# Patient Record
Sex: Female | Born: 1960 | Race: Black or African American | Hispanic: No | Marital: Single | Smoking: Never smoker
Health system: Southern US, Community
[De-identification: ages and names within clinical notes are randomized; demographics above are authoritative.]

## PROBLEM LIST (undated history)

## (undated) DIAGNOSIS — F32A Depression, unspecified: Secondary | ICD-10-CM

## (undated) DIAGNOSIS — T7840XA Allergy, unspecified, initial encounter: Secondary | ICD-10-CM

## (undated) DIAGNOSIS — R7303 Prediabetes: Secondary | ICD-10-CM

## (undated) DIAGNOSIS — I219 Acute myocardial infarction, unspecified: Secondary | ICD-10-CM

## (undated) DIAGNOSIS — I10 Essential (primary) hypertension: Secondary | ICD-10-CM

## (undated) DIAGNOSIS — C801 Malignant (primary) neoplasm, unspecified: Secondary | ICD-10-CM

## (undated) DIAGNOSIS — F329 Major depressive disorder, single episode, unspecified: Secondary | ICD-10-CM

## (undated) DIAGNOSIS — K219 Gastro-esophageal reflux disease without esophagitis: Secondary | ICD-10-CM

## (undated) DIAGNOSIS — G4733 Obstructive sleep apnea (adult) (pediatric): Secondary | ICD-10-CM

## (undated) DIAGNOSIS — G4730 Sleep apnea, unspecified: Secondary | ICD-10-CM

## (undated) DIAGNOSIS — E119 Type 2 diabetes mellitus without complications: Secondary | ICD-10-CM

## (undated) DIAGNOSIS — R011 Cardiac murmur, unspecified: Secondary | ICD-10-CM

## (undated) DIAGNOSIS — M170 Bilateral primary osteoarthritis of knee: Secondary | ICD-10-CM

## (undated) DIAGNOSIS — H269 Unspecified cataract: Secondary | ICD-10-CM

## (undated) DIAGNOSIS — N189 Chronic kidney disease, unspecified: Secondary | ICD-10-CM

## (undated) HISTORY — DX: Allergy, unspecified, initial encounter: T78.40XA

## (undated) HISTORY — PX: TUBAL LIGATION: SHX77

## (undated) HISTORY — PX: JOINT REPLACEMENT: SHX530

## (undated) HISTORY — DX: Sleep apnea, unspecified: G47.30

## (undated) HISTORY — DX: Malignant (primary) neoplasm, unspecified: C80.1

## (undated) HISTORY — PX: TOTAL KNEE ARTHROPLASTY: SHX125

## (undated) HISTORY — PX: ANKLE SURGERY: SHX546

## (undated) HISTORY — PX: EYE SURGERY: SHX253

## (undated) HISTORY — PX: MEDIAL PARTIAL KNEE REPLACEMENT: SHX5965

## (undated) HISTORY — PX: BREAST SURGERY: SHX581

---

## 2004-08-26 ENCOUNTER — Emergency Department: Payer: Self-pay | Admitting: Emergency Medicine

## 2004-09-28 ENCOUNTER — Ambulatory Visit: Payer: Self-pay | Admitting: Specialist

## 2005-09-17 ENCOUNTER — Emergency Department: Payer: Self-pay | Admitting: Emergency Medicine

## 2006-07-22 ENCOUNTER — Emergency Department: Payer: Self-pay | Admitting: Emergency Medicine

## 2006-11-14 ENCOUNTER — Emergency Department: Payer: Self-pay | Admitting: General Practice

## 2007-02-27 ENCOUNTER — Ambulatory Visit: Payer: Self-pay | Admitting: Specialist

## 2007-03-07 ENCOUNTER — Emergency Department: Payer: Self-pay | Admitting: Internal Medicine

## 2007-04-17 ENCOUNTER — Emergency Department: Payer: Self-pay | Admitting: Emergency Medicine

## 2007-05-31 ENCOUNTER — Other Ambulatory Visit: Payer: Self-pay

## 2007-05-31 ENCOUNTER — Ambulatory Visit: Payer: Self-pay | Admitting: Specialist

## 2007-06-05 ENCOUNTER — Ambulatory Visit: Payer: Self-pay | Admitting: Specialist

## 2007-10-18 ENCOUNTER — Other Ambulatory Visit: Payer: Self-pay

## 2007-10-18 ENCOUNTER — Emergency Department: Payer: Self-pay | Admitting: Emergency Medicine

## 2007-12-19 DIAGNOSIS — M1710 Unilateral primary osteoarthritis, unspecified knee: Secondary | ICD-10-CM | POA: Insufficient documentation

## 2009-06-14 ENCOUNTER — Emergency Department: Payer: Self-pay | Admitting: Emergency Medicine

## 2010-01-21 ENCOUNTER — Ambulatory Visit: Payer: Self-pay | Admitting: Internal Medicine

## 2011-10-25 DIAGNOSIS — I152 Hypertension secondary to endocrine disorders: Secondary | ICD-10-CM | POA: Diagnosis present

## 2011-10-25 DIAGNOSIS — I10 Essential (primary) hypertension: Secondary | ICD-10-CM | POA: Insufficient documentation

## 2013-05-22 HISTORY — PX: COLONOSCOPY: SHX174

## 2013-12-22 ENCOUNTER — Ambulatory Visit: Payer: Self-pay | Admitting: Internal Medicine

## 2013-12-26 ENCOUNTER — Ambulatory Visit: Payer: Self-pay | Admitting: Internal Medicine

## 2014-01-07 DIAGNOSIS — Z6841 Body Mass Index (BMI) 40.0 and over, adult: Secondary | ICD-10-CM | POA: Insufficient documentation

## 2014-02-17 ENCOUNTER — Ambulatory Visit: Payer: Self-pay

## 2014-04-29 ENCOUNTER — Emergency Department: Payer: Self-pay | Admitting: Emergency Medicine

## 2014-04-29 LAB — URINALYSIS, COMPLETE
Bacteria: NONE SEEN
Bilirubin,UR: NEGATIVE
Glucose,UR: NEGATIVE mg/dL (ref 0–75)
Ketone: NEGATIVE
LEUKOCYTE ESTERASE: NEGATIVE
Nitrite: NEGATIVE
Ph: 5 (ref 4.5–8.0)
Protein: NEGATIVE
SPECIFIC GRAVITY: 1.016 (ref 1.003–1.030)

## 2014-06-03 DIAGNOSIS — F32A Depression, unspecified: Secondary | ICD-10-CM | POA: Insufficient documentation

## 2014-06-03 DIAGNOSIS — F329 Major depressive disorder, single episode, unspecified: Secondary | ICD-10-CM | POA: Insufficient documentation

## 2014-06-21 ENCOUNTER — Emergency Department: Payer: Self-pay | Admitting: Emergency Medicine

## 2014-06-21 LAB — COMPREHENSIVE METABOLIC PANEL
Albumin: 3.3 g/dL — ABNORMAL LOW (ref 3.4–5.0)
Alkaline Phosphatase: 103 U/L (ref 46–116)
Anion Gap: 6 — ABNORMAL LOW (ref 7–16)
BUN: 19 mg/dL — ABNORMAL HIGH (ref 7–18)
Bilirubin,Total: 0.2 mg/dL (ref 0.2–1.0)
Calcium, Total: 9.1 mg/dL (ref 8.5–10.1)
Chloride: 103 mmol/L (ref 98–107)
Co2: 30 mmol/L (ref 21–32)
Creatinine: 1.23 mg/dL (ref 0.60–1.30)
EGFR (African American): 59 — ABNORMAL LOW
EGFR (Non-African Amer.): 49 — ABNORMAL LOW
Glucose: 137 mg/dL — ABNORMAL HIGH (ref 65–99)
Osmolality: 282 (ref 275–301)
Potassium: 3 mmol/L — ABNORMAL LOW (ref 3.5–5.1)
SGOT(AST): 44 U/L — ABNORMAL HIGH (ref 15–37)
SGPT (ALT): 26 U/L (ref 14–63)
Sodium: 139 mmol/L (ref 136–145)
Total Protein: 8.4 g/dL — ABNORMAL HIGH (ref 6.4–8.2)

## 2014-06-21 LAB — CBC
HCT: 38.8 % (ref 35.0–47.0)
HGB: 12.7 g/dL (ref 12.0–16.0)
MCH: 28.7 pg (ref 26.0–34.0)
MCHC: 32.8 g/dL (ref 32.0–36.0)
MCV: 88 fL (ref 80–100)
Platelet: 174 10*3/uL (ref 150–440)
RBC: 4.43 10*6/uL (ref 3.80–5.20)
RDW: 14.2 % (ref 11.5–14.5)
WBC: 7.3 10*3/uL (ref 3.6–11.0)

## 2014-06-21 LAB — PROTIME-INR
INR: 1
Prothrombin Time: 13.4 secs

## 2014-06-21 LAB — D-DIMER(ARMC): D-Dimer: 1214 ng/ml

## 2014-06-21 LAB — TROPONIN I
Troponin-I: <0.02 ng/mL
Troponin-I: <0.02 ng/mL

## 2014-06-21 LAB — CK TOTAL AND CKMB (NOT AT ARMC)
CK, Total: 97 U/L (ref 26–192)
CK-MB: 0.8 ng/mL (ref 0.5–3.6)

## 2014-06-21 LAB — APTT: Activated PTT: 25.8 secs (ref 23.6–35.9)

## 2014-08-05 ENCOUNTER — Emergency Department: Payer: Self-pay | Admitting: Emergency Medicine

## 2014-09-24 ENCOUNTER — Other Ambulatory Visit: Payer: Self-pay | Admitting: Internal Medicine

## 2014-09-24 DIAGNOSIS — Z1239 Encounter for other screening for malignant neoplasm of breast: Secondary | ICD-10-CM

## 2014-09-29 ENCOUNTER — Encounter: Payer: Self-pay | Admitting: Emergency Medicine

## 2014-09-29 ENCOUNTER — Emergency Department: Payer: Medicare Other

## 2014-09-29 ENCOUNTER — Emergency Department
Admission: EM | Admit: 2014-09-29 | Discharge: 2014-09-29 | Disposition: A | Discharge status: Home / Self Care | Payer: Medicare Other | Attending: Emergency Medicine | Admitting: Emergency Medicine

## 2014-09-29 DIAGNOSIS — J180 Bronchopneumonia, unspecified organism: Secondary | ICD-10-CM | POA: Insufficient documentation

## 2014-09-29 DIAGNOSIS — I10 Essential (primary) hypertension: Secondary | ICD-10-CM | POA: Insufficient documentation

## 2014-09-29 DIAGNOSIS — R61 Generalized hyperhidrosis: Secondary | ICD-10-CM

## 2014-09-29 DIAGNOSIS — J029 Acute pharyngitis, unspecified: Secondary | ICD-10-CM | POA: Diagnosis present

## 2014-09-29 HISTORY — DX: Essential (primary) hypertension: I10

## 2014-09-29 MED ORDER — BENZONATATE 100 MG PO CAPS
ORAL_CAPSULE | ORAL | Status: AC
  Filled 2014-09-29: qty 2

## 2014-09-29 MED ORDER — PSEUDOEPH-BROMPHEN-DM 30-2-10 MG/5ML PO SYRP: 5.0000 mL | ORAL_SOLUTION | Freq: Four times a day (QID) | ORAL | Status: DC | PRN

## 2014-09-29 MED ORDER — ALBUTEROL SULFATE HFA 108 (90 BASE) MCG/ACT IN AERS: 2.0000 | INHALATION_SPRAY | Freq: Four times a day (QID) | RESPIRATORY_TRACT | Status: DC | PRN

## 2014-09-29 MED ORDER — BENZONATATE 100 MG PO CAPS
200.0000 mg | ORAL_CAPSULE | Freq: Once | ORAL | Status: AC
  Administered 2014-09-29: 200 mg via ORAL

## 2014-09-29 MED ORDER — AZITHROMYCIN 500 MG PO TABS: 500.0000 mg | ORAL_TABLET | Freq: Every day | ORAL | Status: AC

## 2014-09-29 NOTE — ED Notes (Signed)
Pt to ed with c/o body aches, chills, sore throat and cough since Friday.

## 2014-09-29 NOTE — Discharge Instructions (Signed)
Pneumonia, Adult Pneumonia is an infection of the lungs. It may be caused by a germ (virus or bacteria). Some types of pneumonia can spread easily from person to person. This can happen when you cough or sneeze. HOME CARE  Only take medicine as told by your doctor.  Take your medicine (antibiotics) as told. Finish it even if you start to feel better.  Do not smoke.  You may use a vaporizer or humidifier in your room. This can help loosen thick spit (mucus).  Sleep so you are almost sitting up (semi-upright). This helps reduce coughing.  Rest. A shot (vaccine) can help prevent pneumonia. Shots are often advised for:  People over 53 years old.  Patients on chemotherapy.  People with long-term (chronic) lung problems.  People with immune system problems. GET HELP RIGHT AWAY IF:   You are getting worse.  You cannot control your cough, and you are losing sleep.  You cough up blood.  Your pain gets worse, even with medicine.  You have a fever.  Any of your problems are getting worse, not better.  You have shortness of breath or chest pain. MAKE SURE YOU:   Understand these instructions.  Will watch your condition.  Will get help right away if you are not doing well or get worse. Document Released: 10/25/2007 Document Revised: 07/31/2011 Document Reviewed: 07/29/2010 [facility] Patient Information 2015 [place], [place]. This information is not intended to replace advice given to you by your health care provider. Make sure you discuss any questions you have with your health care provider.  Pneumonia, Adult Pneumonia is an infection of the lungs. It may be caused by a germ (virus or bacteria). Some types of pneumonia can spread easily from person to person. This can happen when you cough or sneeze. HOME CARE  Only take medicine as told by your doctor.  Take your medicine (antibiotics) as told. Finish it even if you start to feel better.  Do not smoke.  You may use a  vaporizer or humidifier in your room. This can help loosen thick spit (mucus).  Sleep so you are almost sitting up (semi-upright). This helps reduce coughing.  Rest. A shot (vaccine) can help prevent pneumonia. Shots are often advised for:  People over 53 years old.  Patients on chemotherapy.  People with long-term (chronic) lung problems.  People with immune system problems. GET HELP RIGHT AWAY IF:   You are getting worse.  You cannot control your cough, and you are losing sleep.  You cough up blood.  Your pain gets worse, even with medicine.  You have a fever.  Any of your problems are getting worse, not better.  You have shortness of breath or chest pain. MAKE SURE YOU:   Understand these instructions.  Will watch your condition.  Will get help right away if you are not doing well or get worse. Document Released: 10/25/2007 Document Revised: 07/31/2011 Document Reviewed: 07/29/2010 [facility] Patient Information 2015 [place], [place]. This information is not intended to replace advice given to you by your health care provider. Make sure you discuss any questions you have with your health care provider.

## 2014-09-29 NOTE — ED Provider Notes (Signed)
[facility] Emergency Department Provider Note  ____________________________________________  Time seen: Approximately 11:03 AM  I have reviewed the triage vital signs and the nursing notes.   HISTORY  Chief Complaint Cough and Sore Throat    HPI Leslie Bautista is a 54 y.o. female plane of 3-4 days of a nonproductive cough. He states also sore throat. He states that she has chest pain secondary to the cough. He is also concern of developing night sweats for the past 3 days. States she got caught out in the rain 4 days ago and developed a cough. She denies any fever or sinus congestion. She rates the pain as 8/10. The pain is confined mostly to the chest. Has not taken any medication for her complaint.  Past Medical History  Diagnosis Date  . Hypertension     There are no active problems to display for this patient.   Past Surgical History  Procedure Laterality Date  . Medial partial knee replacement Right     No current outpatient prescriptions on file.  Allergies Oxycontin  History reviewed. No pertinent family history.  Social History History  Substance Use Topics  . Smoking status: Never Smoker   . Smokeless tobacco: Never Used  . Alcohol Use: No    Review of Systems Constitutional: Denies fever but has chills. She is a question of warm blanket. Eyes: No visual changes. ENT: States so through secondary to continuous coughing. Cardiovascular: Chest pain secondary to continue coughing.  Respiratory: Denies shortness of breath. Gastrointestinal: No abdominal pain.  No nausea, no vomiting.  No diarrhea.  No constipation. Genitourinary: Negative for dysuria. Musculoskeletal: States back pain secondary to cough. Skin: Negative for rash. Neurological: Negative for headaches, focal weakness or numbness. Psychiatric:Negative Endocrine:Negative Hematological/Lymphatic:Negative Allergic/Immunilogical: OxyContin. 10-point ROS otherwise  negative.  ____________________________________________   PHYSICAL EXAM:  VITAL SIGNS: ED Triage Vitals  Enc Vitals Group     BP 09/29/14 1027 170/104 mmHg     Pulse Rate 09/29/14 1027 62     Resp 09/29/14 1027 18     Temp 09/29/14 1027 97.8 F (36.6 C)     Temp Source 09/29/14 1027 Oral     SpO2 09/29/14 1027 95 %     Weight 09/29/14 1027 281 lb (127.461 kg)     Height 09/29/14 1027 5\' 3"  (1.6 m)     Head Cir --      Peak Flow --      Pain Score 09/29/14 1028 8     Pain Loc --      Pain Edu? --      Excl. in GC? --     Constitutional: Alert and oriented. Well appearing and in mild distress. Eyes: Conjunctivae are normal. PERRL. EOMI. Head: Atraumatic. Nose: No congestion/rhinnorhea. Mouth/Throat: Mucous membranes are moist.  Oropharynx non-erythematous. Neck: No stridor Hematological/Lymphatic/Immunilogical: No cervical lymphadenopathy. Cardiovascular: Normal rate, regular rhythm. Grossly normal heart sounds.  Good peripheral circulation. Respiratory: Normal respiratory effort.  No retractions. Lungs CTAB. Gastrointestinal: Soft and nontender. No distention. No abdominal bruits. No CVA tenderness. Genitourinary: Not examined Musculoskeletal: No lower extremity tenderness nor edema.  No joint effusions. Neurologic:  Normal speech and language. No gross focal neurologic deficits are appreciated. Speech is normal. No gait instability. Skin:  Skin is warm, dry and intact. No rash noted. Psychiatric: Mood and affect are normal. Speech and behavior are normal.  ____________________________________________   LABS (all labs ordered are listed, but only abnormal results are displayed)  Labs Reviewed - No  data to display ____________________________________________  EKG   ____________________________________________  RADIOLOGY   ____________________________________________   PROCEDURES  Procedure(s) performed: None  Critical Care performed:  No  ____________________________________________   INITIAL IMPRESSION / ASSESSMENT AND PLAN / ED COURSE  Pertinent labs & imaging results that were available during my care of the patient were reviewed by me and considered in my medical decision making (see chart for details).  bronchitis ____________________________________________   FINAL CLINICAL IMPRESSION(S) / ED DIAGNOSES  Final diagnoses:  Night sweats  Bronchopneumonia      Joni ReiningRonald K Smith, PA-C 09/29/14 1250  Sharman CheekPhillip Stafford, MD 09/30/14 [phone]

## 2014-09-29 NOTE — ED Notes (Signed)
Patient states she has been having night sweats.

## 2015-10-19 ENCOUNTER — Encounter: Payer: Self-pay | Admitting: Emergency Medicine

## 2015-10-19 DIAGNOSIS — R112 Nausea with vomiting, unspecified: Secondary | ICD-10-CM | POA: Insufficient documentation

## 2015-10-19 DIAGNOSIS — F329 Major depressive disorder, single episode, unspecified: Secondary | ICD-10-CM | POA: Diagnosis not present

## 2015-10-19 DIAGNOSIS — R197 Diarrhea, unspecified: Secondary | ICD-10-CM | POA: Insufficient documentation

## 2015-10-19 DIAGNOSIS — Z79899 Other long term (current) drug therapy: Secondary | ICD-10-CM | POA: Diagnosis not present

## 2015-10-19 DIAGNOSIS — I10 Essential (primary) hypertension: Secondary | ICD-10-CM | POA: Insufficient documentation

## 2015-10-19 DIAGNOSIS — E876 Hypokalemia: Secondary | ICD-10-CM | POA: Insufficient documentation

## 2015-10-19 DIAGNOSIS — R1084 Generalized abdominal pain: Secondary | ICD-10-CM | POA: Diagnosis present

## 2015-10-19 LAB — COMPREHENSIVE METABOLIC PANEL
ALT: 146 U/L — ABNORMAL HIGH (ref 14–54)
AST: 81 U/L — ABNORMAL HIGH (ref 15–41)
Albumin: 4.3 g/dL (ref 3.5–5.0)
Alkaline Phosphatase: 146 U/L — ABNORMAL HIGH (ref 38–126)
Anion gap: 10 (ref 5–15)
BUN: 9 mg/dL (ref 6–20)
CHLORIDE: 96 mmol/L — AB (ref 101–111)
CO2: 29 mmol/L (ref 22–32)
CREATININE: 1 mg/dL (ref 0.44–1.00)
Calcium: 10 mg/dL (ref 8.9–10.3)
GFR calc Af Amer: >60 mL/min (ref >60)
GFR calc non Af Amer: >60 mL/min (ref >60)
Glucose, Bld: 132 mg/dL — ABNORMAL HIGH (ref 65–99)
Potassium: 2.7 mmol/L — CL (ref 3.5–5.1)
Sodium: 135 mmol/L (ref 135–145)
TOTAL PROTEIN: 9.1 g/dL — AB (ref 6.5–8.1)
Total Bilirubin: 0.9 mg/dL (ref 0.3–1.2)

## 2015-10-19 LAB — URINALYSIS COMPLETE WITH MICROSCOPIC (ARMC ONLY)
BILIRUBIN URINE: NEGATIVE
Glucose, UA: NEGATIVE mg/dL
HGB URINE DIPSTICK: NEGATIVE
NITRITE: NEGATIVE
PH: 7 (ref 5.0–8.0)
Protein, ur: NEGATIVE mg/dL
SPECIFIC GRAVITY, URINE: 1.014 (ref 1.005–1.030)

## 2015-10-19 LAB — CBC
HCT: 40.1 % (ref 35.0–47.0)
Hemoglobin: 13.9 g/dL (ref 12.0–16.0)
MCH: 29.1 pg (ref 26.0–34.0)
MCHC: 34.7 g/dL (ref 32.0–36.0)
MCV: 84 fL (ref 80.0–100.0)
PLATELETS: 183 10*3/uL (ref 150–440)
RBC: 4.78 MIL/uL (ref 3.80–5.20)
RDW: 14.4 % (ref 11.5–14.5)
WBC: 8.1 10*3/uL (ref 3.6–11.0)

## 2015-10-19 LAB — LIPASE, BLOOD: LIPASE: 13 U/L (ref 11–51)

## 2015-10-19 NOTE — ED Notes (Addendum)
Pt ambulated to triage room with steady gait. Pt dx with "vaginal infection" and prescribed antibiotics on 10/15/15. Reports Sunday had diarrhea and upper abdominal pain. Pt reports vomited x 5 today, reports has not been eating or drinking today. Stats "it tastes like metallic, like the medicine." Pt denies chest pain or fevers. Pt speaking in complete sentences, respirations even and unlabored.

## 2015-10-20 ENCOUNTER — Emergency Department
Admission: EM | Admit: 2015-10-20 | Discharge: 2015-10-20 | Disposition: A | Discharge status: Home / Self Care | Payer: Medicare Other | Attending: Emergency Medicine | Admitting: Emergency Medicine

## 2015-10-20 ENCOUNTER — Emergency Department: Payer: Medicare Other

## 2015-10-20 DIAGNOSIS — R197 Diarrhea, unspecified: Secondary | ICD-10-CM | POA: Diagnosis not present

## 2015-10-20 DIAGNOSIS — R1084 Generalized abdominal pain: Secondary | ICD-10-CM

## 2015-10-20 DIAGNOSIS — R112 Nausea with vomiting, unspecified: Secondary | ICD-10-CM

## 2015-10-20 DIAGNOSIS — E876 Hypokalemia: Secondary | ICD-10-CM

## 2015-10-20 HISTORY — DX: Depression, unspecified: F32.A

## 2015-10-20 HISTORY — DX: Major depressive disorder, single episode, unspecified: F32.9

## 2015-10-20 MED ORDER — POTASSIUM CHLORIDE 2 MEQ/ML IV SOLN
INTRAVENOUS | Status: DC
  Administered 2015-10-20: 03:00:00 via INTRAVENOUS
  Filled 2015-10-20 (×2): qty 1000

## 2015-10-20 MED ORDER — SODIUM CHLORIDE 0.9 % IV BOLUS (SEPSIS)
1000.0000 mL | Freq: Once | INTRAVENOUS | Status: AC
  Administered 2015-10-20: 1000 mL via INTRAVENOUS

## 2015-10-20 MED ORDER — DICYCLOMINE HCL 20 MG PO TABS
20.0000 mg | ORAL_TABLET | Freq: Four times a day (QID) | ORAL | Status: DC | PRN
Start: 2015-10-20 — End: 2018-07-24

## 2015-10-20 MED ORDER — DIPHENHYDRAMINE HCL 50 MG/ML IJ SOLN
12.5000 mg | Freq: Once | INTRAMUSCULAR | Status: AC
  Administered 2015-10-20: 12.5 mg via INTRAVENOUS

## 2015-10-20 MED ORDER — MORPHINE SULFATE (PF) 4 MG/ML IV SOLN
4.0000 mg | Freq: Once | INTRAVENOUS | Status: AC
  Administered 2015-10-20: 4 mg via INTRAVENOUS

## 2015-10-20 MED ORDER — IOPAMIDOL (ISOVUE-300) INJECTION 61%
125.0000 mL | Freq: Once | INTRAVENOUS | Status: AC | PRN
  Administered 2015-10-20: 125 mL via INTRAVENOUS

## 2015-10-20 MED ORDER — ONDANSETRON HCL 4 MG/2ML IJ SOLN
4.0000 mg | Freq: Once | INTRAMUSCULAR | Status: AC
  Administered 2015-10-20: 4 mg via INTRAVENOUS

## 2015-10-20 MED ORDER — ONDANSETRON 4 MG PO TBDP: 4.0000 mg | ORAL_TABLET | Freq: Three times a day (TID) | ORAL | Status: DC | PRN

## 2015-10-20 MED ORDER — DIATRIZOATE MEGLUMINE & SODIUM 66-10 % PO SOLN
15.0000 mL | Freq: Once | ORAL | Status: AC
  Administered 2015-10-20: 15 mL via ORAL

## 2015-10-20 NOTE — ED Notes (Signed)
Pt up to RN desk, reports feeling numbness and tingling in bilateral arms and legs that just started. Pt vomiting in triage room. Emesis bag provided to patient.

## 2015-10-20 NOTE — ED Provider Notes (Signed)
[facility] Emergency Department Provider Note   ____________________________________________  Time seen: Approximately 2:11 AM  I have reviewed the triage vital signs and the nursing notes.   HISTORY  Chief Complaint Abdominal Pain    HPI Carolanne GrumblingGaynelle Mcclean is a 55 y.o. female who presents to the ED from home with a chief complain of abdominal pain, nausea, vomiting and diarrhea. Patient started Flagyl on 10/15/15 for bacterial vaginosis. Onset of abdominal pain the following day associated with diarrhea. Diarrhea stopped on 5/29; now with N/V 5. Denies associated fever, chills, chest pain, shortness of breath, dysuria. Denies recent travel or trauma.   Past Medical History  Diagnosis Date  . Hypertension   . Depression     There are no active problems to display for this patient.   Past Surgical History  Procedure Laterality Date  . Medial partial knee replacement Right     Current Outpatient Rx  Name  Route  Sig  Dispense  Refill  . amLODipine (NORVASC) 10 MG tablet   Oral   Take 10 mg by mouth daily.         . chlorthalidone (HYGROTON) 25 MG tablet   Oral   Take 25 mg by mouth daily.         Marland Kitchen. FLUoxetine (PROZAC) 20 MG capsule   Oral   Take 20 mg by mouth daily.         . polyethylene glycol (MIRALAX / GLYCOLAX) packet   Oral   Take 17 g by mouth daily.         . SUMAtriptan (IMITREX) 50 MG tablet   Oral   Take 50 mg by mouth as needed.         . topiramate (TOPAMAX) 25 MG tablet   Oral   Take 25 mg by mouth daily.         . traZODone (DESYREL) 100 MG tablet   Oral   Take 100 mg by mouth at bedtime.           Allergies Oxycontin  No family history on file.  Social History Social History  Substance Use Topics  . Smoking status: Never Smoker   . Smokeless tobacco: Never Used  . Alcohol Use: No    Review of Systems  Constitutional: No fever/chills. Eyes: No visual changes. ENT: No sore  throat. Cardiovascular: Denies chest pain. Respiratory: Denies shortness of breath. Gastrointestinal: Positive for abdominal pain, nausea, vomiting and diarrhea.  No constipation. Genitourinary: Negative for dysuria. Musculoskeletal: Negative for back pain. Skin: Negative for rash. Neurological: Negative for headaches, focal weakness or numbness.  10-point ROS otherwise negative.  ____________________________________________   PHYSICAL EXAM:  VITAL SIGNS: ED Triage Vitals  Enc Vitals Group     BP 10/19/15 2042 142/71 mmHg     Pulse Rate 10/19/15 2042 67     Resp 10/19/15 2042 18     Temp 10/19/15 2042 97.8 F (36.6 C)     Temp Source 10/19/15 2042 Oral     SpO2 10/19/15 2042 100 %     Weight 10/19/15 2042 285 lb (129.275 kg)     Height 10/19/15 2042 5\' 3"  (1.6 m)     Head Cir --      Peak Flow --      Pain Score 10/19/15 2043 10     Pain Loc --      Pain Edu? --      Excl. in GC? --     Constitutional: Alert  and oriented. Well appearing and in no acute distress. Eyes: Conjunctivae are normal. PERRL. EOMI. Head: Atraumatic. Nose: No congestion/rhinnorhea. Mouth/Throat: Mucous membranes are moist.  Oropharynx non-erythematous. Neck: No stridor.   Cardiovascular: Normal rate, regular rhythm. Grossly normal heart sounds.  Good peripheral circulation. Respiratory: Normal respiratory effort.  No retractions. Lungs CTAB. Gastrointestinal: Obese. Soft and mild diffuse tenderness to palpation without rebound or guarding. No distention. No abdominal bruits. No CVA tenderness. Musculoskeletal: No lower extremity tenderness nor edema.  No joint effusions. Neurologic:  Normal speech and language. No gross focal neurologic deficits are appreciated. No gait instability. Skin:  Skin is warm, dry and intact. No rash noted. Psychiatric: Mood and affect are normal. Speech and behavior are normal.  ____________________________________________   LABS (all labs ordered are listed, but  only abnormal results are displayed)  Labs Reviewed  COMPREHENSIVE METABOLIC PANEL - Abnormal; Notable for the following:    Potassium 2.7 (*)    Chloride 96 (*)    Glucose, Bld 132 (*)    Total Protein 9.1 (*)    AST 81 (*)    ALT 146 (*)    Alkaline Phosphatase 146 (*)    All other components within normal limits  URINALYSIS COMPLETEWITH MICROSCOPIC ([facility] ONLY) - Abnormal; Notable for the following:    Color, Urine YELLOW (*)    APPearance HAZY (*)    Ketones, ur TRACE (*)    Leukocytes, UA TRACE (*)    Bacteria, UA RARE (*)    Squamous Epithelial / LPF 6-30 (*)    All other components within normal limits  LIPASE, BLOOD  CBC   ____________________________________________  EKG  None ____________________________________________  RADIOLOGY  CT abdomen/pelvis interpreted per Dr. Manus Gunning: 1. No acute abnormality in the abdomen/pelvis. 2. Mild atherosclerosis. Coronary artery calcifications are seen. ____________________________________________   PROCEDURES  Procedure(s) performed: None  Critical Care performed: No  ____________________________________________   INITIAL IMPRESSION / ASSESSMENT AND PLAN / ED COURSE  Pertinent labs & imaging results that were available during my care of the patient were reviewed by me and considered in my medical decision making (see chart for details).  55 year old female who presents with abdominal pain, nausea, vomiting, diarrhea after starting Flagyl. Diarrhea resolved 2 days ago; now with nausea and vomiting. Noted mild elevation of transaminases; however, given diffuse abdominal tenderness on exam will obtain CT abdomen/pelvis to evaluate for intra-abdominal pathology. IV fluids with KCl to be infused for hypokalemia.  ----------------------------------------- 5:00 AM on 10/20/2015 -----------------------------------------  Patient tolerated PO contrast without vomiting. Updated patient and friend of CT imaging results.  Patient states she is no longer experiencing vaginal discharge for which she was originally prescribed Flagyl. I have advised her to discontinue Flagyl altogether. Will write prescriptions for PRN Zofran and Bentyl, and patient will follow-up with PCP this week. Strict return precautions given. Patient verbalizes understanding and agrees with plan of care. ____________________________________________   FINAL CLINICAL IMPRESSION(S) / ED DIAGNOSES  Final diagnoses:  Generalized abdominal pain  Nausea vomiting and diarrhea  Hypokalemia      NEW MEDICATIONS STARTED DURING THIS VISIT:  New Prescriptions   No medications on file     Note:  This document was prepared using Dragon voice recognition software and may include unintentional dictation errors.    Irean Hong, MD 10/20/15 [phone]

## 2015-10-20 NOTE — Discharge Instructions (Signed)
1. Discontinue antibiotic. 2. You may take medicines as needed for abdominal cramping and nausea (Bentyl/Zofran #20). 3. Your liquids 12 hours, then BRAT diet for 3 days. Slowly advance diet as tolerated. 4. Return to the ER for worsening symptoms, persistent vomiting, difficult breathing or other concerns.  Abdominal Pain, Adult Many things can cause belly (abdominal) pain. Most times, the belly pain is not dangerous. Many cases of belly pain can be watched and treated at home. HOME CARE   Do not take medicines that help you go poop (laxatives) unless told to by your doctor.  Only take medicine as told by your doctor.  Eat or drink as told by your doctor. Your doctor will tell you if you should be on a special diet. GET HELP IF:  You do not know what is causing your belly pain.  You have belly pain while you are sick to your stomach (nauseous) or have runny poop (diarrhea).  You have pain while you pee or poop.  Your belly pain wakes you up at night.  You have belly pain that gets worse or better when you eat.  You have belly pain that gets worse when you eat fatty foods.  You have a fever. GET HELP RIGHT AWAY IF:   The pain does not go away within 2 hours.  You keep throwing up (vomiting).  The pain changes and is only in the right or left part of the belly.  You have bloody or tarry looking poop. MAKE SURE YOU:   Understand these instructions.  Will watch your condition.  Will get help right away if you are not doing well or get worse.   This information is not intended to replace advice given to you by your health care provider. Make sure you discuss any questions you have with your health care provider.   Document Released: 10/25/2007 Document Revised: 05/29/2014 Document Reviewed: 01/15/2013 Elsevier Interactive Patient Education 2016 [employer]vier Inc[employer]  Diarrhea Diarrhea is watery poop (stool). It can make you feel weak, tired, thirsty, or give you a dry mouth  (signs of dehydration). Watery poop is a sign of another problem, most often an infection. It often lasts 2-3 days. It can last longer if it is a sign of something serious. Take care of yourself as told by your doctor. HOME CARE   Drink 1 cup (8 ounces) of fluid each time you have watery poop.  Do not drink the following fluids:  Those that contain simple sugars (fructose, glucose, galactose, lactose, sucrose, maltose).  Sports drinks.  Fruit juices.  Whole milk products.  Sodas.  Drinks with caffeine (coffee, tea, soda) or alcohol.  Oral rehydration solution may be used if the doctor says it is okay. You may make your own solution. Follow this recipe:   - teaspoon table salt.   teaspoon baking soda.   teaspoon salt substitute containing potassium chloride.  1 tablespoons sugar.  1 liter (34 ounces) of water.  Avoid the following foods:  High fiber foods, such as raw fruits and vegetables.  Nuts, seeds, and whole grain breads and cereals.   Those that are sweetened with sugar alcohols (xylitol, sorbitol, mannitol).  Try eating the following foods:  Starchy foods, such as rice, toast, pasta, low-sugar cereal, oatmeal, baked potatoes, crackers, and bagels.  Bananas.  Applesauce.  Eat probiotic-rich foods, such as yogurt and milk products that are fermented.  Wash your hands well after each time you have watery poop.  Only take medicine as told  by your doctor.  Take a warm bath to help lessen burning or pain from having watery poop. GET HELP RIGHT AWAY IF:   You cannot drink fluids without throwing up (vomiting).  You keep throwing up.  You have blood in your poop, or your poop looks black and tarry.  You do not pee (urinate) in 6-8 hours, or there is only a small amount of very dark pee.  You have belly (abdominal) pain that gets worse or stays in the same spot (localizes).  You are weak, dizzy, confused, or light-headed.  You have a very bad  headache.  Your watery poop gets worse or does not get better.  You have a fever or lasting symptoms for more than 2-3 days.  You have a fever and your symptoms suddenly get worse. MAKE SURE YOU:   Understand these instructions.  Will watch your condition.  Will get help right away if you are not doing well or get worse.   This information is not intended to replace advice given to you by your health care provider. Make sure you discuss any questions you have with your health care provider.   Document Released: 10/25/2007 Document Revised: 05/29/2014 Document Reviewed: 01/14/2012 Elsevier Interactive Patient Education 2016 [employer].  Food Choices to Help Relieve Diarrhea, Adult When you have diarrhea, the foods you eat and your eating habits are very important. Choosing the right foods and drinks can help relieve diarrhea. Also, because diarrhea can last up to 7 days, you need to replace lost fluids and electrolytes (such as sodium, potassium, and chloride) in order to help prevent dehydration.  WHAT GENERAL GUIDELINES DO I NEED TO FOLLOW?  Slowly drink 1 cup (8 oz) of fluid for each episode of diarrhea. If you are getting enough fluid, your urine will be clear or pale yellow.  Eat starchy foods. Some good choices include white rice, white toast, pasta, low-fiber cereal, baked potatoes (without the skin), saltine crackers, and bagels.  Avoid large servings of any cooked vegetables.  Limit fruit to two servings per day. A serving is  cup or 1 small piece.  Choose foods with less than 2 g of fiber per serving.  Limit fats to less than 8 tsp (38 g) per day.  Avoid fried foods.  Eat foods that have probiotics in them. Probiotics can be found in certain dairy products.  Avoid foods and beverages that may increase the speed at which food moves through the stomach and intestines (gastrointestinal tract). Things to avoid include:  High-fiber foods, such as dried fruit, raw  fruits and vegetables, nuts, seeds, and whole grain foods.  Spicy foods and high-fat foods.  Foods and beverages sweetened with high-fructose corn syrup, honey, or sugar alcohols such as xylitol, sorbitol, and mannitol. WHAT FOODS ARE RECOMMENDED? Grains White rice. White, [place], or pita breads (fresh or toasted), including plain rolls, buns, or bagels. White pasta. Saltine, soda, or graham crackers. Pretzels. Low-fiber cereal. Cooked cereals made with water (such as cornmeal, farina, or cream cereals). Plain muffins. Matzo. Melba toast. Zwieback.  Vegetables Potatoes (without the skin). Strained tomato and vegetable juices. Most well-cooked and canned vegetables without seeds. Tender lettuce. Fruits Cooked or canned applesauce, apricots, cherries, fruit cocktail, grapefruit, peaches, pears, or plums. Fresh bananas, apples without skin, cherries, grapes, cantaloupe, grapefruit, peaches, oranges, or plums.  Meat and Other Protein Products Baked or boiled chicken. Eggs. Tofu. Fish. Seafood. Smooth peanut butter. Ground or well-cooked tender beef, ham, veal, lamb, pork, or poultry.  Dairy Plain yogurt, kefir, and unsweetened liquid yogurt. Lactose-free milk, buttermilk, or soy milk. Plain hard cheese. Beverages Sport drinks. Clear broths. Diluted fruit juices (except prune). Regular, caffeine-free sodas such as ginger ale. Water. Decaffeinated teas. Oral rehydration solutions. Sugar-free beverages not sweetened with sugar alcohols. Other Bouillon, broth, or soups made from recommended foods.  The items listed above may not be a complete list of recommended foods or beverages. Contact your dietitian for more options. WHAT FOODS ARE NOT RECOMMENDED? Grains Whole grain, whole wheat, bran, or rye breads, rolls, pastas, crackers, and cereals. Wild or brown rice. Cereals that contain more than 2 g of fiber per serving. Corn tortillas or taco shells. Cooked or dry oatmeal. Granola.  Popcorn. Vegetables Raw vegetables. Cabbage, broccoli, Brussels sprouts, artichokes, baked beans, beet greens, corn, kale, legumes, peas, sweet potatoes, and yams. Potato skins. Cooked spinach and cabbage. Fruits Dried fruit, including raisins and dates. Raw fruits. Stewed or dried prunes. Fresh apples with skin, apricots, mangoes, pears, raspberries, and strawberries.  Meat and Other Protein Products Chunky peanut butter. Nuts and seeds. Beans and lentils. Tomasa Blase.  Dairy High-fat cheeses. Milk, chocolate milk, and beverages made with milk, such as milk shakes. Cream. Ice cream. Sweets and Desserts Sweet rolls, doughnuts, and sweet breads. Pancakes and waffles. Fats and Oils Butter. Cream sauces. Margarine. Salad oils. Plain salad dressings. Olives. Avocados.  Beverages Caffeinated beverages (such as coffee, tea, soda, or energy drinks). Alcoholic beverages. Fruit juices with pulp. Prune juice. Soft drinks sweetened with high-fructose corn syrup or sugar alcohols. Other Coconut. Hot sauce. Chili powder. Mayonnaise. Gravy. Cream-based or milk-based soups.  The items listed above may not be a complete list of foods and beverages to avoid. Contact your dietitian for more information. WHAT SHOULD I DO IF I BECOME DEHYDRATED? Diarrhea can sometimes lead to dehydration. Signs of dehydration include dark urine and dry mouth and skin. If you think you are dehydrated, you should rehydrate with an oral rehydration solution. These solutions can be purchased at pharmacies, retail stores, or online.  Drink -1 cup (120-240 mL) of oral rehydration solution each time you have an episode of diarrhea. If drinking this amount makes your diarrhea worse, try drinking smaller amounts more often. For example, drink 1-3 tsp (5-15 mL) every 5-10 minutes.  A general rule for staying hydrated is to drink 1-2 L of fluid per day. Talk to your health care provider about the specific amount you should be drinking each day.  Drink enough fluids to keep your urine clear or pale yellow.   This information is not intended to replace advice given to you by your health care provider. Make sure you discuss any questions you have with your health care provider.   Document Released: 07/29/2003 Document Revised: 05/29/2014 Document Reviewed: 03/31/2013 Elsevier Interactive Patient Education 2016 [employer].  Hypokalemia Hypokalemia means that the amount of potassium in the blood is lower than normal.Potassium is a chemical, called an electrolyte, that helps regulate the amount of fluid in the body. It also stimulates muscle contraction and helps nerves function properly.Most of the body's potassium is inside of cells, and only a very small amount is in the blood. Because the amount in the blood is so small, minor changes can be life-threatening. CAUSES  Antibiotics.  Diarrhea or vomiting.  Using laxatives too much, which can cause diarrhea.  Chronic kidney disease.  Water pills (diuretics).  Eating disorders (bulimia).  Low magnesium level.  Sweating a lot. SIGNS AND SYMPTOMS  Weakness.  Constipation.  Fatigue.  Muscle cramps.  Mental confusion.  Skipped heartbeats or irregular heartbeat (palpitations).  Tingling or numbness. DIAGNOSIS  Your health care provider can diagnose hypokalemia with blood tests. In addition to checking your potassium level, your health care provider may also check other lab tests. TREATMENT Hypokalemia can be treated with potassium supplements taken by mouth or adjustments in your current medicines. If your potassium level is very low, you may need to get potassium through a vein (IV) and be monitored in the hospital. A diet high in potassium is also helpful. Foods high in potassium are:  Nuts, such as peanuts and pistachios.  Seeds, such as sunflower seeds and pumpkin seeds.  Peas, lentils, and lima beans.  Whole grain and bran cereals and breads.  Fresh fruit  and vegetables, such as apricots, avocado, bananas, cantaloupe, kiwi, oranges, tomatoes, asparagus, and potatoes.  Orange and tomato juices.  Red meats.  Fruit yogurt. HOME CARE INSTRUCTIONS  Take all medicines as prescribed by your health care provider.  Maintain a healthy diet by including nutritious food, such as fruits, vegetables, nuts, whole grains, and lean meats.  If you are taking a laxative, be sure to follow the directions on the label. SEEK MEDICAL CARE IF:  Your weakness gets worse.  You feel your heart pounding or racing.  You are vomiting or having diarrhea.  You are diabetic and having trouble keeping your blood glucose in the normal range. SEEK IMMEDIATE MEDICAL CARE IF:  You have chest pain, shortness of breath, or dizziness.  You are vomiting or having diarrhea for more than 2 days.  You faint. MAKE SURE YOU:   Understand these instructions.  Will watch your condition.  Will get help right away if you are not doing well or get worse.   This information is not intended to replace advice given to you by your health care provider. Make sure you discuss any questions you have with your health care provider.   Document Released: 05/08/2005 Document Revised: 05/29/2014 Document Reviewed: 11/08/2012 Elsevier Interactive Patient Education 2016 Elsevier Inc.  Nausea and Vomiting Nausea means you feel sick to your stomach. Throwing up (vomiting) is a reflex where stomach contents come out of your mouth. HOME CARE   Take medicine as told by your doctor.  Do not force yourself to eat. However, you do need to drink fluids.  If you feel like eating, eat a normal diet as told by your doctor.  Eat rice, wheat, potatoes, bread, lean meats, yogurt, fruits, and vegetables.  Avoid high-fat foods.  Drink enough fluids to keep your pee (urine) clear or pale yellow.  Ask your doctor how to replace body fluid losses (rehydrate). Signs of body fluid loss  (dehydration) include:  Feeling very thirsty.  Dry lips and mouth.  Feeling dizzy.  Dark pee.  Peeing less than normal.  Feeling confused.  Fast breathing or heart rate. GET HELP RIGHT AWAY IF:   You have blood in your throw up.  You have black or bloody poop (stool).  You have a bad headache or stiff neck.  You feel confused.  You have bad belly (abdominal) pain.  You have chest pain or trouble breathing.  You do not pee at least once every 8 hours.  You have cold, clammy skin.  You keep throwing up after 24 to 48 hours.  You have a fever. MAKE SURE YOU:   Understand these instructions.  Will watch your condition.  Will get help right away  if you are not doing well or get worse.   This information is not intended to replace advice given to you by your health care provider. Make sure you discuss any questions you have with your health care provider.   Document Released: 10/25/2007 Document Revised: 07/31/2011 Document Reviewed: 10/07/2010 Elsevier Interactive Patient Education [employer]

## 2015-10-20 NOTE — ED Notes (Signed)
AAOx3.  Skin warm and dry. NAD.  Ambulates with easy and steady gait.   

## 2015-11-22 DIAGNOSIS — R7303 Prediabetes: Secondary | ICD-10-CM | POA: Insufficient documentation

## 2017-02-23 ENCOUNTER — Encounter: Payer: Self-pay | Admitting: Emergency Medicine

## 2017-02-23 ENCOUNTER — Emergency Department: Payer: Medicare Other

## 2017-02-23 ENCOUNTER — Emergency Department
Admission: EM | Admit: 2017-02-23 | Discharge: 2017-02-23 | Disposition: A | Discharge status: Home / Self Care | Payer: Medicare Other | Attending: Emergency Medicine | Admitting: Emergency Medicine

## 2017-02-23 DIAGNOSIS — S82892A Other fracture of left lower leg, initial encounter for closed fracture: Secondary | ICD-10-CM | POA: Diagnosis not present

## 2017-02-23 DIAGNOSIS — W108XXA Fall (on) (from) other stairs and steps, initial encounter: Secondary | ICD-10-CM | POA: Diagnosis not present

## 2017-02-23 DIAGNOSIS — Y939 Activity, unspecified: Secondary | ICD-10-CM | POA: Diagnosis not present

## 2017-02-23 DIAGNOSIS — Z79899 Other long term (current) drug therapy: Secondary | ICD-10-CM | POA: Diagnosis not present

## 2017-02-23 DIAGNOSIS — M7731 Calcaneal spur, right foot: Secondary | ICD-10-CM | POA: Diagnosis not present

## 2017-02-23 DIAGNOSIS — I10 Essential (primary) hypertension: Secondary | ICD-10-CM | POA: Diagnosis not present

## 2017-02-23 DIAGNOSIS — Y999 Unspecified external cause status: Secondary | ICD-10-CM | POA: Insufficient documentation

## 2017-02-23 DIAGNOSIS — Y929 Unspecified place or not applicable: Secondary | ICD-10-CM | POA: Insufficient documentation

## 2017-02-23 DIAGNOSIS — M79672 Pain in left foot: Secondary | ICD-10-CM | POA: Diagnosis present

## 2017-02-23 MED ORDER — TRAMADOL HCL 50 MG PO TABS: 50.0000 mg | ORAL_TABLET | Freq: Four times a day (QID) | ORAL | 0 refills | Status: AC | PRN

## 2017-02-23 MED ORDER — NAPROXEN 500 MG PO TABS: 500.0000 mg | ORAL_TABLET | Freq: Two times a day (BID) | ORAL | Status: DC

## 2017-02-23 NOTE — Discharge Instructions (Signed)
ankle support for 2 weeks while ambulating.

## 2017-02-23 NOTE — ED Provider Notes (Signed)
[facility] Emergency Department Provider Note   ____________________________________________   First MD Initiated Contact with Patient 02/23/17 1139     (approximate)  I have reviewed the triage vital signs and the nursing notes.   HISTORY  Chief Complaint Ankle Pain    HPI Leslie Bautista is a 56 y.o. female patient complaining of right lateral ankle pain for 2 weeks. Patient state pain secondary to a fall downstairs. States pain radiates from the ankle to the calf. Patient denies chest pain or shortness of breath.Patient stated pain increase ambulation. Patient has significant medical care on date of injury. Rates pain as 8/10. Describes pain as "achy". No palliative measures for complaint.   Past Medical History:  Diagnosis Date  . Depression   . Hypertension     There are no active problems to display for this patient.   Past Surgical History:  Procedure Laterality Date  . MEDIAL PARTIAL KNEE REPLACEMENT Right     Prior to Admission medications   Medication Sig Start Date End Date Taking? Authorizing Provider  amLODipine (NORVASC) 10 MG tablet Take 10 mg by mouth daily. 08/25/15 08/24/16  [provider]  chlorthalidone (HYGROTON) 25 MG tablet Take 25 mg by mouth daily. 08/25/15 08/24/16  [provider]  dicyclomine (BENTYL) 20 MG tablet Take 1 tablet (20 mg total) by mouth every 6 (six) hours as needed. 10/20/15   Irean Hong, MD  FLUoxetine (PROZAC) 20 MG capsule Take 20 mg by mouth daily. 08/25/15 08/24/16  [provider]  naproxen (NAPROSYN) 500 MG tablet Take 1 tablet (500 mg total) by mouth 2 (two) times daily with a meal. 02/23/17   Joni Reining, PA-C  ondansetron (ZOFRAN ODT) 4 MG disintegrating tablet Take 1 tablet (4 mg total) by mouth every 8 (eight) hours as needed for nausea or vomiting. 10/20/15   Irean Hong, MD  polyethylene glycol (MIRALAX / Ethelene Hal) packet Take 17 g by mouth daily. 08/25/15   [provider]  SUMAtriptan (IMITREX) 50 MG tablet Take 50 mg by mouth as needed. 07/26/15 07/26/16  [provider]  topiramate (TOPAMAX) 25 MG tablet Take 25 mg by mouth daily. 08/25/15   [provider]  traMADol (ULTRAM) 50 MG tablet Take 1 tablet (50 mg total) by mouth every 6 (six) hours as needed. 02/23/17 02/23/18  Joni Reining, PA-C  traZODone (DESYREL) 100 MG tablet Take 100 mg by mouth at bedtime. 08/25/15   [provider]    Allergies Oxycontin [oxycodone hcl]  No family history on file.  Social History Social History  Substance Use Topics  . Smoking status: Never Smoker  . Smokeless tobacco: Never Used  . Alcohol use No    Review of Systems  Constitutional: No fever/chills Eyes: No visual changes. ENT: No sore throat. Cardiovascular: Denies chest pain. Respiratory: Denies shortness of breath. Gastrointestinal: No abdominal pain.  No nausea, no vomiting.  No diarrhea.  No constipation. Genitourinary: Negative for dysuria. Musculoskeletal: Right ankle pain. Skin: Negative for rash. Neurological: Negative for headaches, focal weakness or numbness. Psychiatric:Depression Endocrine:Hypertension Allergic/Immunilogical:  Oxycodone  ____________________________________________   PHYSICAL EXAM:  VITAL SIGNS: ED Triage Vitals  Enc Vitals Group     BP 02/23/17 1117 (!) 181/80     Pulse Rate 02/23/17 1117 (!) 53     Resp 02/23/17 1117 18     Temp 02/23/17 1117 99.1 F (37.3 C)     Temp Source 02/23/17 1117 Oral  SpO2 02/23/17 1117 97 %     Weight 02/23/17 1104 285 lb (129.3 kg)     Height 02/23/17 1104  (1.6 m)     Head Circumference --      Peak Flow --      Pain Score 02/23/17 1104 8     Pain Loc --      Pain Edu? --      Excl. in GC? --     Constitutional: Alert and oriented. Well appearing and in no acute distress. Cardiovascular: Normal rate, regular rhythm. Grossly normal heart sounds.  Good peripheral circulation.  Elevated blood pressure Respiratory: Normal respiratory effort.  No retractions. Lungs CTAB. Musculoskeletal: deformity to the right ankle. Mild edema. Moderate guarding palpation distal fibular. Full nuchal range of motion.  Neurologic:  Normal speech and language. No gross focal neurologic deficits are appreciated. No gait instability. Skin:  Skin is warm, dry and intact. No rash noted. Psychiatric: Mood and affect are normal. Speech and behavior are normal.  ____________________________________________   LABS (all labs ordered are listed, but only abnormal results are displayed)  Labs Reviewed - No data to display ____________________________________________  EKG   ____________________________________________  RADIOLOGY  Dg Ankle Complete Right  Result Date: 02/23/2017 CLINICAL DATA:  Larey Seat several weeks ago. Persistent pain with weight-bearing. EXAM: RIGHT ANKLE - COMPLETE 3+ VIEW COMPARISON:  None. FINDINGS: Somewhat unusual linear calcification projected just at the anterior edge of the ankle joint on the lateral view, possibly relates to the tibial fibular articulation. I suspect of this is chronic but cannot rule out the possibility of an avulsion fracture. Medial and lateral malleolus do not show recent injury. There are prominent calcaneal spurs. 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 Less likely avulsion fracture. Is the patient tender in that location? Electronically Signed   By: Paulina Fusi M.D.   On: 02/23/2017 11:58    _X-ray findings consistent with ulcerative fracture of the distal fibula. Patient also has degenerative changes and heel spur to the right ankle. ___________________________________________   PROCEDURES  Procedure(s) performed: None  Procedures  Critical Care performed:  No  ____________________________________________   INITIAL IMPRESSION / ASSESSMENT AND PLAN / ED COURSE  @  Right ankle pain secondary to an avulsion fracture. Patient also has heel spur and multiple  degenerative changes. Discussed x-ray finding with patient. Patient given discharge care instruction. Patient placed in a ankle stirrup splint and advised to follow her PCP for continual care.      ____________________________________________   FINAL CLINICAL IMPRESSION(S) / ED DIAGNOSES  Final diagnoses:  Avulsion fracture of ankle, left, closed, initial encounter  Heel spur, right      NEW MEDICATIONS STARTED DURING THIS VISIT:  New Prescriptions   NAPROXEN (NAPROSYN) 500 MG TABLET    Take 1 tablet (500 mg total) by mouth 2 (two) times daily with a meal.   TRAMADOL (ULTRAM) 50 MG TABLET    Take 1 tablet (50 mg total) by mouth every 6 (six) hours as needed.     Note:  This document was prepared using Dragon voice recognition software and may include unintentional dictation errors.    Joni Reining, PA-C 02/23/17 1233    Jene Every, MD 02/23/17 [phone]

## 2017-02-23 NOTE — ED Triage Notes (Signed)
Injured right ankle 2 weeks ago.  States pain started at right ankle to radiates up right calf.

## 2018-02-27 ENCOUNTER — Encounter: Payer: Self-pay | Admitting: Obstetrics & Gynecology

## 2018-02-27 ENCOUNTER — Other Ambulatory Visit (HOSPITAL_COMMUNITY)
Admission: RE | Admit: 2018-02-27 | Discharge: 2018-02-27 | Disposition: A | Discharge status: Home / Self Care | Payer: Medicare Other | Source: Ambulatory Visit | Attending: Obstetrics & Gynecology | Admitting: Obstetrics & Gynecology

## 2018-02-27 ENCOUNTER — Ambulatory Visit (INDEPENDENT_AMBULATORY_CARE_PROVIDER_SITE_OTHER): Payer: Medicare Other | Admitting: Obstetrics & Gynecology

## 2018-02-27 VITALS — BP 128/80 | Ht 63.0 in | Wt 312.0 lb

## 2018-02-27 DIAGNOSIS — N950 Postmenopausal bleeding: Secondary | ICD-10-CM | POA: Diagnosis not present

## 2018-02-27 DIAGNOSIS — Z124 Encounter for screening for malignant neoplasm of cervix: Secondary | ICD-10-CM

## 2018-02-27 NOTE — Progress Notes (Signed)
Postmenopausal Bleeding Patient is a 57 yo [id number] AA F complains of vaginal bleeding. She has been menopausal for 6 years. Had reg cycles prior to that.  NSVD x2.  BTL.  Currently on no HRT.  Bleeding is described as flow about like a period and has occurred for a week, then stopped, just this one times.  Some crampy abdominal pain at that time, none now.  No recent hormone or blood thinner use, no other recent changes in health, weight, trauma, infection, travel. Other menopausal symptoms include: none. Workup to date: none.  Menstrual History: OB History    Gravida  4   Para  2   Term   2   Preterm      AB  2   Living   2     SAB      TAB      Ectopic      Multiple      Live Births          MMG BIBC 2019 Colonoscopy several years ago    PMHx: She  has a past medical history of Depression and Hypertension. Also,  has a past surgical history that includes Medial partial knee replacement (Right)., family history is not on file.,  reports that she has never smoked. She has never used smokeless tobacco. She reports that she does not drink alcohol or use drugs.  She has a current medication list which includes the following prescription(s): trazodone, amlodipine, chlorthalidone, dicyclomine, fluoxetine, naproxen, ondansetron, polyethylene glycol, sumatriptan, topiramate, and tramadol. Also, is allergic to oxycontin [oxycodone hcl].  Review of Systems  Constitutional: Negative for chills, fever and malaise/fatigue.  HENT: Negative for congestion, sinus pain and sore throat.   Eyes: Negative for blurred vision and pain.  Respiratory: Negative for cough and wheezing.   Cardiovascular: Negative for chest pain and leg swelling.  Gastrointestinal: Negative for abdominal pain, constipation, diarrhea, heartburn, nausea and vomiting.  Genitourinary: Negative for dysuria, frequency, hematuria and urgency.  Musculoskeletal: Negative for back pain, joint pain, myalgias and neck  pain.  Skin: Negative for itching and rash.  Neurological: Negative for dizziness, tremors and weakness.  Endo/Heme/Allergies: Does not bruise/bleed easily.  Psychiatric/Behavioral: Negative for depression. The patient is not nervous/anxious and does not have insomnia.     Objective: BP 128/80   Ht 5\' 3"  (1.6 m)   Wt (!) 312 lb (141.5 kg)   LMP  (LMP Unknown) Comment: Patient states there is no way she is pregnant.   BMI 55.27 kg/m  Physical Exam  Constitutional: She is oriented to person, place, and time. She appears well-developed and well-nourished. No distress.  Genitourinary: Rectum normal, vagina normal and uterus normal. Pelvic exam was performed with patient supine. There is no rash or lesion on the right labia. There is no rash or lesion on the left labia. Vagina exhibits no lesion. No bleeding in the vagina. Right adnexum does not display mass and does not display tenderness. Left adnexum does not display mass and does not display tenderness. Cervix does not exhibit motion tenderness, lesion, friability or polyp.   Uterus is mobile and midaxial. Uterus is not enlarged or exhibiting a mass.  HENT:  Head: Normocephalic and atraumatic. Head is without laceration.  Right Ear: Hearing normal.  Left Ear: Hearing normal.  Nose: No epistaxis.  No foreign bodies.  Mouth/Throat: Uvula is midline, oropharynx is clear and moist and mucous membranes are normal.  Eyes: Pupils are equal, round, and reactive to light.  Neck: Normal range of motion. Neck supple. No thyromegaly present.  Cardiovascular: Normal rate and regular rhythm. Exam reveals no gallop and no friction rub.  No murmur heard. Pulmonary/Chest: Effort normal and breath sounds normal. No respiratory distress. She has no wheezes. Right breast exhibits no mass, no skin change and no tenderness. Left breast exhibits no mass, no skin change and no tenderness.  Abdominal: Soft. Bowel sounds are normal. She exhibits no distension.  There is no tenderness. There is no rebound.  Musculoskeletal: Normal range of motion.  Neurological: She is alert and oriented to person, place, and time. No cranial nerve deficit.  Skin: Skin is warm and dry.  Psychiatric: She has a normal mood and affect. Judgment normal.  Vitals reviewed.  ASSESSMENT/PLAN:   Visit Diagnoses    Postmenopausal bleeding    -  Primary   Screening for cervical cancer        1. PAP 2. EMB, see below 3. [place] 4. Etiology and risks for PMB discussed w pt. 5. MMG UTD at [facility] this year   Endometrial Biopsy After discussion with the patient regarding her abnormal uterine bleeding I recommended that she proceed with an endometrial biopsy for further diagnosis. The risks, benefits, alternatives, and indications for an endometrial biopsy were discussed with the patient in detail. She understood the risks including infection, bleeding, cervical laceration and uterine perforation.  Verbal consent was obtained.   PROCEDURE NOTE:  Pipelle endometrial biopsy was performed using aseptic technique with iodine preparation.  The uterus was sounded to a length of 7 cm.  Adequate sampling was obtained with minimal blood loss.  The patient tolerated the procedure well.  Disposition will be pending pathology.  Annamarie Major, MD, [facility], [facility] 02/27/2018  9:11 AM

## 2018-02-27 NOTE — Patient Instructions (Signed)
Endometrial Biopsy, Care After This sheet gives you information about how to care for yourself after your procedure. Your health care provider may also give you more specific instructions. If you have problems or questions, contact your health care provider. What can I expect after the procedure? After the procedure, it is common to have:  Mild cramping.  A small amount of vaginal bleeding for a few days. This is normal.  Follow these instructions at home:  Take over-the-counter and prescription medicines only as told by your health care provider.  Do not douche, use tampons, or have sexual intercourse until your health care provider approves.  Return to your normal activities as told by your health care provider. Ask your health care provider what activities are safe for you.  Follow instructions from your health care provider about any activity restrictions, such as restrictions on strenuous exercise or heavy lifting. Contact a health care provider if:  You have heavy bleeding, or bleed for longer than 2 days after the procedure.  You have bad smelling discharge from your vagina.  You have a fever or chills.  You have a burning sensation when urinating or you have difficulty urinating.  You have severe pain in your lower abdomen. Get help right away if:  You have severe cramps in your stomach or back.  You pass large blood clots.  Your bleeding increases.  You become weak or light-headed, or you pass out. Summary  After the procedure, it is common to have mild cramping and a small amount of vaginal bleeding for a few days.  Do not douche, use tampons, or have sexual intercourse until your health care provider approves.  Return to your normal activities as told by your health care provider. Ask your health care provider what activities are safe for you. This information is not intended to replace advice given to you by your health care provider. Make sure you discuss any  questions you have with your health care provider. Document Released: 02/26/2013 Document Revised: 05/24/2016 Document Reviewed: 05/24/2016 Elsevier Interactive Patient Education  2017 [employer].  Also: PAP every three years Mammogram every year    Call [phone] to schedule at [employer]

## 2018-02-28 LAB — CYTOLOGY - PAP
Adequacy: ABSENT
DIAGNOSIS: NEGATIVE

## 2018-03-08 ENCOUNTER — Ambulatory Visit (INDEPENDENT_AMBULATORY_CARE_PROVIDER_SITE_OTHER): Payer: Medicare Other | Admitting: Obstetrics & Gynecology

## 2018-03-08 ENCOUNTER — Ambulatory Visit (INDEPENDENT_AMBULATORY_CARE_PROVIDER_SITE_OTHER): Payer: Medicare Other

## 2018-03-08 ENCOUNTER — Encounter: Payer: Self-pay | Admitting: Obstetrics & Gynecology

## 2018-03-08 VITALS — BP 130/90 | Ht 63.5 in | Wt 303.0 lb

## 2018-03-08 DIAGNOSIS — N950 Postmenopausal bleeding: Secondary | ICD-10-CM

## 2018-03-08 NOTE — Progress Notes (Signed)
  HPI: Pt had one episode of PMB.  Menopausal 6 years.  No hormones. PAP normal EMB normal Ultrasound demonstrates no masses seen, see below  PMHx: She  has a past medical history of Depression and Hypertension. Also,  has a past surgical history that includes Medial partial knee replacement (Right)., family history is not on file.,  reports that she has never smoked. She has never used smokeless tobacco. She reports that she does not drink alcohol or use drugs.  She has a current medication list which includes the following prescription(s): amlodipine, chlorthalidone, dicyclomine, fluoxetine, naproxen, ondansetron, polyethylene glycol, sumatriptan, topiramate, tramadol, and trazodone. Also, is allergic to oxycontin [oxycodone hcl].  Review of Systems  All other systems reviewed and are negative.  Objective: BP 130/90   Ht 5' 3.5" (1.613 m)   Wt (!) 303 lb (137.4 kg)   LMP  (LMP Unknown) Comment: Patient states there is no way she is pregnant.   BMI 52.83 kg/m   Physical examination Constitutional NAD, Conversant  Skin No rashes, lesions or ulceration.   Extremities: Moves all appropriately.  Normal ROM for age. No lymphadenopathy.  Neuro: Grossly intact  Psych: Oriented to PPT.  Normal mood. Normal affect.   US Pelvis Transvanginal Non-ob (tv Only)  Result Date: 03/08/2018 Patient Name: Leslie Bautista DOB: 1960/10/04 MRN: [id number] ULTRASOUND REPORT Location: [facility] Date of Service: 03/08/2018 Indications:PMB Findings: The uterus is anteverted and measures 8.7x4.5x4.7cm. Echo texture is heterogenous with evidence of focal masses. A hyperechoic area = 1.8x2.2cm in anterior uterine wall (possibility of adenomyosis) The Endometrium measures 7.8 mm. Right Ovary measures not seen d/t filled bowel Left Ovary measures 2.6x2.1x1.2 cm. It is normal in appearance. Survey of the adnexa demonstrates no adnexal masses. There is  free fluid in the post. cul de sac. = 1.6x2.4cm  Impression: 1. A hyperechoic area = 1.8x2.2cm in anterior uterine wall (possibility of adenomyosis) Recommendations: 1.Clinical correlation with the patient's History and Physical Exam. Augustine Radar, RDMS Review of ULTRASOUND.    I have personally reviewed images and report of recent ultrasound done at [facility].    Plan of management to be discussed with patient. Annamarie Major, MD, FACOG [facility], [facility] 03/08/2018  11:47 AM   Assessment:  Postmenopausal bleeding  Monitor for recurrent episodes of bleeding.  If that occurs, then can consider D&C vs hysterectomy.  No fibroids, no bx evidence for cancer or hyperplasia, min evidence for adenomyosis.  A total of 15 minutes were spent face-to-face with the patient during this encounter and over half of that time dealt with counseling and coordination of care.  Annamarie Major, MD, [facility], [facility] 03/08/2018  11:55 AM

## 2018-03-08 NOTE — Addendum Note (Signed)
Addended by: Nadara Mustard on: 03/08/2018 09:15 AM   Modules accepted: Orders

## 2018-03-11 ENCOUNTER — Other Ambulatory Visit: Payer: Self-pay | Admitting: Internal Medicine

## 2018-03-11 ENCOUNTER — Other Ambulatory Visit: Payer: Self-pay

## 2018-03-11 ENCOUNTER — Other Ambulatory Visit: Payer: Self-pay | Admitting: Podiatry

## 2018-03-11 DIAGNOSIS — M7661 Achilles tendinitis, right leg: Secondary | ICD-10-CM

## 2018-03-27 ENCOUNTER — Ambulatory Visit
Admission: RE | Admit: 2018-03-27 | Discharge: 2018-03-27 | Disposition: A | Discharge status: Home / Self Care | Payer: Medicare Other | Source: Ambulatory Visit | Attending: Internal Medicine | Admitting: Internal Medicine

## 2018-04-12 ENCOUNTER — Ambulatory Visit
Admission: RE | Admit: 2018-04-12 | Discharge: 2018-04-12 | Disposition: A | Discharge status: Home / Self Care | Payer: Medicare Other | Source: Ambulatory Visit | Attending: Podiatry | Admitting: Podiatry

## 2018-04-12 DIAGNOSIS — M7661 Achilles tendinitis, right leg: Secondary | ICD-10-CM | POA: Diagnosis present

## 2018-05-29 ENCOUNTER — Encounter: Payer: Self-pay | Admitting: Emergency Medicine

## 2018-05-29 ENCOUNTER — Emergency Department: Payer: Medicare Other

## 2018-05-29 ENCOUNTER — Other Ambulatory Visit: Payer: Self-pay

## 2018-05-29 ENCOUNTER — Emergency Department
Admission: EM | Admit: 2018-05-29 | Discharge: 2018-05-29 | Disposition: A | Discharge status: Home / Self Care | Payer: Medicare Other | Attending: Emergency Medicine | Admitting: Emergency Medicine

## 2018-05-29 DIAGNOSIS — J040 Acute laryngitis: Secondary | ICD-10-CM | POA: Insufficient documentation

## 2018-05-29 DIAGNOSIS — I10 Essential (primary) hypertension: Secondary | ICD-10-CM | POA: Diagnosis not present

## 2018-05-29 DIAGNOSIS — J069 Acute upper respiratory infection, unspecified: Secondary | ICD-10-CM | POA: Diagnosis not present

## 2018-05-29 DIAGNOSIS — Z79899 Other long term (current) drug therapy: Secondary | ICD-10-CM | POA: Insufficient documentation

## 2018-05-29 DIAGNOSIS — R05 Cough: Secondary | ICD-10-CM | POA: Diagnosis present

## 2018-05-29 MED ORDER — HYDROCOD POLST-CPM POLST ER 10-8 MG/5ML PO SUER
5.0000 mL | Freq: Once | ORAL | Status: AC
  Administered 2018-05-29: 5 mL via ORAL
  Filled 2018-05-29: qty 5

## 2018-05-29 MED ORDER — PREDNISONE 20 MG PO TABS
60.0000 mg | ORAL_TABLET | Freq: Once | ORAL | Status: AC
  Administered 2018-05-29: 60 mg via ORAL
  Filled 2018-05-29: qty 3

## 2018-05-29 MED ORDER — GUAIFENESIN-CODEINE 100-10 MG/5ML PO SOLN: 10.0000 mL | Freq: Three times a day (TID) | ORAL | 0 refills | Status: DC | PRN

## 2018-05-29 MED ORDER — PREDNISONE 10 MG PO TABS: 50.0000 mg | ORAL_TABLET | Freq: Every day | ORAL | 0 refills | Status: DC

## 2018-05-29 NOTE — ED Notes (Signed)
Discharge paperwork reviewed with patient. Patient stated understanding and signed paperwork. Prescription medications dicussed and educated on no drive or operating equipment while taking cough medication. Patient stated understanding.

## 2018-05-29 NOTE — ED Triage Notes (Signed)
PT c/o cough, sore throat, headache, body aches xfew days. Denies fever. NAD noted

## 2018-05-29 NOTE — ED Provider Notes (Signed)
[facility] Emergency Department Provider Note  ____________________________________________  Time seen: Approximately 7:10 PM  I have reviewed the triage vital signs and the nursing notes.   HISTORY  Chief Complaint Cough   HPI Leslie Bautista is a 58 y.o. female who presents for nasal congestion, cough, chest wall pain with cough, and headache with cough. Her voice is hoarse. She has had body aches and chills.     Past Medical History:  Diagnosis Date  . Depression   . Hypertension     Patient Active Problem List   Diagnosis Date Noted  . Postmenopausal bleeding 03/08/2018    Past Surgical History:  Procedure Laterality Date  . MEDIAL PARTIAL KNEE REPLACEMENT Right     Prior to Admission medications   Medication Sig Start Date End Date Taking? Authorizing Provider  amLODipine (NORVASC) 10 MG tablet Take 10 mg by mouth daily. 08/25/15 08/24/16  [provider]  chlorthalidone (HYGROTON) 25 MG tablet Take 25 mg by mouth daily. 08/25/15 08/24/16  [provider]  dicyclomine (BENTYL) 20 MG tablet Take 1 tablet (20 mg total) by mouth every 6 (six) hours as needed. Patient not taking: Reported on 02/27/2018 10/20/15   Irean Hong, MD  FLUoxetine (PROZAC) 20 MG capsule Take 20 mg by mouth daily. 08/25/15 08/24/16  [provider]  guaiFENesin-codeine 100-10 MG/5ML syrup Take 10 mLs by mouth 3 (three) times daily as needed. 05/29/18   Yehuda Printup, Rulon Eisenmenger B, FNP  naproxen (NAPROSYN) 500 MG tablet Take 1 tablet (500 mg total) by mouth 2 (two) times daily with a meal. Patient not taking: Reported on 02/27/2018 02/23/17   Joni Reining, PA-C  ondansetron (ZOFRAN ODT) 4 MG disintegrating tablet Take 1 tablet (4 mg total) by mouth every 8 (eight) hours as needed for nausea or vomiting. Patient not taking: Reported on 02/27/2018 10/20/15   Irean Hong, MD  polyethylene glycol [facility] Ethelene Hal) packet Take 17 g by mouth daily. 08/25/15   [provider]  predniSONE (DELTASONE) 10 MG tablet Take 5 tablets (50 mg total) by mouth daily. 05/29/18   Brianne Maina, Rulon Eisenmenger B, FNP  SUMAtriptan (IMITREX) 50 MG tablet Take 50 mg by mouth as needed. 07/26/15 07/26/16  [provider]  topiramate (TOPAMAX) 25 MG tablet Take 25 mg by mouth daily. 08/25/15   [provider]  traMADol Janean Sark) 50 MG tablet Take by mouth. 12/28/17 12/28/18  [provider]  traZODone (DESYREL) 100 MG tablet Take 100 mg by mouth at bedtime. 08/25/15   [provider]    Allergies Oxycontin [oxycodone hcl]  No family history on file.  Social History Social History   Tobacco Use  . Smoking status: Never Smoker  . Smokeless tobacco: Never Used  Substance Use Topics  . Alcohol use: No  . Drug use: No    Review of Systems Constitutional: Positive for fever/chills. Normal appetite. ENT: Negative for sore throat. Cardiovascular: Denies chest pain. Respiratory: Negative for shortness of breath. Positive for cough. Negative for wheezing.  Gastrointestinal: Negative for nausea,  no vomiting.  Negative for diarrhea.  Musculoskeletal: Positive for body aches Skin: Negative for rash. Neurological: Positive for headaches ____________________________________________   PHYSICAL EXAM:  VITAL SIGNS: ED Triage Vitals  Enc Vitals Group     BP 05/29/18 1756 (!) 178/83     Pulse Rate 05/29/18 1756 61     Resp 05/29/18 1756 20     Temp 05/29/18 1756 98.4 F (36.9 C)  Temp Source 05/29/18 1756 Oral     SpO2 05/29/18 1756 94 %     Weight 05/29/18 1756 300 lb (136.1 kg)     Height 05/29/18 1756 5' 3.5" (1.613 m)     Head Circumference --      Peak Flow --      Pain Score 05/29/18 1805 8     Pain Loc --      Pain Edu? --      Excl. in GC? --     Constitutional: Alert and oriented. Acutely ill appearing and in no acute distress. Eyes: Conjunctivae are normal. Ears: Bilateral TM normal Nose: Maxillary sinus congestion noted; no  rhinnorhea. Mouth/Throat: Mucous membranes are moist.  Oropharynx without erythema. Tonsils without exudate. Uvula midline. Hoarse sounding voice. Neck: No stridor.  Lymphatic: No cervical lymphadenopathy. Cardiovascular: Normal rate, regular rhythm. Good peripheral circulation. Respiratory: Respirations are even and unlabored.  No retractions. Breath sounds are clear to auscultation.. Gastrointestinal: Soft and nontender.  Musculoskeletal: FROM x 4 extremities.  Neurologic:  Normal speech and language. Skin:  Skin is warm, dry and intact. No rash noted. Psychiatric: Mood and affect are normal. Speech and behavior are normal.  ____________________________________________   LABS (all labs ordered are listed, but only abnormal results are displayed)  Labs Reviewed - No data to display ____________________________________________  EKG  Not indicated. ____________________________________________  RADIOLOGY  Chest x-ray is negative for acute cardiopulmonary abnormality. ____________________________________________   PROCEDURES  Procedure(s) performed: None  Critical Care performed: No ____________________________________________   INITIAL IMPRESSION / ASSESSMENT AND PLAN / ED COURSE  58 y.o. female who presents to the ER for treatment and evaluation of viral symptoms. Chest xray is negative for acute findings. She will be treated with Tussionex and Prednisone tonight and then prescriptions for guaifenesin with codeine and prednisone will be submitted to her pharmacy.  She was instructed to rest, increase fluids, take Tylenol or ibuprofen for body aches.  She is encouraged to follow-up with her primary care provider if her symptoms are not improving over the next few days.  She was encouraged to return to the emergency department for symptoms change or worsen if unable to schedule appointment.    Medications  predniSONE (DELTASONE) tablet 60 mg (has no administration in time  range)  chlorpheniramine-HYDROcodone (TUSSIONEX) 10-8 MG/5ML suspension 5 mL (has no administration in time range)    ED Discharge Orders         Ordered    guaiFENesin-codeine 100-10 MG/5ML syrup  3 times daily PRN     05/29/18 2028    predniSONE (DELTASONE) 10 MG tablet  Daily     05/29/18 2028           Pertinent labs & imaging results that were available during my care of the patient were reviewed by me and considered in my medical decision making (see chart for details).    If controlled substance prescribed during this visit, 12 month history viewed on the [facility] prior to issuing an initial prescription for Schedule II or III opiod. ____________________________________________   FINAL CLINICAL IMPRESSION(S) / ED DIAGNOSES  Final diagnoses:  Laryngitis, acute  Acute upper respiratory infection    Note:  This document was prepared using Dragon voice recognition software and may include unintentional dictation errors.     Chinita Pester, FNP 05/29/18 2031    Jeanmarie Plant, MD 05/29/18 2150

## 2018-05-29 NOTE — Discharge Instructions (Signed)
Follow up with your primary care provider for symptoms that are not improving over the next few days.  Return to the ER for symptoms that change or worsen if unable to schedule an appointment. 

## 2018-05-29 NOTE — ED Notes (Addendum)
Cough, sore throat, headache and body aches X2-3 days. Voice hoarse

## 2018-07-24 ENCOUNTER — Ambulatory Visit (INDEPENDENT_AMBULATORY_CARE_PROVIDER_SITE_OTHER): Payer: Medicare Other | Admitting: Podiatry

## 2018-07-24 ENCOUNTER — Ambulatory Visit (INDEPENDENT_AMBULATORY_CARE_PROVIDER_SITE_OTHER): Payer: Medicaid Other

## 2018-07-24 ENCOUNTER — Encounter: Payer: Self-pay | Admitting: Podiatry

## 2018-07-24 ENCOUNTER — Ambulatory Visit (INDEPENDENT_AMBULATORY_CARE_PROVIDER_SITE_OTHER): Payer: Medicare Other

## 2018-07-24 VITALS — BP 134/65 | HR 58 | Resp 16

## 2018-07-24 DIAGNOSIS — M7751 Other enthesopathy of right foot: Secondary | ICD-10-CM | POA: Diagnosis not present

## 2018-07-24 DIAGNOSIS — M7752 Other enthesopathy of left foot: Secondary | ICD-10-CM | POA: Diagnosis not present

## 2018-07-24 DIAGNOSIS — M778 Other enthesopathies, not elsewhere classified: Secondary | ICD-10-CM

## 2018-07-24 DIAGNOSIS — M779 Enthesopathy, unspecified: Secondary | ICD-10-CM | POA: Diagnosis not present

## 2018-07-24 DIAGNOSIS — M7661 Achilles tendinitis, right leg: Secondary | ICD-10-CM | POA: Diagnosis not present

## 2018-07-24 DIAGNOSIS — M7750 Other enthesopathy of unspecified foot: Secondary | ICD-10-CM

## 2018-07-24 MED ORDER — MELOXICAM 15 MG PO TABS: 15.0000 mg | ORAL_TABLET | Freq: Every day | ORAL | 3 refills | Status: DC

## 2018-07-24 MED ORDER — METHYLPREDNISOLONE 4 MG PO TBPK: ORAL_TABLET | ORAL | 0 refills | Status: DC

## 2018-07-24 NOTE — Progress Notes (Signed)
Subjective:  Patient ID: Leslie Bautista, female    DOB: [birth year],  MRN: [id number] HPI Chief Complaint  Patient presents with  . Foot Pain    Posterior heel/ankle right - aching x 1.5 year, swelling, knkot, saw Dr. Hildred Laser couple braces-no help, had MRI done-said arthritis, no current treatment  . Foot Pain    Dorsal midfoot left - knot, swelling, intermittent pain x months, sharp pains shooting to big toe  . New Patient (Initial Visit)    58 y.o. female presents with the above complaint.   ROS: Denies fever chills nausea vomiting muscle aches pains calf pain back pain chest pain shortness of breath.  Past Medical History:  Diagnosis Date  . Depression   . Hypertension    Past Surgical History:  Procedure Laterality Date  . MEDIAL PARTIAL KNEE REPLACEMENT Right     Current Outpatient Medications:  .  amLODipine (NORVASC) 10 MG tablet, Take 10 mg by mouth daily., Disp: , Rfl:  .  chlorthalidone (HYGROTON) 25 MG tablet, Take 25 mg by mouth daily., Disp: , Rfl:  .  FLUoxetine (PROZAC) 20 MG capsule, Take 20 mg by mouth daily., Disp: , Rfl:  .  meloxicam (MOBIC) 15 MG tablet, Take 1 tablet (15 mg total) by mouth daily., Disp: 30 tablet, Rfl: 3 .  methylPREDNISolone (MEDROL DOSEPAK) 4 MG TBPK tablet, 6 day dose pack - take as directed, Disp: 21 tablet, Rfl: 0 .  naproxen (NAPROSYN) 500 MG tablet, Take 1 tablet (500 mg total) by mouth 2 (two) times daily with a meal., Disp: 20 tablet, Rfl: 00 .  traMADol (ULTRAM) 50 MG tablet, Take by mouth., Disp: , Rfl:  .  traZODone (DESYREL) 100 MG tablet, Take 100 mg by mouth at bedtime., Disp: , Rfl:   Allergies  Allergen Reactions  . Oxycontin [Oxycodone Hcl] Rash   Review of Systems Objective:   Vitals:   07/24/18 1018  BP: 134/65  Pulse: (!) 58  Resp: 16    General: Well developed, nourished, in no acute distress, alert and oriented x3   Dermatological: Skin is warm, dry and supple bilateral. Nails x 10 are well  maintained; remaining integument appears unremarkable at this time. There are no open sores, no preulcerative lesions, no rash or signs of infection present.  Painful ingrown toenail hallux left  Vascular: Dorsalis Pedis artery and Posterior Tibial artery pedal pulses are 2/4 bilateral with immedate capillary fill time. Pedal hair growth present. No varicosities and no lower extremity edema present bilateral.   Neruologic: Grossly intact via light touch bilateral. Vibratory intact via tuning fork bilateral. Protective threshold with Semmes Wienstein monofilament intact to all pedal sites bilateral. Patellar and Achilles deep tendon reflexes 2+ bilateral. No Babinski or clonus noted bilateral.   Musculoskeletal: No gross boney pedal deformities bilateral. No pain, crepitus, or limitation noted with foot and ankle range of motion bilateral. Muscular strength 5/5 in all groups tested bilateral.  Pain on palpation of the Achilles tendon of the right heel.  Gait: Unassisted, Nonantalgic.    Radiographs:  Radiographs taken today demonstrate a large retrocalcaneal heel spur and plantar calcaneal heel spur.  Thickening of the Achilles tendon.  I reviewed an MRI also which did state of severe thickening of the Achilles tendon with a tendinopathy.  Assessment & Plan:   Assessment: Achilles tendinitis chronic in nature times a year and a half right ingrown toenail hallux left.  Plan: Discussed etiology pathology conservative surgical therapies at this point start on Medrol Dosepak  to be followed by meloxicam.  Injected subcutaneously dexamethasone 2 mg and local anesthetic.  Tolerated procedure well without complications.  Also put her in a cam walker at nighttime.  We will follow-up with her in 3 to 4 weeks and consider extending physical therapy once again.  Consider another MRI.     Max T. [place], [place]

## 2018-07-24 NOTE — Patient Instructions (Signed)

## 2018-08-14 ENCOUNTER — Ambulatory Visit: Payer: Medicare Other | Admitting: Podiatry

## 2018-09-25 ENCOUNTER — Encounter: Payer: Self-pay | Admitting: Podiatry

## 2018-09-25 ENCOUNTER — Other Ambulatory Visit: Payer: Self-pay

## 2018-09-25 ENCOUNTER — Ambulatory Visit (INDEPENDENT_AMBULATORY_CARE_PROVIDER_SITE_OTHER): Payer: Medicare Other | Admitting: Podiatry

## 2018-09-25 VITALS — Temp 97.9°F

## 2018-09-25 DIAGNOSIS — L603 Nail dystrophy: Secondary | ICD-10-CM | POA: Diagnosis not present

## 2018-09-25 MED ORDER — NEOMYCIN-POLYMYXIN-HC 1 % OT SOLN: OTIC | 1 refills | Status: DC

## 2018-09-25 NOTE — Patient Instructions (Signed)

## 2018-09-25 NOTE — Progress Notes (Signed)
She presents today for follow-up of her Achilles tendinitis of her right heel.  She states that the right heel is still killing me has not really gotten any better the injection did not help at all.  And this toenail of the left great toe is bothersome I like to have it removed and tested.  I do not want it removed permanently at this point.  Objective: Vital signs stable alert oriented x3.  Pulses are palpable.  Still has tenderness on palpation of the Achilles as it inserts on the posterior aspect of the calcaneus though it does not appear to sit appear to be as symptomatic as it was last time.  It is not warm to the touch but it is mildly swollen.  I reviewed the MRI which was performed several months ago but did demonstrate a tendinopathy and interstitial tearing.  She also has a thick yellow discolored painful incurvated hallux nail left.  Assessment: Achilles tendinitis insertional in nature with interstitial tearing and tendinopathy right.  Painful hallux nail left.  Plan: Discussed etiology pathology and surgical therapies at this point requesting an over read on the MRI for surgical consideration.  Instructed her to get back into her cam walker.  I also performed a total nail avulsion to be sent for pathologic evaluation today and to alleviate her from symptoms.  She was given both oral and written home-going strips for the care and soaking of the toe and will follow-up with me in a few weeks for reevaluation of the MRI and to evaluate the toe.  Hopefully the pathology report will indicate a treatable organism.

## 2018-10-02 ENCOUNTER — Telehealth: Payer: Self-pay | Admitting: Podiatry

## 2018-10-02 NOTE — Telephone Encounter (Signed)
I spoke with patient and informed her to continue soaking for another 3-4 days and continue using Antibiotic drops, but after soaking she doesn't have to wrap toe unless wearing shoes.  She denied any redness, draining, s/s of infection.  She verbalized understanding

## 2018-10-02 NOTE — Telephone Encounter (Signed)
Pt wants to know how long she is supposed to soak her toenail from procedure on 06 May.

## 2018-10-16 ENCOUNTER — Other Ambulatory Visit: Payer: Self-pay

## 2018-10-16 ENCOUNTER — Encounter: Payer: Self-pay | Admitting: Podiatry

## 2018-10-16 ENCOUNTER — Ambulatory Visit (INDEPENDENT_AMBULATORY_CARE_PROVIDER_SITE_OTHER): Payer: Medicare Other | Admitting: Podiatry

## 2018-10-16 VITALS — Temp 97.1°F

## 2018-10-16 DIAGNOSIS — M722 Plantar fascial fibromatosis: Secondary | ICD-10-CM

## 2018-10-16 DIAGNOSIS — E119 Type 2 diabetes mellitus without complications: Secondary | ICD-10-CM

## 2018-10-16 DIAGNOSIS — M7661 Achilles tendinitis, right leg: Secondary | ICD-10-CM | POA: Diagnosis not present

## 2018-10-16 DIAGNOSIS — L603 Nail dystrophy: Secondary | ICD-10-CM | POA: Diagnosis not present

## 2018-10-16 DIAGNOSIS — Z01812 Encounter for preprocedural laboratory examination: Secondary | ICD-10-CM

## 2018-10-16 MED ORDER — FLUCONAZOLE 150 MG PO TABS: 300.0000 mg | ORAL_TABLET | ORAL | 0 refills | Status: DC

## 2018-10-16 NOTE — Progress Notes (Addendum)
She presents today to discuss the over read for her right ankle and leg.  She states that this thing is bothering me so much I am ready to have surgery.  She is also here for evaluation of her toenail pathology.  ROS: She denies fever chills nausea vomiting muscle aches pains calf pain back pain chest pain shortness of breath.  Objective: I have reviewed her past medical history medications allergies surgeries and social history.  Pulses are strongly palpable.  Neurologic sensorium is intact.  Deep tendon reflexes are intact muscle strength is normal symmetrical.  She has pain to palpation the posterior aspect of the Achilles superior to the insertion of the calcaneus but inclusive of the inferior posterior calcaneus as well as the plantar fascia.  Pathology report/MRI does demonstrate positive tear of 20% of the Achilles transversely as well as a 5 cm interstitial tear.  Pathology also demonstrates onychomycosis and a yeast infection.  Assessment: Onychomycosis yeast infection.  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  Plan: Discussed etiology pathology conservative versus surgical therapies.  At this point we will consent her for surgery she will bit receive general anesthesia a sciatic block and be placed prone on the operating room table for a gastroc recession Achilles tenolysis retrocalcaneal heel spur resection and endoscopic plantar fasciotomy manipulation of ankle joint under anesthesia and cast. Patient will need a knee scooter to be able to get around while she is non-weight bearing post operatively. She could likely need this scooter for approximately 12 months.  We discussed the possible postop complications which may include but not limited to postop pain bleeding swelling infection recurrence need for further surgery overcorrection under correction loss of digit loss of limb loss of life possible pulmonary embolism.  We will also go ahead and start her on  Diflucan 300 mg 1 tablet every week.  We requested blood work consisting of a complete metabolic panel and hemoglobin V7Q.  We are also requesting medical clearance.  If she is unable to be done at the surgery center then we will consider [place] with Dr. Logan Bores

## 2018-10-22 ENCOUNTER — Other Ambulatory Visit: Payer: Self-pay | Admitting: Podiatry

## 2018-10-23 LAB — HEMOGLOBIN A1C
Est. average glucose Bld gHb Est-mCnc: 123 mg/dL
Hgb A1c MFr Bld: 5.9 % — ABNORMAL HIGH (ref 4.8–5.6)

## 2018-10-23 LAB — COMPREHENSIVE METABOLIC PANEL
ALT: 10 IU/L (ref 0–32)
AST: 17 IU/L (ref 0–40)
Albumin/Globulin Ratio: 1.2 (ref 1.2–2.2)
Albumin: 4 g/dL (ref 3.8–4.9)
Alkaline Phosphatase: 70 IU/L (ref 39–117)
BUN/Creatinine Ratio: 16 (ref 9–23)
BUN: 22 mg/dL (ref 6–24)
Bilirubin Total: 0.3 mg/dL (ref 0.0–1.2)
CO2: 26 mmol/L (ref 20–29)
Calcium: 10.2 mg/dL (ref 8.7–10.2)
Chloride: 99 mmol/L (ref 96–106)
Creatinine, Ser: 1.4 mg/dL — ABNORMAL HIGH (ref 0.57–1.00)
GFR calc Af Amer: 48 mL/min/{1.73_m2} — ABNORMAL LOW (ref >59)
GFR calc non Af Amer: 42 mL/min/{1.73_m2} — ABNORMAL LOW (ref >59)
Globulin, Total: 3.3 g/dL (ref 1.5–4.5)
Glucose: 116 mg/dL — ABNORMAL HIGH (ref 65–99)
Potassium: 3.5 mmol/L (ref 3.5–5.2)
Sodium: 141 mmol/L (ref 134–144)
Total Protein: 7.3 g/dL (ref 6.0–8.5)

## 2018-10-30 ENCOUNTER — Telehealth: Payer: Self-pay

## 2018-10-30 NOTE — Telephone Encounter (Signed)
Patient has been notified of her lab results

## 2018-10-30 NOTE — Telephone Encounter (Signed)
-----   Message from Andres Ege, RN sent at 10/23/2018  3:49 PM EDT ----- Janace Hoard, please contact pt. Marcy Siren ----- Message ----- From: Garrel Ridgel, [place] Sent: 10/23/2018  12:36 PM EDT To: Andres Ege, RN  Blood work looks ok. Particularly A1c and liver profile.

## 2018-11-08 ENCOUNTER — Telehealth: Payer: Self-pay | Admitting: *Deleted

## 2018-11-08 NOTE — Telephone Encounter (Signed)
"  I'm a patient of Dr. Milinda Pointer.  I need to set up an appointment for surgery.  If you can, give me a call back please."

## 2018-11-11 NOTE — Telephone Encounter (Signed)
I attempted to return her call.  I left her a message to call me tomorrow. 

## 2018-11-14 NOTE — Telephone Encounter (Signed)
I'm returning your call.  You want to schedule your surgery?  "Yes, I do."  Dr. Stephenie Acres next available date is December 20, 2018.  "I'll take it."  I'll get it scheduled.  Someone from the surgical center will give you a call a day or two prior to your surgery date and will give you your arrival time.  You need to go online and register with the surgical center via there portal, instructions are in the brochure that we gave you.  "I'll take care of it."

## 2018-12-19 ENCOUNTER — Telehealth: Payer: Self-pay | Admitting: *Deleted

## 2018-12-19 ENCOUNTER — Other Ambulatory Visit: Payer: Self-pay | Admitting: Podiatry

## 2018-12-19 MED ORDER — HYDROMORPHONE HCL 4 MG PO TABS: 4.0000 mg | ORAL_TABLET | Freq: Four times a day (QID) | ORAL | 0 refills | Status: AC | PRN

## 2018-12-19 MED ORDER — CEPHALEXIN 500 MG PO CAPS: 500.0000 mg | ORAL_CAPSULE | Freq: Three times a day (TID) | ORAL | 0 refills | Status: DC

## 2018-12-19 MED ORDER — ONDANSETRON HCL 4 MG PO TABS: 4.0000 mg | ORAL_TABLET | Freq: Three times a day (TID) | ORAL | 0 refills | Status: DC | PRN

## 2018-12-19 NOTE — Telephone Encounter (Signed)
[facility] called states they need clarification of pt's medication orders.

## 2018-12-20 ENCOUNTER — Encounter: Payer: Self-pay | Admitting: Podiatry

## 2018-12-20 ENCOUNTER — Telehealth: Payer: Self-pay | Admitting: *Deleted

## 2018-12-20 ENCOUNTER — Telehealth: Payer: Self-pay | Admitting: Podiatry

## 2018-12-20 DIAGNOSIS — M722 Plantar fascial fibromatosis: Secondary | ICD-10-CM

## 2018-12-20 DIAGNOSIS — M216X1 Other acquired deformities of right foot: Secondary | ICD-10-CM | POA: Diagnosis not present

## 2018-12-20 DIAGNOSIS — M7661 Achilles tendinitis, right leg: Secondary | ICD-10-CM

## 2018-12-20 DIAGNOSIS — Z9889 Other specified postprocedural states: Secondary | ICD-10-CM

## 2018-12-20 NOTE — Telephone Encounter (Signed)
Pt's dtr, Margreta Journey states pt had surgery today with Dr. Milinda Pointer and would like a knee scooter. Faxed orders to [facility].

## 2018-12-20 NOTE — Telephone Encounter (Signed)
Pt is requesting a knee scooter. Please call patient or daughter that's on file.

## 2018-12-23 ENCOUNTER — Telehealth: Payer: Self-pay | Admitting: *Deleted

## 2018-12-23 NOTE — Telephone Encounter (Signed)
Addended.

## 2018-12-23 NOTE — Telephone Encounter (Signed)
Pt's dtr states she is calling for the status of the knee scooter, please contact pt at [phone].

## 2018-12-23 NOTE — Telephone Encounter (Signed)
Please make that addendum for me.  thx

## 2018-12-24 ENCOUNTER — Telehealth: Payer: Self-pay | Admitting: *Deleted

## 2018-12-24 NOTE — Telephone Encounter (Signed)
Pt request a call concerning her appt tomorrow.

## 2018-12-25 ENCOUNTER — Ambulatory Visit (INDEPENDENT_AMBULATORY_CARE_PROVIDER_SITE_OTHER): Payer: Medicare Other

## 2018-12-25 ENCOUNTER — Ambulatory Visit (INDEPENDENT_AMBULATORY_CARE_PROVIDER_SITE_OTHER): Payer: Self-pay | Admitting: Podiatry

## 2018-12-25 ENCOUNTER — Other Ambulatory Visit: Payer: Self-pay

## 2018-12-25 VITALS — Temp 97.3°F

## 2018-12-25 DIAGNOSIS — M7661 Achilles tendinitis, right leg: Secondary | ICD-10-CM

## 2018-12-25 DIAGNOSIS — Z9889 Other specified postprocedural states: Secondary | ICD-10-CM

## 2018-12-25 NOTE — Telephone Encounter (Signed)
I spoke with [facility]) and said they had received the order and it was in a retail store now.  She wasn't sure why patient had not been called yet, but store was currently closed.  I called and informed patient of this and I will follow up with [facility] to see when patient can pick up knee scooter.

## 2018-12-25 NOTE — Progress Notes (Signed)
She presents today for her first postop visit date of surgery 12/20/2018 with endoscopic plantar fasciotomy and Achilles tenolysis gastrocnemius recession and a retrocalcaneal heel spur resection with cast.  She denies fever chills nausea vomiting muscle aches pains calf pain back pain chest pain shortness of breath.  When asked if she has been walking on the bottom of the cast she states that some because she does not have a knee scooter.  Objective: Vital signs are stable she is alert and oriented x3.  Popliteal port pulses are palpable.  The cast is loose proximally snug distally she has great range of motion and good sensation to the toes.  Radiograph lateral today taken demonstrates well-healing ostectomy of the calcaneus.  Assessment: Well-healing surgical foot right.  Plan: Encouraged her to get a knee scooter utilize a knee scooter follow-up with me in 1 week for cast change.  Call with questions or concerns should she develop any calf pain or chest pain should immediately go to the emergency department.

## 2019-01-01 ENCOUNTER — Other Ambulatory Visit: Payer: Self-pay | Admitting: Podiatry

## 2019-01-01 ENCOUNTER — Ambulatory Visit (INDEPENDENT_AMBULATORY_CARE_PROVIDER_SITE_OTHER): Payer: Medicare Other | Admitting: Podiatry

## 2019-01-01 ENCOUNTER — Encounter: Payer: Self-pay | Admitting: Podiatry

## 2019-01-01 ENCOUNTER — Other Ambulatory Visit: Payer: Self-pay

## 2019-01-01 VITALS — Temp 97.9°F

## 2019-01-01 DIAGNOSIS — M7661 Achilles tendinitis, right leg: Secondary | ICD-10-CM

## 2019-01-01 DIAGNOSIS — Z9889 Other specified postprocedural states: Secondary | ICD-10-CM

## 2019-01-01 MED ORDER — HYDROMORPHONE HCL 4 MG PO TABS: 4.0000 mg | ORAL_TABLET | Freq: Four times a day (QID) | ORAL | 0 refills | Status: DC | PRN

## 2019-01-01 NOTE — Progress Notes (Signed)
She presents today for her second postop visit she is status post gastroc recession retrocalcaneal heel spur resection and Achilles tenolysis and an endoscopic plantar fasciotomy with cast.  She denies fever chills nausea vomiting muscle aches or pain states that it has been hard not to walk on it.  Objective: Vital signs are stable she is alert and oriented x3 cast was intact once removed demonstrates minimal edema no erythema cellulitis drainage or odor sutures are intact staples are intact margins are well coapted.  She has good range of motion with dorsiflexion plantarflexion plus 3 out of 5 plantar flexion against resistance with some tenderness posteriorly.  No loss of sensation anywhere on the foot.  No lesions or open wounds.  Assessment: Well-healing surgical foot.  Plan: Remove the sutures today for the EPF margins remain well coapted no sign or signs of infection.  Redressed the foot today with dressed a compressive dressing fluffy particularly the posterior aspect of the leg and heel.  Dressed it with 4 rolls of fiberglass cast and 5 rolls of Covan.  Follow-up with her 2 weeks for cast removal and application of a short leg walking boot.

## 2019-01-15 ENCOUNTER — Other Ambulatory Visit: Payer: Medicare Other

## 2019-01-15 ENCOUNTER — Ambulatory Visit (INDEPENDENT_AMBULATORY_CARE_PROVIDER_SITE_OTHER): Payer: Medicare Other | Admitting: Podiatry

## 2019-01-15 ENCOUNTER — Ambulatory Visit: Payer: Medicare Other

## 2019-01-15 ENCOUNTER — Other Ambulatory Visit: Payer: Self-pay

## 2019-01-15 DIAGNOSIS — Z9889 Other specified postprocedural states: Secondary | ICD-10-CM

## 2019-01-15 DIAGNOSIS — M7661 Achilles tendinitis, right leg: Secondary | ICD-10-CM

## 2019-01-15 NOTE — Progress Notes (Signed)
She presents today date of surgery 12/20/2018 status post endoscopic plantar fasciotomy Achilles tenolysis gastroc recession retrocalcaneal heel spur resection and cast application.  She denies fever chills nausea vomiting muscle aches pains calf pain back pain chest pain shortness of breath.  States that she is ready to get the staples out and get in the boot.  Objective: Presents today in a cast which is dirty on the bottom and somewhat broken down.  Once the cast was removed there is a strong smell of urine in the cast itself.  Staples are intact margins appear to be coapted though not 100%.  Every other staple was removed and the distal incision proximal incision all the staples were removed with no complications.  She has good dorsiflexion and plantar foot flexion against resistance.  Assessment: Well-healing surgical foot leg right.  Plan: Redressed her with a light dressing today in a regular sock placed her in a cam walker she is to start partial weightbearing but she is not to get this foot wet yet.  I will allow her to start putting lotion on the top of the foot and the bottom of the foot but not to get near the staples.  She understands that she has to dress this daily.  I will follow-up with her in about 1 week to remove the remainder staples.

## 2019-01-22 ENCOUNTER — Encounter: Payer: Self-pay | Admitting: Podiatry

## 2019-01-22 ENCOUNTER — Other Ambulatory Visit: Payer: Self-pay

## 2019-01-22 ENCOUNTER — Ambulatory Visit (INDEPENDENT_AMBULATORY_CARE_PROVIDER_SITE_OTHER): Payer: Self-pay | Admitting: Podiatry

## 2019-01-22 DIAGNOSIS — Z9889 Other specified postprocedural states: Secondary | ICD-10-CM

## 2019-01-22 DIAGNOSIS — M7661 Achilles tendinitis, right leg: Secondary | ICD-10-CM

## 2019-01-22 NOTE — Progress Notes (Signed)
She presents today for follow-up of her EPF Achilles tenolysis gastroc recession retrocalcaneal spur resection manipulation of the ankle.  Date of surgery 12/20/2018.  She states that she is feels like that she is may have been up on the foot quite a bit she says is starting to hurt in the back.  Objective: Vital signs are stable she is alert and oriented x3 staples remaining in the back were removed today there is a small dehiscence less than a centimeter in length and 5 mm in diameter with some fibrin deposition present.  There is some drainage: Dressing.  But currently no signs of infection no purulence no malodor.  She has great dorsiflexion and good plantar flexion against resistance.  Assessment: Well-healing surgical foot mild dehiscence distally.  Plan: We will watch this wound carefully the remainder of the staples were removed today.  Debrided the area washed it thoroughly placed her dressing with Iodosorb demonstrated how to do this and she will do this daily.  I also recommended that she continue utilizing the knee scooter rather than standing on this.  I will follow-up with her in 1 to 2 weeks.  If this becomes more painful red swollen purulent she is to notify us immediately.

## 2019-01-28 NOTE — Progress Notes (Signed)
DOS  12/20/2018  Achilles tenolysis, calcaneal spur resection, gastroc recession, endoscopic plantar fasciotomy, and manipulation of ankle under anesthesia with  cast application right foot.

## 2019-01-29 ENCOUNTER — Ambulatory Visit (INDEPENDENT_AMBULATORY_CARE_PROVIDER_SITE_OTHER): Payer: Self-pay | Admitting: Podiatry

## 2019-01-29 ENCOUNTER — Other Ambulatory Visit: Payer: Self-pay | Admitting: Podiatry

## 2019-01-29 ENCOUNTER — Other Ambulatory Visit: Payer: Medicare Other

## 2019-01-29 ENCOUNTER — Other Ambulatory Visit: Payer: Self-pay

## 2019-01-29 DIAGNOSIS — M7661 Achilles tendinitis, right leg: Secondary | ICD-10-CM

## 2019-01-29 DIAGNOSIS — T8130XA Disruption of wound, unspecified, initial encounter: Secondary | ICD-10-CM

## 2019-01-29 MED ORDER — HYDROMORPHONE HCL 4 MG PO TABS: 4.0000 mg | ORAL_TABLET | Freq: Four times a day (QID) | ORAL | 0 refills | Status: DC | PRN

## 2019-01-29 MED ORDER — CLINDAMYCIN HCL 150 MG PO CAPS: 150.0000 mg | ORAL_CAPSULE | Freq: Three times a day (TID) | ORAL | 0 refills | Status: DC

## 2019-01-29 NOTE — Progress Notes (Signed)
She presents today date of surgery 12/20/2018 status post gastroc recession retrocalcaneal heel spur resection Achilles tenolysis and cast.  States that my foot feels better but the incision still hurts and it started to open and it smells.  She denies fever chills nausea vomiting muscle aches and pains.  States that she has been using her scooter all the time.  Then when I did ask her later she states that she is been on the foot considerably.  Objective: Vital signs are stable she is alert and oriented x3.  Pulses are palpable.  Distalmost aspect of the incision does demonstrate a dehiscence granulation tissue is present epithelialization is occurring I do not see any deep structures of the wound.  I debrided all reactive hyperkeratotic tissue surrounding the area.  Took a sample of the tissue today to be sent for culture and sensitivity.  Assessment: Well-healing surgical foot mild dehiscence distally cannot rule out infection at this point.  Plan: At this point I debrided all of the nonviable tissue and placed Iodosorb in the wound.  With a dry compression dressing.  Wrote a prescription for clindamycin.  I would like Dr. Amalia Hailey to evaluate this patient and take care of this wound until it heals.  I will follow-up with the patient occasionally for evaluation of postop progress.  However I think that he is more competent wound care than I am possibly utilizing findings from the culture sensitivity possible wound VAC or grafting.

## 2019-01-31 ENCOUNTER — Ambulatory Visit (INDEPENDENT_AMBULATORY_CARE_PROVIDER_SITE_OTHER): Payer: Medicare Other | Admitting: Podiatry

## 2019-01-31 ENCOUNTER — Encounter: Payer: Self-pay | Admitting: Podiatry

## 2019-01-31 ENCOUNTER — Other Ambulatory Visit: Payer: Self-pay

## 2019-01-31 DIAGNOSIS — L97412 Non-pressure chronic ulcer of right heel and midfoot with fat layer exposed: Secondary | ICD-10-CM | POA: Diagnosis not present

## 2019-01-31 DIAGNOSIS — Z9889 Other specified postprocedural states: Secondary | ICD-10-CM

## 2019-01-31 LAB — WOUND CULTURE

## 2019-01-31 NOTE — Progress Notes (Signed)
   HPI: 58 y.o. female presenting today for evaluation regarding an ulceration that developed secondary to dehiscence to the right posterior heel.  Patient had retrocalcaneal exostectomy with repair of Achilles tendon performed on 12/20/2018 by Dr. Milinda Pointer.  Patient subsequently developed an ulceration to the posterior heel.  Patient was referred to me for wound care and management of the ulceration.  Past Medical History:  Diagnosis Date  . Depression   . Hypertension      Physical Exam: General: The patient is alert and oriented x3 in no acute distress.  Dermatology: Ulceration noted to the posterior heel measuring approximately 2.5 x 2.0 x 0.3 cm.  To the noted ulceration there is extensive amount of fibrotic debris noted within the ulceration site.  No exposed bone muscle tendon ligament or joint.  Mild malodor noted.  Moderate serous drainage noted.  Periwound integrity is intact.  Vascular: Palpable pedal pulses bilaterally. No edema or erythema noted. Capillary refill within normal limits.  Neurological: Epicritic and protective threshold grossly intact bilaterally.   Musculoskeletal Exam: Status post right lower extremity surgery performed on 12/20/2018.  Assessment: 1.  Status post retrocalcaneal exostectomy with repair of Achilles tendon and gastroc recession right 2.  Ulceration right posterior heel secondary to iatrogenic dehiscence   Plan of Care:  1. Patient evaluated.  Cultures that were taken previously were reviewed today 2.  Medically necessary excisional debridement including subcutaneous tissue was performed using a tissue nipper.  Excisional debridement of all necrotic nonviable tissue down to healthy bleeding viable tissue was performed with post remeasurement same as pre-. 3.Aquacel Ag and dry sterile dressing applied.  Order placed today for home health dressing supplies consisting of Aquacel Ag 4.  Continue weightbearing in the immobilization cam boot 5.  Continue  clindamycin 300 mg 3 times daily as prescribed 6.  Return to clinic in 2 weeks      Edrick Kins, DPM [facility]  Dr. Edrick Kins, DPM    [address], [place] [address]                Office [phone]  Fax [phone]

## 2019-02-03 NOTE — Progress Notes (Signed)
Anytime! Thanks for forwarding.

## 2019-02-04 ENCOUNTER — Telehealth: Payer: Self-pay | Admitting: Podiatry

## 2019-02-04 NOTE — Progress Notes (Signed)
Thanks. And yes! ..Marland Kitchen I miss doing ankle fractures. Thanks again!

## 2019-02-04 NOTE — Telephone Encounter (Signed)
I returned call to patient and informed her that [facility] will be sending Aquacel pads in the next day or two.  I instructed her to call me back if she does not receive them.  She verbalized understanding

## 2019-02-04 NOTE — Telephone Encounter (Signed)
Saw Dr. Amalia Hailey on Friday 09/11 and stated she would get some medical pads in the mail. States she is on the last one and has not received any in the mail yet.

## 2019-02-05 ENCOUNTER — Other Ambulatory Visit: Payer: Medicare Other

## 2019-02-05 ENCOUNTER — Encounter: Payer: Medicare Other | Admitting: Podiatry

## 2019-02-13 ENCOUNTER — Telehealth: Payer: Self-pay

## 2019-02-13 NOTE — Telephone Encounter (Signed)
-----   Message from Arlyce Dice sent at 02/12/2019  9:55 AM EDT ----- Patient called in requesting a Refill of antibiotic medication that she is currently using. She has ran out.   Thanks!

## 2019-02-13 NOTE — Telephone Encounter (Signed)
Patient notified via voice mail that we will wait until her appt tomorrow to eval wound and decide if Dr. Amalia Hailey wants to refill antibiotic.

## 2019-02-14 ENCOUNTER — Encounter: Payer: Self-pay | Admitting: Podiatry

## 2019-02-14 ENCOUNTER — Other Ambulatory Visit: Payer: Self-pay

## 2019-02-14 ENCOUNTER — Ambulatory Visit (INDEPENDENT_AMBULATORY_CARE_PROVIDER_SITE_OTHER): Payer: Medicare Other | Admitting: Podiatry

## 2019-02-14 DIAGNOSIS — L97412 Non-pressure chronic ulcer of right heel and midfoot with fat layer exposed: Secondary | ICD-10-CM

## 2019-02-14 DIAGNOSIS — Z9889 Other specified postprocedural states: Secondary | ICD-10-CM | POA: Diagnosis not present

## 2019-02-14 MED ORDER — HYDROMORPHONE HCL 4 MG PO TABS: 4.0000 mg | ORAL_TABLET | ORAL | 0 refills | Status: DC | PRN

## 2019-02-14 MED ORDER — CLINDAMYCIN HCL 150 MG PO CAPS: 150.0000 mg | ORAL_CAPSULE | Freq: Three times a day (TID) | ORAL | 0 refills | Status: DC

## 2019-02-16 NOTE — Progress Notes (Signed)
   HPI: 58 y.o. female presenting today status post rertocalcaneal exostectomy with repair of Achilles tendon and gastroc recession right. DOS: 12/20/2018. She is here for follow up evaluation of an ulceration noted to the right heel. She states the wound is about the same. She reports some associated intermittent pain. She has been using the Aquacel dressing as directed. She has taken the Clindamycin and Percocet for treatment with some relief. There are no worsening factors noted. Patient is here for further evaluation and treatment.   Past Medical History:  Diagnosis Date  . Depression   . Hypertension      Physical Exam: General: The patient is alert and oriented x3 in no acute distress.  Dermatology: Ulceration noted to the posterior heel measuring approximately 2.0 x 1.5 x 0.2 cm.  To the noted ulceration there is extensive amount of fibrotic debris noted within the ulceration site.  No exposed bone muscle tendon ligament or joint.  Mild malodor noted.  Moderate serous drainage noted.  Periwound integrity is intact.  Vascular: Palpable pedal pulses bilaterally. No edema or erythema noted. Capillary refill within normal limits.  Neurological: Epicritic and protective threshold grossly intact bilaterally.   Musculoskeletal Exam: Status post right lower extremity surgery performed on 12/20/2018.  Assessment: 1. Status post retrocalcaneal exostectomy with repair of Achilles tendon and gastroc recession right 2. Ulceration right posterior heel secondary to iatrogenic dehiscence   Plan of Care:  1. Patient evaluated.   2. Medically necessary excisional debridement including subcutaneous tissue was performed using a tissue nipper and a chisel blade. Excisional debridement of all the necrotic nonviable tissue down to healthy bleeding viable tissue was performed with post-debridement measurements same as pre-. 3. The wound was cleansed and dry sterile dressing applied. 4. Continue using  Aquacel daily with a dry sterile dressing.  5. Refill prescription for Percocet 5/325 mg provided to patient.  6. Refill prescription for Clindamycin 300 mg TID provided to patient.  7. Continue weightbearing in CAM boot.  8. Return to clinic in 3 weeks.       Edrick Kins, DPM [facility]  Dr. Edrick Kins, DPM    [address], [place] [address]                Office [phone]  Fax [phone]

## 2019-02-28 ENCOUNTER — Telehealth: Payer: Self-pay

## 2019-02-28 NOTE — Telephone Encounter (Signed)
Please send in rx pain medication to pharmacy if you approve   Thanks!

## 2019-02-28 NOTE — Telephone Encounter (Signed)
-----   Message from Leslie Bautista sent at 02/26/2019  5:07 PM EDT ----- Patient called stating she has lost her prescription for her antibiotic and pain medication and needs to have a refill. Check with pharmacy 1st to ensure that the prescription has not been picked up since her visit on 02/14/19 -

## 2019-03-04 ENCOUNTER — Ambulatory Visit (INDEPENDENT_AMBULATORY_CARE_PROVIDER_SITE_OTHER): Payer: Medicare Other | Admitting: Podiatry

## 2019-03-04 ENCOUNTER — Other Ambulatory Visit: Payer: Self-pay

## 2019-03-04 ENCOUNTER — Encounter: Payer: Self-pay | Admitting: Podiatry

## 2019-03-04 DIAGNOSIS — Z9889 Other specified postprocedural states: Secondary | ICD-10-CM | POA: Diagnosis not present

## 2019-03-04 DIAGNOSIS — L97412 Non-pressure chronic ulcer of right heel and midfoot with fat layer exposed: Secondary | ICD-10-CM

## 2019-03-09 NOTE — Progress Notes (Signed)
   HPI: 58 y.o. female presenting today status post rertocalcaneal exostectomy with repair of Achilles tendon and gastroc recession right. DOS: 12/20/2018. She is here for follow up evaluation of an ulceration noted to the right heel. She states she is doing well and the wound looks about the same. She has been using the Aquacel Ag daily. There are no modifying factors noted. Patient is here for further evaluation and treatment.    Past Medical History:  Diagnosis Date  . Depression   . Hypertension      Physical Exam: General: The patient is alert and oriented x3 in no acute distress.  Dermatology: Ulceration noted to the posterior heel measuring approximately 2.0 x 1.0 x 0.2 cm.  To the noted ulceration there is extensive amount of fibrotic debris noted within the ulceration site.  No exposed bone muscle tendon ligament or joint.  Mild malodor noted.  Moderate serous drainage noted.  Periwound integrity is intact.  Vascular: Palpable pedal pulses bilaterally. No edema or erythema noted. Capillary refill within normal limits.  Neurological: Epicritic and protective threshold grossly intact bilaterally.   Musculoskeletal Exam: Status post right lower extremity surgery performed on 12/20/2018.  Assessment: 1. Status post retrocalcaneal exostectomy with repair of Achilles tendon and gastroc recession right 2. Ulceration right posterior heel secondary to iatrogenic dehiscence   Plan of Care:  1. Patient evaluated.   2. Medically necessary excisional debridement including subcutaneous tissue was performed using a tissue nipper and a chisel blade. Excisional debridement of all the necrotic nonviable tissue down to healthy bleeding viable tissue was performed with post-debridement measurements same as pre-. 3. The wound was cleansed and dry sterile dressing applied. 4. Continue using Aquacel daily with a dry sterile dressing.  5. Continue using CAM boot. Weightbearing as tolerated.  6.  Return to clinic in 3 weeks.        Edrick Kins, DPM [facility]  Dr. Edrick Kins, DPM    [address], [place] [address]                Office [phone]  Fax [phone]

## 2019-03-25 ENCOUNTER — Other Ambulatory Visit: Payer: Self-pay

## 2019-03-25 ENCOUNTER — Ambulatory Visit (INDEPENDENT_AMBULATORY_CARE_PROVIDER_SITE_OTHER): Payer: Medicare Other | Admitting: Podiatry

## 2019-03-25 ENCOUNTER — Encounter: Payer: Self-pay | Admitting: Podiatry

## 2019-03-25 DIAGNOSIS — L97412 Non-pressure chronic ulcer of right heel and midfoot with fat layer exposed: Secondary | ICD-10-CM

## 2019-03-25 DIAGNOSIS — Z9889 Other specified postprocedural states: Secondary | ICD-10-CM | POA: Diagnosis not present

## 2019-03-28 NOTE — Progress Notes (Signed)
   HPI: 58 y.o. female presenting today status post rertocalcaneal exostectomy with repair of Achilles tendon and gastroc recession right. DOS: 12/20/2018. She is here for follow up evaluation of a right posterior heel ulceration. She states the wound is almost healed completely. She reports some mild tenderness in the posterior heel. Touching the area increases the discomfort. She has been using the Aquacel and CAM boot as directed. Patient is here for further evaluation and treatment.    Past Medical History:  Diagnosis Date  . Depression   . Hypertension      Physical Exam: General: The patient is alert and oriented x3 in no acute distress.  Dermatology: Wound noted to the right posterior heel has healed. Complete re-epithelialization has occurred. No drainage noted.   Vascular: Palpable pedal pulses bilaterally. No edema or erythema noted. Capillary refill within normal limits.  Neurological: Epicritic and protective threshold grossly intact bilaterally.   Musculoskeletal Exam: Status post right lower extremity surgery performed on 12/20/2018.  Assessment: 1. Status post retrocalcaneal exostectomy with repair of Achilles tendon and gastroc recession right 2. Ulceration right posterior heel secondary to iatrogenic dehiscence - healed    Plan of Care:  1. Patient evaluated.   2. Light debridement of right posterior heel performed with a tissue nipper.  3. Recommended good shoe gear.  4. May resume full activity with no restrictions.  5. Return to clinic as needed.       Edrick Kins, DPM [facility]  Dr. Edrick Kins, DPM    [address], [place] [address]                Office [phone]  Fax [phone]

## 2019-04-30 ENCOUNTER — Encounter: Payer: Self-pay | Admitting: Podiatry

## 2019-04-30 ENCOUNTER — Ambulatory Visit (INDEPENDENT_AMBULATORY_CARE_PROVIDER_SITE_OTHER): Payer: Medicare Other | Admitting: Podiatry

## 2019-04-30 ENCOUNTER — Other Ambulatory Visit: Payer: Self-pay

## 2019-04-30 DIAGNOSIS — M7661 Achilles tendinitis, right leg: Secondary | ICD-10-CM | POA: Diagnosis not present

## 2019-04-30 DIAGNOSIS — Z9889 Other specified postprocedural states: Secondary | ICD-10-CM

## 2019-04-30 MED ORDER — HYDROCODONE-ACETAMINOPHEN 10-325 MG PO TABS: 1.0000 | ORAL_TABLET | Freq: Three times a day (TID) | ORAL | 0 refills | Status: DC | PRN

## 2019-04-30 NOTE — Progress Notes (Signed)
She presents today date of surgery December 20, 2018 status post Achilles tenolysis and epilation of the ankle endoscopic fasciotomy gastroc recession calcaneal ostectomy.  She states that is really doing good she states I still get a lot of pulling along my calf and into my lateral knee as she points to the lateral knee.  Objective: Vital signs are stable she is alert and oriented x3 there is minimal edema no erythema cellulitis drainage or odor no pain on palpation of the Achilles other than where she had a wound that eventually healed with scar tissue.  There is still considerable adhesions and is mildly tender.  She has good plantar flexion against resistance without pain.  Assessment: Scar tissue of the Achilles at its distalmost aspect of the insertion site.  Otherwise appears to be well-healing.  Plan: I wrote another prescription for pain medicine and sent her to physical therapy and will follow up with her in 2 months

## 2019-07-02 ENCOUNTER — Other Ambulatory Visit: Payer: Self-pay

## 2019-07-02 ENCOUNTER — Ambulatory Visit (INDEPENDENT_AMBULATORY_CARE_PROVIDER_SITE_OTHER): Payer: Medicare HMO | Admitting: Podiatry

## 2019-07-02 DIAGNOSIS — M7661 Achilles tendinitis, right leg: Secondary | ICD-10-CM

## 2019-07-02 DIAGNOSIS — Z9889 Other specified postprocedural states: Secondary | ICD-10-CM

## 2019-07-02 MED ORDER — OXYCODONE-ACETAMINOPHEN 10-325 MG PO TABS: 1.0000 | ORAL_TABLET | ORAL | 0 refills | Status: AC | PRN

## 2019-07-02 NOTE — Progress Notes (Signed)
She presents today for follow-up of her Achilles surgery.  States that everything is doing much better she states that she is able to do everything she wants to start getting up and getting around states that is about 50% better than it was prior to surgery.  She continues to see Alycia Rossetti at [facility].  Only hurts when I do this as she inverts the foot and points to pain on the medial aspect of the foot.  This would be around the posterior tibial tendon area.  Objective: Vital signs are stable she is alert oriented x3.  Pulses are palpable.  Neurologic sensorium is intact.  She has no tenderness on palpation of the posterior tibial tendon with exception of inversion against resistance and it is tender.  Much decrease in edema along the Achilles margins feel great everything appears to be normal.  She still has some thickening and scar tissue in the distal aspect of the gastroc recession incision.  Assessment: Well-healing surgical foot.  Plan: At this point we will continue physical therapy and encouraged that she try to do a little more regularly and I did go ahead and give her another dose of Percocet just to help get over the days of intense physical therapy.

## 2019-08-28 ENCOUNTER — Ambulatory Visit: Payer: Medicare Other | Attending: Internal Medicine

## 2019-08-28 ENCOUNTER — Ambulatory Visit: Payer: Medicare Other

## 2019-08-28 DIAGNOSIS — Z23 Encounter for immunization: Secondary | ICD-10-CM

## 2019-08-28 NOTE — Progress Notes (Signed)
   Covid-19 Vaccination Clinic  Name:  Leslie Bautista    MRN: [id number] DOB: 1960-09-05  08/28/2019  [name] was observed post Covid-19 immunization for 15 minutes without incident. She was provided with Vaccine Information Sheet and instruction to access the V-Safe system.   [name] was instructed to call 911 with any severe reactions post vaccine: Marland Kitchen Difficulty breathing  . Swelling of face and throat  . A fast heartbeat  . A bad rash all over body  . Dizziness and weakness   Immunizations Administered    Name Date Dose VIS Date Route   Pfizer COVID-19 Vaccine 08/28/2019  9:59 AM 0.3 mL 05/02/2019 Intramuscular   Manufacturer: [employer], [employer]   Lot: [id number]   NDC: [address]-9

## 2019-09-01 ENCOUNTER — Ambulatory Visit (INDEPENDENT_AMBULATORY_CARE_PROVIDER_SITE_OTHER): Payer: Medicare Other | Admitting: Podiatry

## 2019-09-01 ENCOUNTER — Other Ambulatory Visit: Payer: Self-pay

## 2019-09-01 ENCOUNTER — Encounter: Payer: Self-pay | Admitting: Podiatry

## 2019-09-01 DIAGNOSIS — M7661 Achilles tendinitis, right leg: Secondary | ICD-10-CM | POA: Diagnosis not present

## 2019-09-01 NOTE — Progress Notes (Signed)
She presents today for follow-up of her Achilles tendinitis states it still hurts and swells is a knot on the back of the heel.  For the most part she states that is still about 50 may be 60% improved since before surgery.  Objective: Vital signs are stable alert and oriented x3.  Pulses are palpable.  No erythema edema cellulitis drainage or odor hallux nail left appears to be completely grown out.  Assessment: Well-healing surgical foot leg right well-healing onychomycotic nail hallux left.  Plan: At this point I will follow-up with her in 3 months for her right foot.  I am also going to recommend that she continue current therapies and continue to exercise to strengthen that foot.  She will notify us with any regression or an increase in pain.

## 2019-09-23 ENCOUNTER — Ambulatory Visit: Payer: Medicare Other | Attending: Internal Medicine

## 2019-09-23 DIAGNOSIS — Z23 Encounter for immunization: Secondary | ICD-10-CM

## 2019-09-23 NOTE — Progress Notes (Signed)
   Covid-19 Vaccination Clinic  Name:  Rosie Torrez    MRN: [id number] DOB: [birth year]  09/23/2019  Ms. Urich was observed post Covid-19 immunization for 15 minutes without incident. She was provided with Vaccine Information Sheet and instruction to access the V-Safe system.   Ms. Cerro was instructed to call 911 with any severe reactions post vaccine: Marland Kitchen Difficulty breathing  . Swelling of face and throat  . A fast heartbeat  . A bad rash all over body  . Dizziness and weakness   Immunizations Administered    Name Date Dose VIS Date Route   Pfizer COVID-19 Vaccine 09/23/2019  8:24 AM 0.3 mL 07/16/2018 Intramuscular   Manufacturer: [employer], [employer]   Lot: [id number]   NDC: [address]-0

## 2019-10-24 ENCOUNTER — Ambulatory Visit (INDEPENDENT_AMBULATORY_CARE_PROVIDER_SITE_OTHER): Payer: Medicare Other | Admitting: Obstetrics & Gynecology

## 2019-10-24 ENCOUNTER — Other Ambulatory Visit (HOSPITAL_COMMUNITY)
Admission: RE | Admit: 2019-10-24 | Discharge: 2019-10-24 | Disposition: A | Discharge status: Home / Self Care | Payer: Medicare Other | Source: Ambulatory Visit | Attending: Obstetrics & Gynecology | Admitting: Obstetrics & Gynecology

## 2019-10-24 ENCOUNTER — Encounter: Payer: Self-pay | Admitting: Obstetrics & Gynecology

## 2019-10-24 ENCOUNTER — Other Ambulatory Visit: Payer: Self-pay

## 2019-10-24 VITALS — BP 130/80 | Ht 64.0 in | Wt 296.0 lb

## 2019-10-24 DIAGNOSIS — N9419 Other specified dyspareunia: Secondary | ICD-10-CM | POA: Diagnosis not present

## 2019-10-24 DIAGNOSIS — N950 Postmenopausal bleeding: Secondary | ICD-10-CM

## 2019-10-24 MED ORDER — REPLENS VA GEL: 1.0000 | VAGINAL | 11 refills | Status: DC

## 2019-10-24 NOTE — Patient Instructions (Signed)
Menopause Menopause is the normal time of life when menstrual periods stop completely. It is usually confirmed by 12 months without a menstrual period. The transition to menopause (perimenopause) most often happens between the ages of 18 and 49. During perimenopause, hormone levels change in your body, which can cause symptoms and affect your health. Menopause may increase your risk for:  Loss of bone (osteoporosis), which causes bone breaks (fractures).  Depression.  Hardening and narrowing of the arteries (atherosclerosis), which can cause heart attacks and strokes. What are the causes? This condition is usually caused by a natural change in hormone levels that happens as you get older. The condition may also be caused by surgery to remove both ovaries (bilateral oophorectomy). What increases the risk? This condition is more likely to start at an earlier age if you have certain medical conditions or treatments, including:  A tumor of the pituitary gland in the brain.  A disease that affects the ovaries and hormone production.  Radiation treatment for cancer.  Certain cancer treatments, such as chemotherapy or hormone (anti-estrogen) therapy.  Heavy smoking and excessive alcohol use.  Family history of early menopause. This condition is also more likely to develop earlier in women who are very thin. What are the signs or symptoms? Symptoms of this condition include:  Hot flashes.  Irregular menstrual periods.  Night sweats.  Changes in feelings about sex. This could be a decrease in sex drive or an increased comfort around your sexuality.  Vaginal dryness and thinning of the vaginal walls. This may cause painful intercourse.   TREAT w REPLENS twice weekly  Dryness of the skin and development of wrinkles.  Headaches.  Problems sleeping (insomnia).  Mood swings or irritability.  Memory problems.  Weight gain.  Hair growth on the face and chest.  Bladder infections  or problems with urinating. How is this diagnosed? This condition is diagnosed based on your medical history, a physical exam, your age, your menstrual history, and your symptoms. Hormone tests may also be done. How is this treated? In some cases, no treatment is needed. You and your health care provider should make a decision together about whether treatment is necessary. Treatment will be based on your individual condition and preferences. Treatment for this condition focuses on managing symptoms. Treatment may include:  Menopausal hormone therapy (MHT).  Medicines to treat specific symptoms or complications.  Acupuncture.  Vitamin or herbal supplements. Before starting treatment, make sure to let your health care provider know if you have a personal or family history of:  Heart disease.  Breast cancer.  Blood clots.  Diabetes.  Osteoporosis. Follow these instructions at home: Lifestyle  Do not use any products that contain nicotine or tobacco, such as cigarettes and e-cigarettes. If you need help quitting, ask your health care provider.  Get at least 30 minutes of physical activity on 5 or more days each week.  Avoid alcoholic and caffeinated beverages, as well as spicy foods. This may help prevent hot flashes.  Get 7-8 hours of sleep each night.  If you have hot flashes, try: ? Dressing in layers. ? Avoiding things that may trigger hot flashes, such as spicy food, warm places, or stress. ? Taking slow, deep breaths when a hot flash starts. ? Keeping a fan in your home and office.  Find ways to manage stress, such as deep breathing, meditation, or journaling.  Consider going to group therapy with other women who are having menopause symptoms. Ask your health care provider  about recommended group therapy meetings. Eating and drinking  Eat a healthy, balanced diet that contains whole grains, lean protein, low-fat dairy, and plenty of fruits and vegetables.  Your  health care provider may recommend adding more soy to your diet. Foods that contain soy include tofu, tempeh, and soy milk.  Eat plenty of foods that contain calcium and vitamin D for bone health. Items that are rich in calcium include low-fat milk, yogurt, beans, almonds, sardines, broccoli, and kale. Medicines  Take over-the-counter and prescription medicines only as told by your health care provider.  Talk with your health care provider before starting any herbal supplements. If prescribed, take vitamins and supplements as told by your health care provider. These may include: ? Calcium. Women age 59 and older should get 1,200 mg (milligrams) of calcium every day. ? Vitamin D. Women need 600-800 International Units of vitamin D each day. ? Vitamins B12 and B6. Aim for 50 micrograms of B12 and 1.5 mg of B6 each day. General instructions  Keep track of your menstrual periods, including: ? When they occur. ? How heavy they are and how long they last. ? How much time passes between periods.  Keep track of your symptoms, noting when they start, how often you have them, and how long they last.  Use vaginal lubricants or moisturizers to help with vaginal dryness and improve comfort during sex.  Keep all follow-up visits as told by your health care provider. This is important. This includes any group therapy or counseling. Contact a health care provider if:  You are still having menstrual periods after age 26.  You have pain during sex.  You have not had a period for 12 months and you develop vaginal bleeding. Get help right away if:  You have: ? Severe depression. ? Excessive vaginal bleeding. ? Pain when you urinate. ? A fast or irregular heart beat (palpitations). ? Severe headaches. ? Abdomen (abdominal) pain or severe indigestion.  You fell and you think you have a broken bone.  You develop leg or chest pain.  You develop vision problems.  You feel a lump in your  breast. Summary  Menopause is the normal time of life when menstrual periods stop completely. It is usually confirmed by 12 months without a menstrual period.  The transition to menopause (perimenopause) most often happens between the ages of 10 and 27.  Symptoms can be managed through medicines, lifestyle changes, and complementary therapies such as acupuncture.  Eat a balanced diet that is rich in nutrients to promote bone health and heart health and to manage symptoms during menopause. This information is not intended to replace advice given to you by your health care provider. Make sure you discuss any questions you have with your health care provider. Document Revised: 04/20/2017 Document Reviewed: 06/10/2016 Elsevier Patient Education  2020 [employer].

## 2019-10-24 NOTE — Progress Notes (Signed)
Postmenopausal Bleeding Patient complains of vaginal bleeding. She has been menopausal for several years. Currently on no HRT and no recent change in meds or additionof hormones or blood thinners. Bleeding is described as less flow than a normal period and has occurred one week ago daily for 7 days, stopped yesterday. Other menopausal symptoms include: vaginal dryness (moderate, w associated dyspareunia) and vasomotor symptoms began several years ago, occur a few times per day). Workup to date: Prior episode of bleeding two years ago w neg [place], EMB, PAP.  No bleeding in 2 years until last week..  Menstrual History: OB History    Gravida  4   Para  2   Term      Preterm      AB  2   Living        SAB      TAB      Ectopic      Multiple      Live Births              Patient's last menstrual period was 10/15/2019.     PMHx: She  has a past medical history of Depression and Hypertension. Also,  has a past surgical history that includes Medial partial knee replacement (Right)., family history is not on file.,  reports that she has never smoked. She has never used smokeless tobacco. She reports that she does not drink alcohol or use drugs.  She has a current medication list which includes the following prescription(s): trazodone, amlodipine, chlorthalidone, diclofenac sodium, fluconazole, fluoxetine, meloxicam, metformin, naproxen, and [START ON 10/27/2019] replens. Also, is allergic to oxycontin [oxycodone hcl].  Review of Systems  Constitutional: Negative for chills, fever and malaise/fatigue.  HENT: Negative for congestion, sinus pain and sore throat.   Eyes: Negative for blurred vision and pain.  Respiratory: Positive for shortness of breath. Negative for cough and wheezing.   Cardiovascular: Negative for chest pain and leg swelling.  Gastrointestinal: Negative for abdominal pain, constipation, diarrhea, heartburn, nausea and vomiting.  Genitourinary: Negative for dysuria,  frequency, hematuria and urgency.  Musculoskeletal: Positive for joint pain. Negative for back pain, myalgias and neck pain.  Skin: Negative for itching and rash.  Neurological: Negative for dizziness, tremors and weakness.  Endo/Heme/Allergies: Does not bruise/bleed easily.  Psychiatric/Behavioral: Negative for depression. The patient is not nervous/anxious and does not have insomnia.     Objective: BP 130/80   Ht 5\' 4"  (1.626 m)   Wt 296 lb (134.3 kg)   LMP 10/15/2019 Comment: Patient states there is no way she is pregnant.   BMI 50.81 kg/m  Physical Exam Constitutional:      General: She is not in acute distress.    Appearance: She is well-developed.  Genitourinary:     Pelvic exam was performed with patient supine.     Vagina and uterus normal.     No vaginal erythema or bleeding.     No cervical motion tenderness, discharge, polyp or nabothian cyst.     Uterus is mobile.     Uterus is not enlarged.     No uterine mass detected.    Uterus is midaxial.     No right or left adnexal mass present.     Right adnexa not tender.     Left adnexa not tender.  HENT:     Head: Normocephalic and atraumatic.     Nose: Nose normal.  Abdominal:     General: There is no distension.  Palpations: Abdomen is soft.     Tenderness: There is no abdominal tenderness.  Musculoskeletal:        General: Normal range of motion.  Neurological:     Mental Status: She is alert and oriented to person, place, and time.     Cranial Nerves: No cranial nerve deficit.  Skin:    General: Skin is warm and dry.  Psychiatric:        Attention and Perception: Attention normal.        Mood and Affect: Mood and affect normal.        Speech: Speech normal.        Behavior: Behavior normal.        Thought Content: Thought content normal.        Judgment: Judgment normal.     ASSESSMENT/PLAN:    Problem List Items Addressed This Visit      Other   Postmenopausal bleeding - Primary   Relevant  Orders   Surgical pathology   Dyspareunia due to medical condition in female   Relevant Medications   Vaginal Lubricant (REPLENS) GEL (Start on 10/27/2019)    Intervention based on EMB results and/or frequency of bleeding episodes.  Options for D&C, Hysterectomy.  Menopause sx control discussed.  Consider non-hormonal options as desired.  None today.    Replens for vag dryness and dyspareunia  Endometrial Biopsy After discussion with the patient regarding her abnormal uterine bleeding I recommended that she proceed with an endometrial biopsy for further diagnosis. The risks, benefits, alternatives, and indications for an endometrial biopsy were discussed with the patient in detail. She understood the risks including infection, bleeding, cervical laceration and uterine perforation.  Verbal consent was obtained.   PROCEDURE NOTE:  Pipelle endometrial biopsy was performed using aseptic technique with iodine preparation.  The uterus was sounded to a length of 7 cm.  Adequate sampling was obtained with minimal blood loss.  The patient tolerated the procedure well.  Disposition will be pending pathology.  Barnett Applebaum, MD, [facility], [facility] 10/24/2019  11:30 AM

## 2019-10-27 LAB — SURGICAL PATHOLOGY

## 2019-10-27 NOTE — Progress Notes (Signed)
Let her know biopsy normal.  Let us know if keeps having bleeding.

## 2019-10-27 NOTE — Progress Notes (Signed)
Pt aware.

## 2019-12-08 ENCOUNTER — Other Ambulatory Visit: Payer: Self-pay

## 2019-12-08 ENCOUNTER — Encounter: Payer: Self-pay | Admitting: Podiatry

## 2019-12-08 ENCOUNTER — Ambulatory Visit (INDEPENDENT_AMBULATORY_CARE_PROVIDER_SITE_OTHER): Payer: Medicare Other | Admitting: Podiatry

## 2019-12-08 DIAGNOSIS — M7661 Achilles tendinitis, right leg: Secondary | ICD-10-CM

## 2019-12-08 NOTE — Progress Notes (Signed)
She presents today for follow-up of her Achilles tendinitis and surgical foot date of surgery 12/20/2018 she states that hurts on the back of the foot but not nearly as bad.  And that she gets spasms in her right calf occasionally.  Objective: Vital signs are stable she is alert and oriented x3.  Pulses are palpable.  She has tenderness on palpation of the scar posteriorly particularly the inferior aspect she has good plantar flexion against resistance.  Assessment: Well-healing surgical foot.  Ingrown toenails hallux bilateral  Plan: I helped her debride her toenails today and she will continue current therapies and current restrictions.

## 2019-12-10 DIAGNOSIS — N1831 Chronic kidney disease, stage 3a: Secondary | ICD-10-CM | POA: Insufficient documentation

## 2020-03-15 ENCOUNTER — Ambulatory Visit (INDEPENDENT_AMBULATORY_CARE_PROVIDER_SITE_OTHER): Payer: Medicare Other | Admitting: Podiatry

## 2020-03-15 ENCOUNTER — Other Ambulatory Visit: Payer: Self-pay

## 2020-03-15 ENCOUNTER — Encounter: Payer: Self-pay | Admitting: Podiatry

## 2020-03-15 DIAGNOSIS — B351 Tinea unguium: Secondary | ICD-10-CM

## 2020-03-15 DIAGNOSIS — M79676 Pain in unspecified toe(s): Secondary | ICD-10-CM

## 2020-03-15 NOTE — Progress Notes (Signed)
She presents today for follow-up of her Achilles tendinitis repair right.  States that is doing just fantastic it was done last year December 20, 2018 states is doing great no problems whatsoever she would like for me to trim her nails on a routine basis.  Objective: Vital signs are stable she alert oriented x3 no pain on palpation of the right foot and leg.  She does have thick yellow dystrophic-like mycotic nails painful palpation as well as debridement.  Assessment: Pain in limb secondary to onychomycosis well-healing surgical foot.  Plan: Debridement of toenails 1 through 5 bilaterally.

## 2020-05-22 HISTORY — PX: CATARACT EXTRACTION, BILATERAL: SHX1313

## 2020-06-16 ENCOUNTER — Other Ambulatory Visit: Payer: Self-pay

## 2020-06-16 ENCOUNTER — Ambulatory Visit (INDEPENDENT_AMBULATORY_CARE_PROVIDER_SITE_OTHER): Payer: Medicare Other | Admitting: Podiatry

## 2020-06-16 ENCOUNTER — Encounter: Payer: Self-pay | Admitting: Podiatry

## 2020-06-16 DIAGNOSIS — M79676 Pain in unspecified toe(s): Secondary | ICD-10-CM | POA: Diagnosis not present

## 2020-06-16 DIAGNOSIS — B351 Tinea unguium: Secondary | ICD-10-CM

## 2020-06-16 NOTE — Progress Notes (Signed)
She presents today chief complaint of painfully elongated toenails states that she is just recovering from Covid.  States that her surgical foot and leg are great no problems whatsoever.  Objective: Vital signs are stable she alert oriented x3 there is no erythema edema/drainage odor toenails are long thick yellow dystrophic-like mycotic.  No pain on palpation of the surgical site good plantar flexion against resistance.  Assessment: Well-healing surgical foot.  Plan: Debrided toenails 1 through 5 bilaterally.  Follow-up with me in about 3 months

## 2020-08-16 ENCOUNTER — Encounter: Payer: Self-pay | Admitting: Obstetrics and Gynecology

## 2020-08-16 ENCOUNTER — Other Ambulatory Visit: Payer: Self-pay

## 2020-08-16 ENCOUNTER — Ambulatory Visit (INDEPENDENT_AMBULATORY_CARE_PROVIDER_SITE_OTHER): Payer: Medicare Other | Admitting: Obstetrics and Gynecology

## 2020-08-16 VITALS — BP 140/90 | Ht 63.5 in | Wt 313.0 lb

## 2020-08-16 DIAGNOSIS — B373 Candidiasis of vulva and vagina: Secondary | ICD-10-CM | POA: Diagnosis not present

## 2020-08-16 DIAGNOSIS — B3731 Acute candidiasis of vulva and vagina: Secondary | ICD-10-CM

## 2020-08-16 LAB — POCT WET PREP WITH KOH
Clue Cells Wet Prep HPF POC: NEGATIVE
KOH Prep POC: NEGATIVE
Trichomonas, UA: NEGATIVE
Yeast Wet Prep HPF POC: POSITIVE

## 2020-08-16 MED ORDER — FLUCONAZOLE 150 MG PO TABS: 150.0000 mg | ORAL_TABLET | Freq: Once | ORAL | 0 refills | Status: AC

## 2020-08-16 NOTE — Progress Notes (Signed)
Bertram Gala, MD   Chief Complaint  Patient presents with  . Vaginal Irritation    Little discharge, itchiness, no odor x 1 week    HPI:      [name] is a 60 y.o. G4P0020 whose LMP was Patient's last menstrual period was 10/15/2019., presents today for increased vag d/c and irritation, feels like it's "on fire", no fishy odor, for past wk. Has been on abx for tonsillitis. No meds to treat. No urin sx, no PMB. No LBP, pelvic pain, fevers. Hx of DM.   Past Medical History:  Diagnosis Date  . Depression   . Hypertension     Past Surgical History:  Procedure Laterality Date  . MEDIAL PARTIAL KNEE REPLACEMENT Right     History reviewed. No pertinent family history.  Social History   Socioeconomic History  . Marital status: Single    Spouse name: Not on file  . Number of children: Not on file  . Years of education: Not on file  . Highest education level: Not on file  Occupational History  . Not on file  Tobacco Use  . Smoking status: Never Smoker  . Smokeless tobacco: Never Used  Vaping Use  . Vaping Use: Never used  Substance and Sexual Activity  . Alcohol use: No  . Drug use: No  . Sexual activity: Yes  Other Topics Concern  . Not on file  Social History Narrative  . Not on file   Social Determinants of Health   Financial Resource Strain: Not on file  Food Insecurity: Not on file  Transportation Needs: Not on file  Physical Activity: Not on file  Stress: Not on file  Social Connections: Not on file  Intimate Partner Violence: Not on file    Outpatient Medications Prior to Visit  Medication Sig Dispense Refill  . albuterol (VENTOLIN HFA) 108 (90 Base) MCG/ACT inhaler SMARTSIG:2 Puff(s) By Mouth Every 4 Hours PRN    . amLODipine (NORVASC) 10 MG tablet Take 10 mg by mouth daily.    . hydrochlorothiazide (HYDRODIURIL) 25 MG tablet Take 12.5 mg by mouth daily.    Marland Kitchen losartan (COZAAR) 50 MG tablet Take 25 mg by mouth daily.    .  traZODone (DESYREL) 100 MG tablet Take 100 mg by mouth at bedtime.    . chlorthalidone (HYGROTON) 25 MG tablet Take 25 mg by mouth daily.    . diclofenac Sodium (VOLTAREN) 1 % GEL Apply 2 g topically 4 (four) times daily.    . famotidine (PEPCID) 20 MG tablet Take 20 mg by mouth 2 (two) times daily as needed.    . fluconazole (DIFLUCAN) 150 MG tablet Take 2 tablets (300 mg total) by mouth once a week. (Patient not taking: Reported on 10/24/2019) 24 tablet 0  . FLUoxetine (PROZAC) 20 MG capsule Take 20 mg by mouth daily.    . meloxicam (MOBIC) 15 MG tablet Take 1 tablet (15 mg total) by mouth daily. (Patient not taking: Reported on 10/24/2019) 30 tablet 3  . metFORMIN (GLUCOPHAGE) 500 MG tablet TAKE 1 TABLET BY MOUTH WITH MORNING MEAL FOR 2 WEEKS THEN INCREASE TO ONE TABLET TWICE DAILY WITH MEALS. MAY REFILL USING RX [id number].    . naproxen (NAPROSYN) 500 MG tablet Take 1 tablet (500 mg total) by mouth 2 (two) times daily with a meal. (Patient not taking: Reported on 10/24/2019) 20 tablet 00  . Vaginal Lubricant (REPLENS) GEL Place 1 Applicatorful vaginally 2 (two) times a week. 35  g 11   No facility-administered medications prior to visit.      ROS:  Review of Systems  Constitutional: Negative for fever.  Gastrointestinal: Negative for blood in stool, constipation, diarrhea, nausea and vomiting.  Genitourinary: Positive for vaginal discharge and vaginal pain. Negative for dyspareunia, dysuria, flank pain, frequency, hematuria, urgency and vaginal bleeding.  Musculoskeletal: Negative for back pain.  Skin: Negative for rash.    OBJECTIVE:   Vitals:  BP 140/90   Ht 5' 3.5" (1.613 m)   Wt (!) 313 lb (142 kg)   LMP 10/15/2019 Comment: Patient states there is no way she is pregnant.   BMI 54.58 kg/m   Physical Exam Vitals reviewed.  Constitutional:      Appearance: She is well-developed.  Pulmonary:     Effort: Pulmonary effort is normal.  Genitourinary:    Pubic Area: No rash.       Labia:        Right: Tenderness present. No rash or lesion.        Left: Tenderness present. No rash or lesion.      Vagina: Vaginal discharge present. No erythema or tenderness.     Cervix: Normal.     Uterus: Normal. Not enlarged and not tender.      Adnexa: Right adnexa normal and left adnexa normal.       Right: No mass or tenderness.         Left: No mass or tenderness.       Comments: BILAT LABIA MINORA WITH ERYTHEMA/VAG D/C Musculoskeletal:        General: Normal range of motion.     Cervical back: Normal range of motion.  Skin:    General: Skin is warm and dry.  Neurological:     General: No focal deficit present.     Mental Status: She is alert and oriented to person, place, and time.  Psychiatric:        Mood and Affect: Mood normal.        Behavior: Behavior normal.        Thought Content: Thought content normal.        Judgment: Judgment normal.     Results: Results for orders placed or performed in visit on 08/16/20 (from the past 24 hour(s))  POCT Wet Prep with KOH     Status: Normal   Collection Time: 08/16/20  3:15 PM  Result Value Ref Range   Trichomonas, UA Negative    Clue Cells Wet Prep HPF POC neg    Epithelial Wet Prep HPF POC     Yeast Wet Prep HPF POC pos    Bacteria Wet Prep HPF POC     RBC Wet Prep HPF POC     WBC Wet Prep HPF POC     KOH Prep POC Negative Negative     Assessment/Plan: Candidal vaginitis - Plan: fluconazole (DIFLUCAN) 150 MG tablet, POCT Wet Prep with KOH; pos sx and exam, pos wet prep. Rx diflucan x 2. Cool compresses/OTC hydrocortisone crm prn. F/u prn.    Meds ordered this encounter  Medications  . fluconazole (DIFLUCAN) 150 MG tablet    Sig: Take 1 tablet (150 mg total) by mouth once for 1 dose. May repeat in 3 days if still having symptoms    Dispense:  2 tablet    Refill:  0    Order Specific Question:   Supervising Provider    Answer:   Nadara Mustard [[id number]]  Return if symptoms worsen or fail to  improve.  Alicia B. Copland, PA-C 08/16/2020 3:16 PM

## 2020-08-16 NOTE — Patient Instructions (Signed)
I value your feedback and you entrusting us with your care. If you get a [facility] patient survey, I would appreciate you taking the time to let us know about your experience today. Thank you! ? ? ?

## 2020-09-15 ENCOUNTER — Ambulatory Visit (INDEPENDENT_AMBULATORY_CARE_PROVIDER_SITE_OTHER): Payer: Medicare Other | Admitting: Podiatry

## 2020-09-15 ENCOUNTER — Encounter: Payer: Self-pay | Admitting: Podiatry

## 2020-09-15 ENCOUNTER — Other Ambulatory Visit: Payer: Self-pay

## 2020-09-15 DIAGNOSIS — B351 Tinea unguium: Secondary | ICD-10-CM | POA: Diagnosis not present

## 2020-09-15 DIAGNOSIS — M79676 Pain in unspecified toe(s): Secondary | ICD-10-CM | POA: Diagnosis not present

## 2020-09-15 NOTE — Progress Notes (Signed)
She presents today chief complaint of painfully elongated toenails.  Objective: Pulses remain palpable.  Toenails are long thick yellow dystrophic onychomycotic sharply incurvated and painful.  Assessment: Pain in limb secondary to onychomycosis and onychocryptosis.  Plan: Debridement of toenails 1 through 5 bilateral.

## 2020-09-21 ENCOUNTER — Ambulatory Visit: Payer: Medicare Other | Attending: Otolaryngology

## 2020-09-21 DIAGNOSIS — G4733 Obstructive sleep apnea (adult) (pediatric): Secondary | ICD-10-CM | POA: Diagnosis not present

## 2020-09-22 ENCOUNTER — Other Ambulatory Visit: Payer: Self-pay

## 2020-12-15 ENCOUNTER — Ambulatory Visit: Payer: Medicare Other | Admitting: Podiatry

## 2021-06-06 ENCOUNTER — Other Ambulatory Visit: Payer: Self-pay

## 2021-06-06 ENCOUNTER — Encounter: Payer: Self-pay | Admitting: Licensed Practical Nurse

## 2021-06-06 ENCOUNTER — Ambulatory Visit
Admission: RE | Admit: 2021-06-06 | Discharge: 2021-06-06 | Disposition: A | Discharge status: Home / Self Care | Payer: Medicare Other | Source: Ambulatory Visit | Attending: Licensed Practical Nurse | Admitting: Licensed Practical Nurse

## 2021-06-06 ENCOUNTER — Ambulatory Visit (INDEPENDENT_AMBULATORY_CARE_PROVIDER_SITE_OTHER): Payer: Medicare Other | Admitting: Licensed Practical Nurse

## 2021-06-06 VITALS — BP 150/90 | Ht 63.0 in | Wt 291.0 lb

## 2021-06-06 DIAGNOSIS — N950 Postmenopausal bleeding: Secondary | ICD-10-CM | POA: Diagnosis not present

## 2021-06-06 NOTE — Progress Notes (Signed)
°  HPI:      [name] is a 61 y.o. [phone] who is postmenopausal, presents today for a problem visit.  She complains of vaginal bleeding.  The bleeding started at the end of December than stopped in the beginning of January, but then she started bleeding this past Saturday and continues to bleed.  The bleeding is heavy at times.  She did have cramping right before the start of the bleed on Saturday. Her LMP is 15-20 years ago. She is not on any hormones.  Symptoms are mod to severe and has led her to come in today to seek options for intervention. [name] has a tonsillectomy scheduled for next week and would like to address this concern ASAP.  Previous Treatment: she thinks she has had an endometrial biopsy in the past   She is not sexually active. Denies PostMenopausal Bleeding  PMHx: She  has a past medical history of Depression and Hypertension. Also,  has a past surgical history that includes Medial partial knee replacement (Right)., family history is not on file.,  reports that she has never smoked. She has never used smokeless tobacco. She reports that she does not drink alcohol and does not use drugs.  She has a current medication list which includes the following prescription(s): albuterol, atorvastatin, hydrochlorothiazide, losartan, and trazodone. Also, is allergic to oxycontin [oxycodone hcl].  Review of Systems  Constitutional:  Positive for weight loss.  HENT:  Positive for congestion.   Eyes:        Changes in vision   Respiratory:  Positive for shortness of breath.   Genitourinary:        Vaginal bleeding   Neurological:  Positive for headaches.  Endo/Heme/Allergies:        Hot flashes   Psychiatric/Behavioral:  Positive for depression.    Objective: BP (!) 150/90    Ht 5\' 3"  (1.6 m)    Wt 291 lb (132 kg)    LMP 10/15/2019 Comment: Patient states there is no way she is pregnant.    BMI 51.55 kg/m  Physical Exam Constitutional:      Appearance: Normal  appearance.  Genitourinary:     Vulva normal.     Genitourinary Comments: SPEC exam; cervix pink, no lesions, Small amount of menses like discharge present Vaginal tissue WNL   Pulmonary:     Effort: Pulmonary effort is normal.  Abdominal:     Tenderness: There is no abdominal tenderness.  Neurological:     Mental Status: She is alert and oriented to person, place, and time.  Psychiatric:        Mood and Affect: Mood normal.    ASSESSMENT/PLAN:  post menopausal bleeding    Pelvic 10/17/2019 ordered Fu with MD for endometrial biopsy

## 2021-06-07 ENCOUNTER — Telehealth: Payer: Self-pay | Admitting: Licensed Practical Nurse

## 2021-06-07 ENCOUNTER — Encounter
Admission: RE | Admit: 2021-06-07 | Discharge: 2021-06-07 | Disposition: A | Discharge status: Home / Self Care | Payer: Medicare Other | Source: Ambulatory Visit | Attending: Otolaryngology | Admitting: Otolaryngology

## 2021-06-07 VITALS — Ht 63.0 in | Wt 291.0 lb

## 2021-06-07 DIAGNOSIS — Z01812 Encounter for preprocedural laboratory examination: Secondary | ICD-10-CM

## 2021-06-07 DIAGNOSIS — R7303 Prediabetes: Secondary | ICD-10-CM

## 2021-06-07 DIAGNOSIS — I10 Essential (primary) hypertension: Secondary | ICD-10-CM

## 2021-06-07 HISTORY — DX: Morbid (severe) obesity due to excess calories: E66.01

## 2021-06-07 HISTORY — DX: Bilateral primary osteoarthritis of knee: M17.0

## 2021-06-07 HISTORY — DX: Obstructive sleep apnea (adult) (pediatric): G47.33

## 2021-06-07 HISTORY — DX: Unspecified cataract: H26.9

## 2021-06-07 HISTORY — DX: Prediabetes: R73.03

## 2021-06-07 NOTE — Telephone Encounter (Signed)
TC to [facility] Reviewed [place] results, your uterine lining is thicker than expected for a person who has gone through menopause. The next step is to have an endometrial biopsy to figure out the lining is thick.  Please call the front desk to schedule to an endometrial biopsy with one of the doctors.  Leslie Bautista verbalized understanding.  Message sent to front desk staff to be aware that the pt will be calling. Carie Caddy, CNM  [facility], [facility]  @TODAY @  8:47 AM

## 2021-06-07 NOTE — Patient Instructions (Addendum)
Your procedure is scheduled on: Tuesday, January 24 Report to the Registration Desk on the 1st floor of the [facility] To find out your arrival time, please call [phone] between 1PM - 3PM on: Monday, January 23  REMEMBER: Instructions that are not followed completely may result in serious medical risk, up to and including death; or upon the discretion of your surgeon and anesthesiologist your surgery may need to be rescheduled.  Do not eat food after midnight the night before surgery.  No gum chewing, lozengers or hard candies.  You may however, drink water up to 2 hours before you are scheduled to arrive for your surgery. Do not drink anything within 2 hours of your scheduled arrival time.  TAKE THESE MEDICATIONS THE MORNING OF SURGERY:  Albuterol inhaler  Use inhalers on the day of surgery and bring to the hospital.  One week prior to surgery: STARTING January 17 Stop Anti-inflammatories (NSAIDS) such as Advil, Aleve, Ibuprofen, Motrin, Naproxen, Naprosyn and Aspirin based products such as Excedrin, Goodys Powder, BC Powder. Stop ANY OVER THE COUNTER supplements until after surgery. You may however, continue to take Tylenol if needed for pain up until the day of surgery.  No Alcohol for 24 hours before or after surgery.  No Smoking including e-cigarettes for 24 hours prior to surgery.  No chewable tobacco products for at least 6 hours prior to surgery.  No nicotine patches on the day of surgery.  Do not use any "recreational" drugs for at least a week prior to your surgery.  Please be advised that the combination of cocaine and anesthesia may have negative outcomes, up to and including death. If you test positive for cocaine, your surgery will be cancelled.  On the morning of surgery brush your teeth with toothpaste and water, you may rinse your mouth with mouthwash if you wish. Do not swallow any toothpaste or mouthwash.  Do not wear jewelry, make-up, hairpins, clips  or nail polish.  Do not wear lotions, powders, or perfumes.   Contact lenses, hearing aids and dentures may not be worn into surgery.  Do not bring valuables to the hospital. [facility] is not responsible for any missing/lost belongings or valuables.   Bring your C-PAP to the hospital with you in case you may have to spend the night.   Notify your doctor if there is any change in your medical condition (cold, fever, infection).  Wear comfortable clothing (specific to your surgery type) to the hospital.  After surgery, you can help prevent lung complications by doing breathing exercises.  Take deep breaths and cough every 1-2 hours. Your doctor may order a device called an Incentive Spirometer to help you take deep breaths.  If you are being discharged the day of surgery, you will not be allowed to drive home. You will need a responsible adult (18 years or older) to drive you home and stay with you that night.   If you are taking public transportation, you will need to have a responsible adult (18 years or older) with you. Please confirm with your physician that it is acceptable to use public transportation.   Please call the Pre-admissions Testing Dept. at [phone] if you have any questions about these instructions.  Surgery Visitation Policy:  Patients undergoing a surgery or procedure may have one family member or support person with them as long as that person is not COVID-19 positive or experiencing its symptoms.  That person may remain in the waiting area during  the procedure and may rotate out with other people.

## 2021-06-08 ENCOUNTER — Other Ambulatory Visit: Payer: Self-pay

## 2021-06-08 ENCOUNTER — Encounter
Admission: RE | Admit: 2021-06-08 | Discharge: 2021-06-08 | Disposition: A | Discharge status: Home / Self Care | Payer: Medicare Other | Source: Ambulatory Visit | Attending: Otolaryngology | Admitting: Otolaryngology

## 2021-06-08 DIAGNOSIS — I10 Essential (primary) hypertension: Secondary | ICD-10-CM | POA: Insufficient documentation

## 2021-06-08 DIAGNOSIS — Z01818 Encounter for other preprocedural examination: Secondary | ICD-10-CM | POA: Insufficient documentation

## 2021-06-08 DIAGNOSIS — R7303 Prediabetes: Secondary | ICD-10-CM | POA: Diagnosis not present

## 2021-06-08 DIAGNOSIS — Z01810 Encounter for preprocedural cardiovascular examination: Secondary | ICD-10-CM | POA: Diagnosis not present

## 2021-06-08 DIAGNOSIS — Z01812 Encounter for preprocedural laboratory examination: Secondary | ICD-10-CM

## 2021-06-08 LAB — POTASSIUM: Potassium: 3.5 mmol/L (ref 3.5–5.1)

## 2021-06-14 ENCOUNTER — Ambulatory Visit: Payer: Medicare Other | Admitting: Anesthesiology

## 2021-06-14 ENCOUNTER — Other Ambulatory Visit: Payer: Self-pay

## 2021-06-14 ENCOUNTER — Encounter
Admission: RE | Disposition: A | Discharge status: Home / Self Care | Payer: Self-pay | Source: Home / Self Care | Attending: Otolaryngology

## 2021-06-14 ENCOUNTER — Ambulatory Visit
Admission: RE | Admit: 2021-06-14 | Discharge: 2021-06-14 | Disposition: A | Discharge status: Home / Self Care | Payer: Medicare Other | Attending: Otolaryngology | Admitting: Otolaryngology

## 2021-06-14 ENCOUNTER — Encounter: Payer: Self-pay | Admitting: Otolaryngology

## 2021-06-14 DIAGNOSIS — F32A Depression, unspecified: Secondary | ICD-10-CM | POA: Diagnosis not present

## 2021-06-14 DIAGNOSIS — Z6841 Body Mass Index (BMI) 40.0 and over, adult: Secondary | ICD-10-CM | POA: Insufficient documentation

## 2021-06-14 DIAGNOSIS — J351 Hypertrophy of tonsils: Secondary | ICD-10-CM | POA: Insufficient documentation

## 2021-06-14 DIAGNOSIS — I493 Ventricular premature depolarization: Secondary | ICD-10-CM | POA: Insufficient documentation

## 2021-06-14 DIAGNOSIS — R7303 Prediabetes: Secondary | ICD-10-CM | POA: Insufficient documentation

## 2021-06-14 DIAGNOSIS — G4733 Obstructive sleep apnea (adult) (pediatric): Secondary | ICD-10-CM | POA: Diagnosis not present

## 2021-06-14 DIAGNOSIS — I10 Essential (primary) hypertension: Secondary | ICD-10-CM | POA: Diagnosis not present

## 2021-06-14 HISTORY — PX: TONSILLECTOMY: SHX5217

## 2021-06-14 LAB — BASIC METABOLIC PANEL
Anion gap: 10 (ref 5–15)
BUN: 15 mg/dL (ref 6–20)
CO2: 24 mmol/L (ref 22–32)
Calcium: 9.2 mg/dL (ref 8.9–10.3)
Chloride: 102 mmol/L (ref 98–111)
Creatinine, Ser: 1.04 mg/dL — ABNORMAL HIGH (ref 0.44–1.00)
GFR, Estimated: >60 mL/min (ref >60)
Glucose, Bld: 201 mg/dL — ABNORMAL HIGH (ref 70–99)
Potassium: 2.9 mmol/L — ABNORMAL LOW (ref 3.5–5.1)
Sodium: 136 mmol/L (ref 135–145)

## 2021-06-14 LAB — MAGNESIUM: Magnesium: 1.7 mg/dL (ref 1.7–2.4)

## 2021-06-14 SURGERY — TONSILLECTOMY
Anesthesia: General | Laterality: Bilateral

## 2021-06-14 MED ORDER — LIDOCAINE HCL (CARDIAC) PF 100 MG/5ML IV SOSY
PREFILLED_SYRINGE | INTRAVENOUS | Status: DC | PRN
  Administered 2021-06-14: 100 mg via INTRAVENOUS

## 2021-06-14 MED ORDER — OXYMETAZOLINE HCL 0.05 % NA SOLN
NASAL | Status: AC
  Filled 2021-06-14: qty 30

## 2021-06-14 MED ORDER — MIDAZOLAM HCL 2 MG/2ML IJ SOLN
INTRAMUSCULAR | Status: AC
  Filled 2021-06-14: qty 2

## 2021-06-14 MED ORDER — OXYMETAZOLINE HCL 0.05 % NA SOLN
NASAL | Status: DC | PRN
  Administered 2021-06-14: 1

## 2021-06-14 MED ORDER — ONDANSETRON HCL 4 MG/2ML IJ SOLN
INTRAMUSCULAR | Status: AC
  Administered 2021-06-14: 13:00:00 4 mg via INTRAVENOUS
  Filled 2021-06-14: qty 2

## 2021-06-14 MED ORDER — ONDANSETRON HCL 4 MG/2ML IJ SOLN: 4.0000 mg | Freq: Once | INTRAMUSCULAR | Status: AC | PRN

## 2021-06-14 MED ORDER — 0.9 % SODIUM CHLORIDE (POUR BTL) OPTIME
TOPICAL | Status: DC | PRN
  Administered 2021-06-14: 10:00:00 500 mL

## 2021-06-14 MED ORDER — KETAMINE HCL 10 MG/ML IJ SOLN
INTRAMUSCULAR | Status: DC | PRN
  Administered 2021-06-14: 20 mg via INTRAVENOUS

## 2021-06-14 MED ORDER — DEXMEDETOMIDINE (PRECEDEX) IN NS 20 MCG/5ML (4 MCG/ML) IV SYRINGE
PREFILLED_SYRINGE | INTRAVENOUS | Status: DC | PRN
  Administered 2021-06-14: 8 ug via INTRAVENOUS
  Administered 2021-06-14: 4 ug via INTRAVENOUS
  Administered 2021-06-14: 8 ug via INTRAVENOUS

## 2021-06-14 MED ORDER — SUGAMMADEX SODIUM 500 MG/5ML IV SOLN
INTRAVENOUS | Status: AC
  Filled 2021-06-14: qty 5

## 2021-06-14 MED ORDER — FENTANYL CITRATE (PF) 100 MCG/2ML IJ SOLN
25.0000 ug | INTRAMUSCULAR | Status: DC | PRN
  Administered 2021-06-14: 12:00:00 50 ug via INTRAVENOUS

## 2021-06-14 MED ORDER — FENTANYL CITRATE (PF) 100 MCG/2ML IJ SOLN
INTRAMUSCULAR | Status: AC
  Filled 2021-06-14: qty 2

## 2021-06-14 MED ORDER — FENTANYL CITRATE (PF) 100 MCG/2ML IJ SOLN
INTRAMUSCULAR | Status: AC
  Administered 2021-06-14: 12:00:00 50 ug via INTRAVENOUS
  Filled 2021-06-14: qty 2

## 2021-06-14 MED ORDER — FENTANYL CITRATE (PF) 100 MCG/2ML IJ SOLN
INTRAMUSCULAR | Status: DC | PRN
Start: 2021-06-14 — End: 2021-06-14
  Administered 2021-06-14 (×2): 50 ug via INTRAVENOUS

## 2021-06-14 MED ORDER — LACTATED RINGERS IV SOLN: INTRAVENOUS | Status: DC

## 2021-06-14 MED ORDER — HYDRALAZINE HCL 20 MG/ML IJ SOLN
INTRAMUSCULAR | Status: AC
  Filled 2021-06-14: qty 1

## 2021-06-14 MED ORDER — FAMOTIDINE 20 MG PO TABS: 20.0000 mg | ORAL_TABLET | Freq: Once | ORAL | Status: AC

## 2021-06-14 MED ORDER — BUPIVACAINE HCL (PF) 0.5 % IJ SOLN
INTRAMUSCULAR | Status: AC
  Filled 2021-06-14: qty 30

## 2021-06-14 MED ORDER — DEXAMETHASONE SODIUM PHOSPHATE 10 MG/ML IJ SOLN: INTRAMUSCULAR | Status: DC | PRN

## 2021-06-14 MED ORDER — ORAL CARE MOUTH RINSE: 15.0000 mL | Freq: Once | OROMUCOSAL | Status: AC

## 2021-06-14 MED ORDER — ROCURONIUM BROMIDE 10 MG/ML (PF) SYRINGE
PREFILLED_SYRINGE | INTRAVENOUS | Status: AC
  Filled 2021-06-14: qty 10

## 2021-06-14 MED ORDER — ACETAMINOPHEN 10 MG/ML IV SOLN
INTRAVENOUS | Status: AC
  Filled 2021-06-14: qty 100

## 2021-06-14 MED ORDER — ACETAMINOPHEN 10 MG/ML IV SOLN: 1000.0000 mg | Freq: Once | INTRAVENOUS | Status: DC | PRN

## 2021-06-14 MED ORDER — POTASSIUM CHLORIDE 20 MEQ PO PACK
40.0000 meq | PACK | Freq: Once | ORAL | Status: AC
  Administered 2021-06-14: 16:00:00 40 meq via ORAL
  Filled 2021-06-14 (×9): qty 2

## 2021-06-14 MED ORDER — ROCURONIUM BROMIDE 100 MG/10ML IV SOLN
INTRAVENOUS | Status: DC | PRN
  Administered 2021-06-14: 10 mg via INTRAVENOUS
  Administered 2021-06-14: 50 mg via INTRAVENOUS

## 2021-06-14 MED ORDER — DEXMEDETOMIDINE (PRECEDEX) IN NS 20 MCG/5ML (4 MCG/ML) IV SYRINGE
PREFILLED_SYRINGE | INTRAVENOUS | Status: AC
  Filled 2021-06-14: qty 5

## 2021-06-14 MED ORDER — ONDANSETRON HCL 4 MG PO TABS: 4.0000 mg | ORAL_TABLET | Freq: Three times a day (TID) | ORAL | 0 refills | Status: DC | PRN

## 2021-06-14 MED ORDER — SUGAMMADEX SODIUM 500 MG/5ML IV SOLN
INTRAVENOUS | Status: DC | PRN
  Administered 2021-06-14: 300 mg via INTRAVENOUS

## 2021-06-14 MED ORDER — HYDRALAZINE HCL 20 MG/ML IJ SOLN
10.0000 mg | Freq: Once | INTRAMUSCULAR | Status: AC
Start: 2021-06-14 — End: 2021-06-14
  Administered 2021-06-14: 11:00:00 10 mg via INTRAVENOUS

## 2021-06-14 MED ORDER — LIDOCAINE VISCOUS HCL 2 % MT SOLN: 10.0000 mL | Freq: Four times a day (QID) | OROMUCOSAL | 0 refills | Status: DC | PRN

## 2021-06-14 MED ORDER — DEXAMETHASONE SODIUM PHOSPHATE 10 MG/ML IJ SOLN
INTRAMUSCULAR | Status: DC | PRN
  Administered 2021-06-14: 8 mg via INTRAVENOUS

## 2021-06-14 MED ORDER — POTASSIUM CHLORIDE ER 10 MEQ PO TBCR
40.0000 meq | EXTENDED_RELEASE_TABLET | Freq: Every day | ORAL | Status: DC
Start: 2021-06-14 — End: 2021-06-14

## 2021-06-14 MED ORDER — MIDAZOLAM HCL 2 MG/2ML IJ SOLN
INTRAMUSCULAR | Status: DC | PRN
  Administered 2021-06-14: 2 mg via INTRAVENOUS

## 2021-06-14 MED ORDER — PROMETHAZINE HCL 25 MG/ML IJ SOLN
INTRAMUSCULAR | Status: AC
  Administered 2021-06-14: 15:00:00 6.25 mg via INTRAVENOUS
  Filled 2021-06-14: qty 1

## 2021-06-14 MED ORDER — OXYCODONE HCL 5 MG/5ML PO SOLN
ORAL | Status: AC
  Filled 2021-06-14: qty 5

## 2021-06-14 MED ORDER — CHLORHEXIDINE GLUCONATE 0.12 % MT SOLN
OROMUCOSAL | Status: AC
  Administered 2021-06-14: 08:00:00 15 mL via OROMUCOSAL
  Filled 2021-06-14: qty 15

## 2021-06-14 MED ORDER — ACETAMINOPHEN 10 MG/ML IV SOLN
INTRAVENOUS | Status: DC | PRN
  Administered 2021-06-14: 1000 mg via INTRAVENOUS

## 2021-06-14 MED ORDER — ONDANSETRON HCL 4 MG/2ML IJ SOLN
INTRAMUSCULAR | Status: AC
  Filled 2021-06-14: qty 2

## 2021-06-14 MED ORDER — PROPOFOL 10 MG/ML IV BOLUS
INTRAVENOUS | Status: DC | PRN
  Administered 2021-06-14: 200 mg via INTRAVENOUS

## 2021-06-14 MED ORDER — ONDANSETRON HCL 4 MG/2ML IJ SOLN
INTRAMUSCULAR | Status: DC | PRN
  Administered 2021-06-14: 4 mg via INTRAVENOUS

## 2021-06-14 MED ORDER — FAMOTIDINE 20 MG PO TABS
ORAL_TABLET | ORAL | Status: AC
  Administered 2021-06-14: 08:00:00 20 mg via ORAL
  Filled 2021-06-14: qty 1

## 2021-06-14 MED ORDER — HYDROCODONE-ACETAMINOPHEN 7.5-325 MG/15ML PO SOLN: 10.0000 mL | Freq: Four times a day (QID) | ORAL | 0 refills | Status: DC | PRN

## 2021-06-14 MED ORDER — PROPOFOL 10 MG/ML IV BOLUS
INTRAVENOUS | Status: AC
  Filled 2021-06-14: qty 20

## 2021-06-14 MED ORDER — CHLORHEXIDINE GLUCONATE 0.12 % MT SOLN: 15.0000 mL | Freq: Once | OROMUCOSAL | Status: AC

## 2021-06-14 MED ORDER — SUCCINYLCHOLINE CHLORIDE 200 MG/10ML IV SOSY
PREFILLED_SYRINGE | INTRAVENOUS | Status: AC
  Filled 2021-06-14: qty 10

## 2021-06-14 MED ORDER — HYDROMORPHONE HCL 1 MG/ML IJ SOLN
0.5000 mg | INTRAMUSCULAR | Status: DC | PRN
  Administered 2021-06-14: 14:00:00 0.5 mg via INTRAVENOUS

## 2021-06-14 MED ORDER — PROMETHAZINE HCL 25 MG/ML IJ SOLN: 6.2500 mg | Freq: Once | INTRAMUSCULAR | Status: AC

## 2021-06-14 MED ORDER — HYDROMORPHONE HCL 1 MG/ML IJ SOLN
INTRAMUSCULAR | Status: AC
  Administered 2021-06-14: 14:00:00 0.5 mg via INTRAVENOUS
  Filled 2021-06-14: qty 1

## 2021-06-14 MED ORDER — BUPIVACAINE HCL 0.5 % IJ SOLN
INTRAMUSCULAR | Status: DC | PRN
  Administered 2021-06-14: 3 mL

## 2021-06-14 MED ORDER — KETAMINE HCL 50 MG/5ML IJ SOSY
PREFILLED_SYRINGE | INTRAMUSCULAR | Status: AC
  Filled 2021-06-14: qty 5

## 2021-06-14 MED ORDER — HYDROMORPHONE HCL 1 MG/ML IJ SOLN
INTRAMUSCULAR | Status: AC
  Administered 2021-06-14: 13:00:00 0.5 mg via INTRAVENOUS
  Filled 2021-06-14: qty 1

## 2021-06-14 SURGICAL SUPPLY — 17 items
BLADE BOVIE TIP EXT 4 (BLADE) ×3 IMPLANT
CATH ROBINSON RED A/P 10FR (CATHETERS) ×3 IMPLANT
CATH ROBINSON RED A/P 14FR (CATHETERS) ×3 IMPLANT
COAG SUCT 10F 3.5MM HAND CTRL (MISCELLANEOUS) ×3 IMPLANT
ELECT REM PT RETURN 9FT ADLT (ELECTROSURGICAL) ×3
ELECTRODE REM PT RTRN 9FT ADLT (ELECTROSURGICAL) ×1 IMPLANT
GAUZE 4X4 16PLY ~~LOC~~+RFID DBL (SPONGE) ×3 IMPLANT
GLOVE SURG ENC TEXT LTX SZ7.5 (GLOVE) ×3 IMPLANT
HANDLE SUCTION POOLE (INSTRUMENTS) ×1 IMPLANT
KIT TURNOVER KIT A (KITS) ×3 IMPLANT
MANIFOLD NEPTUNE II (INSTRUMENTS) ×3 IMPLANT
NS IRRIG 500ML POUR BTL (IV SOLUTION) ×3 IMPLANT
PACK HEAD/NECK (MISCELLANEOUS) ×3 IMPLANT
SPONGE TONSIL 1 RF SGL (DISPOSABLE) ×3 IMPLANT
SUCTION POOLE HANDLE (INSTRUMENTS) ×3
SYR 3ML LL SCALE MARK (SYRINGE) ×3 IMPLANT
WATER STERILE IRR 500ML POUR (IV SOLUTION) ×1 IMPLANT

## 2021-06-14 NOTE — Anesthesia Postprocedure Evaluation (Signed)
Anesthesia Post Note  Patient: Leslie Bautista  Procedure(s) Performed: TONSILLECTOMY (Bilateral)  Patient location during evaluation: PACU Anesthesia Type: General Level of consciousness: awake and alert Pain management: pain level controlled Vital Signs Assessment: post-procedure vital signs reviewed and stable Respiratory status: spontaneous breathing, nonlabored ventilation, respiratory function stable and patient connected to nasal cannula oxygen Cardiovascular status: blood pressure returned to baseline and stable Postop Assessment: no apparent nausea or vomiting Anesthetic complications: no Comments: Concerns for new bigeminy postop. This has improved to trigeminy - occasional PVCs. Dr Mariah Milling, cardiologist, consulted for advice. Electrolytes mildly deficient. No reason to keep her in the hospital for repletion as she is otherwise asymptomatic. Dr Mariah Milling advised (which I relayed to the patient) to take over-the-counter potassium and magnesium supplement pills, 1-2pills per day, until she can follow up with her PCP for definitive management. I told the patient to seek emergency care if she develops any troubling symptoms of worsening arrhytmia such as severe palpitations, chest pain, shortness of breath, syncope. Patient understands. Will provide of potassium orally here prior to discharge.   No notable events documented.   Last Vitals:  Vitals:   06/14/21 1445 06/14/21 1500  BP: (!) 144/48 (!) 164/88  Pulse: 81 (!) 117  Resp: (!) 27 18  Temp:    SpO2:  100%    Last Pain:  Vitals:   06/14/21 1412  TempSrc:   PainSc: 7                  Corinda Gubler

## 2021-06-14 NOTE — Op Note (Signed)
..  06/14/2021  10:22 AM    Leslie Bautista  [id number]   Pre-Op Dx:  obstructive sleep apnea, tonsil hypertrophy, tonsillolithiasis  Post-op Dx: same  Proc:Tonsillectomy > age 61  Surg: Aldena Worm  Anes:  General Endotracheal  EBL:  <92ml  Comp:  None  Findings:  3+ tonsils with tonsillolithiasis and crypts  Indications:  61 y.o. female with history of severe OSA with AHI of 70 on CPAP but difficulty tolerating and wishes to proceed with tonsillectomy to potentially lower pressures and resolve her tonsil stones.  Patient understands that tonsillectomy with help her OSA with lowering her AHI but that she will still have OSA and need CPAP even following the procedure.  She also understands her increased risk of bleeding due to age and need for post-operative CPAP.  Prior to surgery most of patient's OSA obstruction is base of tongue.  Procedure: After the patient was identified in holding and the history and physical and consent was reviewed, the patient was taken to the operating room and placed in a supine position.  General endotracheal anesthesia was induced in the normal fashion.  At this time, the patient was rotated 45 degrees and a shoulder roll was placed.  At this time, a McIvor mouthgag was inserted into the patient's oral cavity and suspended from the Mayo stand without injury to teeth, lips, or gums.  Next a red rubber catheter was inserted into the patient left nostril for retraction of the uvula and soft palate superiorly.  Next a curved Alice clamp was attached to the patient's right superior tonsillar pole and retracted medially and inferiorly.  A Bovie electrocautery was used to dissect the patient's right tonsil in a subcapsular plane.  Meticulous hemostasis was achieved with Bovie suction cautery.  At this time, the mouth gag was released from suspension for 1 minute.  Attention now was directed to the patient's left side.  In a similar fashion the curved Alice  clamp was attached to the superior pole and this was retracted medially and inferiorly and the tonsil was excised in a subcapsular plane with Bovie electrocautery.  After completion of the second tonsil, meticulous hemostasis was continued.  At this time, the patient's nasal cavity and oral cavity was irrigated with sterile saline.  3 ml of 0.5% Marcaine was injected into the anterior and posterior tonsillar fossa bilaterally.  Following this, the care of patient was returned to anesthesia, awakened, and transferred to recovery in stable condition.  Dispo:  PACU to home  Plan: Soft diet.  Limit exercise and strenuous activity for 2 weeks.  Fluid hydration  Recheck my office three weeks.   Roney Mans Claudia Alvizo 10:22 AM 06/14/2021

## 2021-06-14 NOTE — Anesthesia Preprocedure Evaluation (Addendum)
Anesthesia Evaluation  Patient identified by MRN, date of birth, ID band Patient awake    Reviewed: Allergy & Precautions, NPO status , Patient's Chart, lab work & pertinent test results  History of Anesthesia Complications Negative for: history of anesthetic complications  Airway Mallampati: III   Neck ROM: Full    Dental   Missing molars:   Pulmonary sleep apnea ,    Pulmonary exam normal breath sounds clear to auscultation       Cardiovascular hypertension, Normal cardiovascular exam Rhythm:Regular Rate:Normal  ECG 06/08/21:  Sinus bradycardia (HR 51) with sinus arrhythmia Otherwise normal ECG When compared with ECG of 05-Aug-2014 17:12, No significant change was found  Echo 02/09/18:   Technically difficult study due to chest wall/lung interference   Normal left ventricular systolic function, ejection fraction 60 to 65%   Mitral annular calcification   Normal right ventricular systolic function   Neuro/Psych PSYCHIATRIC DISORDERS Depression negative neurological ROS     GI/Hepatic negative GI ROS,   Endo/Other  Class 3 obesity, prediabetes  Renal/GU negative Renal ROS     Musculoskeletal  (+) Arthritis ,   Abdominal   Peds  Hematology negative hematology ROS (+)   Anesthesia Other Findings   Reproductive/Obstetrics                            Anesthesia Physical Anesthesia Plan  ASA: 3  Anesthesia Plan: General   Post-op Pain Management:    Induction: Intravenous  PONV Risk Score and Plan: 3 and Ondansetron, Dexamethasone and Treatment may vary due to age or medical condition  Airway Management Planned: Oral ETT  Additional Equipment:   Intra-op Plan:   Post-operative Plan: Extubation in OR  Informed Consent: I have reviewed the patients History and Physical, chart, labs and discussed the procedure including the risks, benefits and alternatives for the proposed  anesthesia with the patient or authorized representative who has indicated his/her understanding and acceptance.     Dental advisory given  Plan Discussed with: CRNA  Anesthesia Plan Comments: (Patient consented for risks of anesthesia including but not limited to:  - adverse reactions to medications - damage to eyes, teeth, lips or other oral mucosa - nerve damage due to positioning  - sore throat or hoarseness - damage to heart, brain, nerves, lungs, other parts of body or loss of life  Informed patient about role of CRNA in peri- and intra-operative care.  Patient voiced understanding.)        Anesthesia Quick Evaluation

## 2021-06-14 NOTE — Progress Notes (Signed)
Dr. Arta Bruce and anesthesia made available of potassium 2.9

## 2021-06-14 NOTE — Transfer of Care (Signed)
Immediate Anesthesia Transfer of Care Note  Patient: Leslie Bautista  Procedure(s) Performed: TONSILLECTOMY (Bilateral)  Patient Location: PACU  Anesthesia Type:General  Level of Consciousness: drowsy  Airway & Oxygen Therapy: Patient Spontanous Breathing and Patient connected to face mask oxygen  Post-op Assessment: Report given to RN and Post -op Vital signs reviewed and stable  Post vital signs: Reviewed and stable  Last Vitals:  Vitals Value Taken Time  BP 184/93 06/14/21 1040  Temp    Pulse 56 06/14/21 1043  Resp 17 06/14/21 1043  SpO2 100 % 06/14/21 1043  Vitals shown include unvalidated device data.  Last Pain:  Vitals:   06/14/21 0755  TempSrc: Temporal  PainSc: 0-No pain         Complications: No notable events documented.

## 2021-06-14 NOTE — Discharge Instructions (Addendum)
AMBULATORY SURGERY  DISCHARGE INSTRUCTIONS   The drugs that you were given will stay in your system until tomorrow so for the next 24 hours you should not:  Drive an automobile Make any legal decisions Drink any alcoholic beverage   You may resume regular meals tomorrow.  Today it is better to start with liquids and gradually work up to solid foods.  You may eat anything you prefer, but it is better to start with liquids, then soup and crackers, and gradually work up to solid foods.   Please notify your doctor immediately if you have any unusual bleeding, trouble breathing, redness and pain at the surgery site, drainage, fever, or pain not relieved by medication.    Additional Instructions:      Need to make an appointment with your primary care doctor regarding need for potassium and magnesium supplements.  Until you get this appointment, you will need to take 1-2 doses of       these supplements (OTC) each day. .      Please contact your physician with any problems or Same Day Surgery at [phone], Monday through Friday 6 am to 4 pm, or [place] at [facility] number at [phone].

## 2021-06-14 NOTE — Anesthesia Procedure Notes (Signed)
Procedure Name: Intubation Date/Time: 06/14/2021 9:42 AM Performed by: Bea Graff, RN Pre-anesthesia Checklist: Patient identified, Emergency Drugs available, Suction available and Patient being monitored Patient Re-evaluated:Patient Re-evaluated prior to induction Oxygen Delivery Method: Circle system utilized Preoxygenation: Pre-oxygenation with 100% oxygen Induction Type: IV induction Ventilation: Mask ventilation without difficulty Laryngoscope Size: McGraph and 3 Grade View: Grade I Tube type: Oral Tube size: 7.0 mm Number of attempts: 1 Airway Equipment and Method: Stylet and Oral airway Placement Confirmation: ETT inserted through vocal cords under direct vision, positive ETCO2 and breath sounds checked- equal and bilateral Secured at: 21 cm Tube secured with: Tape Dental Injury: Teeth and Oropharynx as per pre-operative assessment

## 2021-06-15 ENCOUNTER — Encounter: Payer: Self-pay | Admitting: Otolaryngology

## 2021-06-15 LAB — SURGICAL PATHOLOGY

## 2021-07-05 ENCOUNTER — Other Ambulatory Visit: Payer: Self-pay

## 2021-07-05 ENCOUNTER — Ambulatory Visit (INDEPENDENT_AMBULATORY_CARE_PROVIDER_SITE_OTHER): Payer: Medicare Other | Admitting: Obstetrics & Gynecology

## 2021-07-05 ENCOUNTER — Encounter: Payer: Self-pay | Admitting: Obstetrics & Gynecology

## 2021-07-05 ENCOUNTER — Other Ambulatory Visit (HOSPITAL_COMMUNITY)
Admission: RE | Admit: 2021-07-05 | Discharge: 2021-07-05 | Disposition: A | Discharge status: Home / Self Care | Payer: Medicare Other | Source: Ambulatory Visit | Attending: Obstetrics & Gynecology | Admitting: Obstetrics & Gynecology

## 2021-07-05 VITALS — BP 140/90 | Ht 63.0 in | Wt 293.0 lb

## 2021-07-05 DIAGNOSIS — R9389 Abnormal findings on diagnostic imaging of other specified body structures: Secondary | ICD-10-CM

## 2021-07-05 DIAGNOSIS — N950 Postmenopausal bleeding: Secondary | ICD-10-CM | POA: Diagnosis present

## 2021-07-05 NOTE — Patient Instructions (Signed)

## 2021-07-05 NOTE — Progress Notes (Signed)
°  Endometrial Biopsy After discussion with the patient regarding her abnormal uterine bleeding I recommended that she proceed with an endometrial biopsy for further diagnosis. The risks, benefits, alternatives, and indications for an endometrial biopsy were discussed with the patient in detail. She understood the risks including infection, bleeding, cervical laceration and uterine perforation.  Verbal consent was obtained.   PROCEDURE NOTE:  Pipelle endometrial biopsy was performed using aseptic technique with iodine preparation.  The uterus was sounded to a length of 7 cm.  Adequate sampling was obtained with minimal blood loss.  The patient tolerated the procedure well.  Disposition will be pending pathology.  Pt is counseled on PMB, its potential etiology, and concerns for cancer.  Will direct plan of management based on results, as well as pattern of continued bleeding.  Hysteroscopy D&C discussed as next option if neg EMB yet continued bleeding episodes.  Referral to Gyn Onc if concerning findings are discovered.  Annamarie Major, MD, [facility], [facility] 07/05/2021  11:47 AM

## 2021-07-06 LAB — SURGICAL PATHOLOGY

## 2021-07-11 ENCOUNTER — Telehealth: Payer: Self-pay

## 2021-07-11 NOTE — Telephone Encounter (Signed)
   Message to pt on mychart, you can reassure with these results  No cancer and thus reassuring findings. At this point, we will monitor to see if you have continued episodes of bleeding and how spaced out those are. Written by Nadara Mustard, MD on 07/09/2021 1:21 PM EST

## 2021-07-11 NOTE — Telephone Encounter (Signed)
Pt calling for results from 2/14.

## 2021-08-06 ENCOUNTER — Other Ambulatory Visit: Payer: Self-pay

## 2021-08-06 ENCOUNTER — Emergency Department
Admission: EM | Admit: 2021-08-06 | Discharge: 2021-08-06 | Disposition: A | Discharge status: Home / Self Care | Payer: Medicare Other | Attending: Emergency Medicine | Admitting: Emergency Medicine

## 2021-08-06 ENCOUNTER — Emergency Department: Payer: Medicare Other

## 2021-08-06 DIAGNOSIS — J40 Bronchitis, not specified as acute or chronic: Secondary | ICD-10-CM | POA: Diagnosis not present

## 2021-08-06 DIAGNOSIS — R059 Cough, unspecified: Secondary | ICD-10-CM | POA: Diagnosis present

## 2021-08-06 DIAGNOSIS — R051 Acute cough: Secondary | ICD-10-CM

## 2021-08-06 DIAGNOSIS — I10 Essential (primary) hypertension: Secondary | ICD-10-CM | POA: Diagnosis not present

## 2021-08-06 LAB — BASIC METABOLIC PANEL
Anion gap: 9 (ref 5–15)
BUN: 14 mg/dL (ref 6–20)
CO2: 26 mmol/L (ref 22–32)
Calcium: 9 mg/dL (ref 8.9–10.3)
Chloride: 103 mmol/L (ref 98–111)
Creatinine, Ser: 1.13 mg/dL — ABNORMAL HIGH (ref 0.44–1.00)
GFR, Estimated: 56 mL/min — ABNORMAL LOW (ref >60)
Glucose, Bld: 111 mg/dL — ABNORMAL HIGH (ref 70–99)
Potassium: 3.7 mmol/L (ref 3.5–5.1)
Sodium: 138 mmol/L (ref 135–145)

## 2021-08-06 LAB — CBC
HCT: 40.9 % (ref 36.0–46.0)
Hemoglobin: 13.1 g/dL (ref 12.0–15.0)
MCH: 28.3 pg (ref 26.0–34.0)
MCHC: 32 g/dL (ref 30.0–36.0)
MCV: 88.3 fL (ref 80.0–100.0)
Platelets: 139 10*3/uL — ABNORMAL LOW (ref 150–400)
RBC: 4.63 MIL/uL (ref 3.87–5.11)
RDW: 14.9 % (ref 11.5–15.5)
WBC: 3.6 10*3/uL — ABNORMAL LOW (ref 4.0–10.5)
nRBC: 0 % (ref 0.0–0.2)

## 2021-08-06 LAB — TROPONIN I (HIGH SENSITIVITY): Troponin I (High Sensitivity): 9 ng/L (ref <18)

## 2021-08-06 MED ORDER — ALBUTEROL SULFATE HFA 108 (90 BASE) MCG/ACT IN AERS: 2.0000 | INHALATION_SPRAY | Freq: Four times a day (QID) | RESPIRATORY_TRACT | 2 refills | Status: DC | PRN

## 2021-08-06 MED ORDER — IPRATROPIUM-ALBUTEROL 0.5-2.5 (3) MG/3ML IN SOLN
3.0000 mL | Freq: Once | RESPIRATORY_TRACT | Status: AC
  Administered 2021-08-06: 3 mL via RESPIRATORY_TRACT
  Filled 2021-08-06: qty 3

## 2021-08-06 MED ORDER — LOSARTAN POTASSIUM 50 MG PO TABS: 25.0000 mg | ORAL_TABLET | Freq: Every day | ORAL | 0 refills | Status: DC

## 2021-08-06 NOTE — ED Notes (Signed)
Pt states doesn't take hydrochlorothiazide anymore and ran out of refills of lisinopril in last few days. Provider KRM notified pt needs refill sent to pharm. Pt agrees to take dose once picked up from pharm today.  ?

## 2021-08-06 NOTE — ED Notes (Signed)
Pt reports "L" med might be losartan not lisinopril.  ?

## 2021-08-06 NOTE — Discharge Instructions (Addendum)
Your chest x-ray did not show pneumonia your blood work was overall reassuring.  I suspect that you have bronchitis from a virus.  You can use the albuterol inhaler as needed for wheezing.  I would try over-the-counter Robitussin for cough.  If your symptoms of shortness of breath or chest pain are worsening, please return to the emergency department. ?

## 2021-08-06 NOTE — ED Notes (Signed)
IV in hand removed as not noted on assessment today.  ?

## 2021-08-06 NOTE — ED Provider Notes (Signed)
? ?[facility] ?Provider Note ? ? ? Event Date/Time  ? First MD Initiated Contact with Patient 08/06/21 1133   ?  (approximate) ? ? ?History  ? ?Cough and Chest Pain ? ? ?HPI ? ?Leslie Bautista is a 61 y.o. female with past medical history of OSA, depression hypertension and obesity who presents with cough.  Symptoms started about 5 days ago.  She endorses frequent cough that is minimally productive occasional clear sputum no hemoptysis.  Has associated pain in her chest chest with coughing no exertional pain.  Denies fevers or chills.  Does have mild dyspnea but again she thinks this is related to the cough.  She denies history of COPD or asthma but does feel like she has been wheezing over the last day or so.  Has been prescribed albuterol inhaler before does not have this at home.  She denies lower extremity edema history of DVT PE recent surgery immobilization or history of cancer. ?  ? ?Past Medical History:  ?Diagnosis Date  ? Cataract   ? Depression   ? Hypertension   ? Morbid obesity with BMI of 50.0-59.9, adult (HCC)   ? Obstructive sleep apnea   ? Osteoarthritis of both knees   ? Pre-diabetes   ? ? ?Patient Active Problem List  ? Diagnosis Date Noted  ? Endometrial thickening on ultrasound 07/05/2021  ? Dyspareunia due to medical condition in female 10/24/2019  ? Postmenopause bleeding 03/08/2018  ? Pre-diabetes 11/22/2015  ? Depression 06/03/2014  ? Class 3 severe obesity with serious comorbidity and body mass index (BMI) of 50.0 to 59.9 in adult [facility]) 01/07/2014  ? Hypertension, benign 10/25/2011  ? Primary localized osteoarthrosis, lower leg 12/19/2007  ? ? ? ?Physical Exam  ?Triage Vital Signs: ?ED Triage Vitals  ?Enc Vitals Group  ?   BP 08/06/21 1123 (!) 192/82  ?   Pulse Rate 08/06/21 1123 72  ?   Resp 08/06/21 1123 16  ?   Temp 08/06/21 1123 98.6 ?F (37 ?C)  ?   Temp Source 08/06/21 1123 Oral  ?   SpO2 08/06/21 1123 93 %  ?   Weight 08/06/21 1127 280 lb (127 kg)  ?   Height  08/06/21 1127 5\' 3"  (1.6 m)  ?   Head Circumference --   ?   Peak Flow --   ?   Pain Score 08/06/21 1126 5  ?   Pain Loc --   ?   Pain Edu? --   ?   Excl. in GC? --   ? ? ?Most recent vital signs: ?Vitals:  ? 08/06/21 1123  ?BP: (!) 192/82  ?Pulse: 72  ?Resp: 16  ?Temp: 98.6 ?F (37 ?C)  ?SpO2: 93%  ? ? ? ?General: Awake, no distress.  ?CV:  Good peripheral perfusion. No edema ?Resp:  Normal effort.  Frequent cough, scattered expiratory wheeze ?Abd:  No distention.  ?Neuro:             Awake, Alert, Oriented x 3  ?Other:   ? ? ?ED Results / Procedures / Treatments  ?Labs ?(all labs ordered are listed, but only abnormal results are displayed) ?Labs Reviewed  ?BASIC METABOLIC PANEL - Abnormal; Notable for the following components:  ?    Result Value  ? Glucose, Bld 111 (*)   ? Creatinine, Ser 1.13 (*)   ? GFR, Estimated 56 (*)   ? All other components within normal limits  ?CBC - Abnormal; Notable for the following  components:  ? WBC 3.6 (*)   ? Platelets 139 (*)   ? All other components within normal limits  ?TROPONIN I (HIGH SENSITIVITY)  ? ? ? ?EKG ? ?EKG reviewed by myself normal sinus rhythm with PVCs, normal axis normal intervals no acute ischemic changes ? ? ?RADIOLOGY ?I reviewed the CXR which does not show any acute cardiopulmonary process; agree with radiology report  ? ? ? ?PROCEDURES: ? ?Critical Care performed: No ? ?Procedures ? ? ? ?MEDICATIONS ORDERED IN ED: ?Medications  ?ipratropium-albuterol (DUONEB) 0.5-2.5 (3) MG/3ML nebulizer solution 3 mL (3 mLs Nebulization Given 08/06/21 1213)  ? ? ? ?IMPRESSION / MDM / ASSESSMENT AND PLAN / ED COURSE  ?I reviewed the triage vital signs and the nursing notes. ?             ?               ? ?Differential diagnosis includes, but is not limited to, viral bronchitis, pneumonia, less likely ACS PE or CHF ? ?The patient is a 61 year old female who presents with primarily cough for the last 5 days it is minimally productive of clear sputum without fevers.  She does  now have some associated chest pain just with coughing no exertional symptoms or pain absence of coughing and minimal shortness of breath.  On exam she does have a frequent dry cough and has some scattered wheezing she has no history of asthma or COPD and is not a smoker.  Has been prescribed albuterol in the past though.  Reviewed her chest x-ray which does not show an obvious infiltrate.  Labs are notable for mild leukopenia which may suggest a viral infection.  Troponin is negative x1 and her pain is really not suggestive of ischemia, AG with PVCs but no obvious ischemic changes.  BMP overall reassuring as well with no electrolyte abnormalities, creatinine at baseline.  Given the wheezing we will treat her with a DuoNeb.  No indication for antibiotics given no infiltrate and suspect this is viral bronchitis.  Did consider pulmonary embolism but given she is not tachycardic or hypoxic and has no risk factors I think this is less likely, especially given her chest pain and shortness of breath or really only in the setting of the cough.  Recommended trial of Robitussin over-the-counter and honey for cough. ? ?  ? ? ?FINAL CLINICAL IMPRESSION(S) / ED DIAGNOSES  ? ?Final diagnoses:  ?Acute cough  ?Bronchitis  ? ? ? ?Rx / DC Orders  ? ?ED Discharge Orders   ? ?      Ordered  ?  albuterol (VENTOLIN HFA) 108 (90 Base) MCG/ACT inhaler  Every 6 hours PRN       ? 08/06/21 1250  ? ?  ?  ? ?  ? ? ? ?Note:  This document was prepared using Dragon voice recognition software and may include unintentional dictation errors. ?  ?Georga Hacking, MD ?08/06/21 1251 ? ?

## 2021-08-06 NOTE — ED Triage Notes (Signed)
Patient to ER via POV with complaints of productive cough, shortness of breath, and chest pain x1 week. Reports chest pain is worse when coughing, centralized, non-radiating.No fevers at home. Reports bad headache. Unknown sick contacts.  ?

## 2021-09-19 ENCOUNTER — Ambulatory Visit (INDEPENDENT_AMBULATORY_CARE_PROVIDER_SITE_OTHER): Payer: Medicare Other | Admitting: Podiatry

## 2021-09-19 ENCOUNTER — Ambulatory Visit (INDEPENDENT_AMBULATORY_CARE_PROVIDER_SITE_OTHER): Payer: Medicare Other

## 2021-09-19 DIAGNOSIS — S93401A Sprain of unspecified ligament of right ankle, initial encounter: Secondary | ICD-10-CM

## 2021-09-19 DIAGNOSIS — M7750 Other enthesopathy of unspecified foot: Secondary | ICD-10-CM

## 2021-09-19 DIAGNOSIS — M7751 Other enthesopathy of right foot: Secondary | ICD-10-CM

## 2021-09-19 MED ORDER — MELOXICAM 15 MG PO TABS: 15.0000 mg | ORAL_TABLET | Freq: Every day | ORAL | 3 refills | Status: DC

## 2021-09-19 NOTE — Patient Instructions (Signed)
Ankle Sprain ? ? ?An ankle sprain is a stretch or tear in one of the tough tissues (ligaments) that connect the bones in your ankle. An ankle sprain can happen when the ankle rolls outward (inversion sprain) or inward (eversion sprain). ?What are the causes? ?This condition is caused by rolling or twisting the ankle. ?What increases the risk? ?You are more likely to develop this condition if you play sports. ?What are the signs or symptoms? ?Symptoms of this condition include: ?Pain in your ankle. ?Swelling. ?Bruising. This may happen right after you sprain your ankle or 1-2 days later. ?Trouble standing or walking. ?How is this diagnosed? ?This condition is diagnosed with: ?A physical exam. During the exam, your doctor will press on certain parts of your foot and ankle and try to move them in certain ways. ?X-ray imaging. These may be taken to see how bad the sprain is and to check for broken bones. ?How is this treated? ?This condition may be treated with: ?A brace or splint. This is used to keep the ankle from moving until it heals. ?An elastic bandage. This is used to support the ankle. ?Crutches. ?Pain medicine. ?Surgery. This may be needed if the sprain is very bad. ?Physical therapy. This may help to improve movement in the ankle. ?Follow these instructions at home: ?If you have a brace or a splint: ?Wear the brace or splint as told by your doctor. Remove it only as told by your doctor. ?Loosen the brace or splint if your toes: ?Tingle. ?Lose feeling (become numb). ?Turn cold and blue. ?Keep the brace or splint clean. ?If the brace or splint is not waterproof: ?Do not let it get wet. ?Cover it with a watertight covering when you take a bath or a shower. ?If you have an elastic bandage (dressing): ?Remove it to shower or bathe. ?Try not to move your ankle much, but wiggle your toes from time to time. This helps to prevent swelling. ?Adjust the dressing if it feels too tight. ?Loosen the dressing if your  foot: ?Loses feeling. ?Tingles. ?Becomes cold and blue. ?Managing pain, stiffness, and swelling ? ? ?Take over-the-counter and prescription medicines only as told by doctor. ?For 2-3 days, keep your ankle raised (elevated) above the level of your heart. ?If told, put ice on the injured area: ?If you have a removable brace or splint, remove it as told by your doctor. ?Put ice in a plastic bag. ?Place a towel between your skin and the bag. ?Leave the ice on for 20 minutes, 2-3 times a day. ?General instructions ?Rest your ankle. ?Do not use your injured leg to support your body weight until your doctor says that you can. Use crutches as told by your doctor. ?Do not use any products that contain nicotine or tobacco, such as cigarettes, e-cigarettes, and chewing tobacco. If you need help quitting, ask your doctor. ?Keep all follow-up visits as told by your doctor. ?Contact a doctor if: ?Your bruises or swelling are quickly getting worse. ?Your pain does not get better after you take medicine. ?Get help right away if: ?You cannot feel your toes or foot. ?Your foot or toes look blue. ?You have very bad pain that gets worse. ?Summary ?An ankle sprain is a stretch or tear in one of the tough tissues (ligaments) that connect the bones in your ankle. ?This condition is caused by rolling or twisting the ankle. ?Symptoms include pain, swelling, bruising, and trouble walking. ?To help with pain and swelling, put ice on  the injured ankle, raise your ankle above the level of your heart, and use an elastic bandage. Also, rest as told by your doctor. ?Keep all follow-up visits as told by your doctor. This is important. ?This information is not intended to replace advice given to you by your health care provider. Make sure you discuss any questions you have with your health care provider. ?Document Revised: 10/02/2017 Document Reviewed: 10/02/2017 ?Elsevier Patient Education ? 2020 [employer] ? ?Ankle Sprain, Phase I Rehab ?An  ankle sprain is an injury to the ligaments of your ankle. Ankle sprains cause stiffness, loss of motion, and loss of strength. Ask your health care provider which exercises are safe for you. Do exercises exactly as told by your health care provider and adjust them as directed. It is normal to feel mild stretching, pulling, tightness, or discomfort as you do these exercises. Stop right away if you feel sudden pain or your pain gets worse. Do not begin these exercises until told by your health care provider. ?Stretching and range-of-motion exercises ?These exercises warm up your muscles and joints and improve the movement and flexibility of your lower leg and ankle. These exercises also help to relieve pain and stiffness. ?Gastroc and soleus stretch ? ?This exercise is also called a calf stretch. It stretches the muscles in the back of the lower leg. These muscles are the gastrocnemius, or gastroc, and the soleus. ?Sit on the floor with your left / right leg extended. ?Loop a belt or towel around the ball of your left / right foot. The ball of your foot is on the walking surface, right under your toes. ?Keep your left / right ankle and foot relaxed and keep your knee straight while you use the belt or towel to pull your foot toward you. You should feel a gentle stretch behind your calf or knee in your gastroc muscle. ?Hold this position for 10 seconds, then release to the starting position. ?Repeat the exercise with your knee bent. You can put a pillow or a rolled bath towel under your knee to support it. You should feel a stretch deep in your calf in the soleus muscle or at your Achilles tendon. ?Repeat 10 times. Complete this exercise 2 times a day. ?Ankle alphabet ? ? ?Sit with your left / right leg supported at the lower leg. ?Do not rest your foot on anything. ?Make sure your foot has room to move freely. ?Think of your left / right foot as a paintbrush. ?Move your foot to trace each letter of the alphabet in the  air. Keep your hip and knee still while you trace. ?Make the letters as large as you can without feeling discomfort. ?Trace every letter from A to Z. ?Repeat 10 times. Complete this exercise 2 times a day. ?Strengthening exercises ?These exercises build strength and endurance in your ankle and lower leg. Endurance is the ability to use your muscles for a long time, even after they get tired. ?Ankle dorsiflexion ? ? ?Secure a rubber exercise band or tube to an object, such as a table leg, that will stay still when the band is pulled. Secure the other end around your left / right foot. ?Sit on the floor facing the object, with your left / right leg extended. The band or tube should be slightly tense when your foot is relaxed. ?Slowly bring your foot toward you, bringing the top of your foot toward your shin (dorsiflexion), and pulling the band tighter. ?Hold this position for  10 seconds. ?Slowly return your foot to the starting position. ?Repeat 10 times. Complete this exercise 2 times a day. ?Ankle plantar flexion ? ? ?Sit on the floor with your left / right leg extended. ?Loop a rubber exercise tube or band around the ball of your left / right foot. The ball of your foot is on the walking surface, right under your toes. ?Hold the ends of the band or tube in your hands. ?The band or tube should be slightly tense when your foot is relaxed. ?Slowly point your foot and toes downward to tilt the top of your foot away from your shin (plantar flexion). ?Hold this position for 10 seconds. ?Slowly return your foot to the starting position. ?Repeat 10 times. Complete this exercise 2 times a day. ?Ankle eversion ?Sit on the floor with your legs straight out in front of you. ?Loop a rubber exercise band or tube around the ball of your left / right foot. The ball of your foot is on the walking surface, right under your toes. ?Hold the ends of the band in your hands, or secure the band to a stable object. ?The band or tube should  be slightly tense when your foot is relaxed. ?Slowly push your foot outward, away from your other leg (eversion). ?Hold this position for 10 seconds. ?Slowly return your foot to the starting position. ?Repea

## 2021-09-20 NOTE — Progress Notes (Signed)
?  Subjective:  ?Patient ID: Leslie Bautista, female    DOB: 02-02-1961,  MRN: [id number] ? ?Chief Complaint  ?Patient presents with  ? Foot Pain  ?  Hurts to walk on R foot. Pt said foot is swollen.  ? ? ?61 y.o. female presents with the above complaint. History confirmed with patient.  Saturday evening she took a bad step and felt like her foot rolled she has pain in the lateral edge of the foot and ankle ? ?Objective:  ?Physical Exam: ?warm, good capillary refill, no trophic changes or ulcerative lesions, normal DP and PT pulses, and normal sensory exam. ?Left Foot: normal exam, no swelling, tenderness, instability; ligaments intact, full range of motion of all ankle/foot joints ?Right Foot:  Pain over the ATFL and CFL no ecchymosis or edema, there is no gross instability noted ? ?No images are attached to the encounter. ? ?Radiographs: ?Multiple views x-ray of left ankle and foot taken: no fracture, dislocation, swelling or degenerative changes noted ?Assessment:  ? ?1. Severe sprain of right ankle, initial encounter   ? ? ? ?Plan:  ?Patient was evaluated and treated and all questions answered. ? ?Discussed with her she has a severe sprain of the right ankle.  I recommended support in a Tri-Lock ankle brace was dispensed today.  Also gave her rehabilitation exercises to begin working on.  Discussed RICE protocol and icing and elevating.  Hopefully should resolve the next month advised to return to see me if it does not improve.  Rx for meloxicam sent she can take daily as needed. ? ?Return if symptoms worsen or fail to improve.  ? ?

## 2021-11-28 ENCOUNTER — Ambulatory Visit (INDEPENDENT_AMBULATORY_CARE_PROVIDER_SITE_OTHER): Payer: Medicare Other | Admitting: Podiatry

## 2021-11-28 ENCOUNTER — Ambulatory Visit (INDEPENDENT_AMBULATORY_CARE_PROVIDER_SITE_OTHER): Payer: Medicare Other

## 2021-11-28 ENCOUNTER — Other Ambulatory Visit: Payer: Self-pay | Admitting: Podiatry

## 2021-11-28 DIAGNOSIS — M7661 Achilles tendinitis, right leg: Secondary | ICD-10-CM

## 2021-11-28 DIAGNOSIS — S93401S Sprain of unspecified ligament of right ankle, sequela: Secondary | ICD-10-CM

## 2021-11-28 DIAGNOSIS — M7989 Other specified soft tissue disorders: Secondary | ICD-10-CM

## 2021-11-28 DIAGNOSIS — M67472 Ganglion, left ankle and foot: Secondary | ICD-10-CM

## 2021-11-28 NOTE — Patient Instructions (Signed)
Call [facility] to schedule your MRI at the below locations.  Please allow at least 1 business day after your visit to process the referral.  It may take longer depending on approval from insurance.  Please let me know if you have issues or problems scheduling the MRI   [facility] [phone] [address] [place],  [address]    

## 2021-11-28 NOTE — Progress Notes (Signed)
  Subjective:  Patient ID: Leslie Bautista, female    DOB: 1961/04/13,  MRN: [id number]  Chief Complaint  Patient presents with   Tendonitis    Follow up   R ankle pain    61 y.o. female presents with the above complaint. History confirmed with patient.  Pain is not much better.  She wore the brace for some time but did not help.  She has a new issue of a painful lump on the left foot Objective:  Physical Exam: warm, good capillary refill, no trophic changes or ulcerative lesions, normal DP and PT pulses, and normal sensory exam. Left Foot: Pain over dorsal midfoot around the [place] joint with palpable ganglion cyst and spur Right Foot:  Pain over the ATFL and CFL no ecchymosis or edema, there is no gross instability noted  No images are attached to the encounter.  Radiographs: Multiple views x-ray of left foot taken today show multiple degenerative changes, dorsal spurring midfoot over [place] joint Assessment:   1. Ganglion cyst of left foot   2. Severe sprain of right ankle, sequela      Plan:  Patient was evaluated and treated and all questions answered.  Reviewed the results of today's foot x-rays.  We discussed the presence of likely ganglion cyst over the bony spur.  We discussed drainage, injection and possible excision.  I recommend an MRI to evaluate the size and location for surgical planning  For her right foot ankle sprain she still has pain over the ATFL and CFL and peroneal tendons.  I recommend an MRI of the ankle to evaluate the presence of any tearing of ligaments or tendons.  She has had no improvement with Tri-Lock ankle brace immobilization in the last 2 months.  I recommend immobilization in a short cam boot which was dispensed today.  I will see her back after the MRI  No follow-ups on file.

## 2021-11-29 ENCOUNTER — Other Ambulatory Visit: Payer: Self-pay

## 2021-11-29 ENCOUNTER — Emergency Department
Admission: EM | Admit: 2021-11-29 | Discharge: 2021-11-29 | Disposition: A | Discharge status: Home / Self Care | Payer: Medicare Other | Attending: Emergency Medicine | Admitting: Emergency Medicine

## 2021-11-29 DIAGNOSIS — M948X9 Other specified disorders of cartilage, unspecified sites: Secondary | ICD-10-CM

## 2021-11-29 DIAGNOSIS — H60391 Other infective otitis externa, right ear: Secondary | ICD-10-CM

## 2021-11-29 DIAGNOSIS — H9201 Otalgia, right ear: Secondary | ICD-10-CM | POA: Insufficient documentation

## 2021-11-29 MED ORDER — CIPROFLOXACIN-DEXAMETHASONE 0.3-0.1 % OT SUSP
4.0000 [drp] | Freq: Two times a day (BID) | OTIC | Status: DC
  Administered 2021-11-29: 4 [drp] via OTIC
  Filled 2021-11-29: qty 7.5

## 2021-11-29 MED ORDER — CIPROFLOXACIN HCL 500 MG PO TABS: 500.0000 mg | ORAL_TABLET | Freq: Two times a day (BID) | ORAL | 0 refills | Status: AC

## 2021-11-29 MED ORDER — CIPROFLOXACIN HCL 500 MG PO TABS
500.0000 mg | ORAL_TABLET | Freq: Once | ORAL | Status: AC
  Administered 2021-11-29: 500 mg via ORAL
  Filled 2021-11-29: qty 1

## 2021-11-29 NOTE — ED Notes (Signed)
Pt declined to have discharge vitals taken at this time 

## 2021-11-29 NOTE — ED Notes (Signed)
See triage note.  Presents with some dizziness and some facial swelling.  Describes dizziness as "head spinning but not the room"

## 2021-11-29 NOTE — ED Triage Notes (Signed)
Patient has swelling to R side of face and neck area that is painful to the touch. First noticed on Sunday. Airway patent.

## 2021-11-29 NOTE — ED Provider Triage Note (Signed)
Emergency Medicine Provider Triage Evaluation Note  Leslie Bautista , a 61 y.o. female  was evaluated in triage.  Pt complains of swelling of the right ear and pain that started on Saturday.  No prior history of perichondritis.  Patient denies history of diabetes or frequent episodes of otitis externa in the past.  No discharge from the right ear.  Review of Systems  Positive: Patient has ear pain.  Negative: No chest pain or abdominal pain.   Physical Exam  BP (!) 162/80 (BP Location: Right Arm)   Pulse 71   Temp 98.4 F (36.9 C) (Oral)   Resp 18   Ht 5\' 3"  (1.6 m)   Wt 129.3 kg   LMP 10/15/2019 Comment: Patient states there is no way she is pregnant.   SpO2 98%   BMI 50.49 kg/m  Gen:   Awake, no distress   Resp:  Normal effort  MSK:   Moves extremities without difficulty  Other:    Medical Decision Making  Medically screening exam initiated at 5:31 PM.  Appropriate orders placed.  Jennalynn Rivard was informed that the remainder of the evaluation will be completed by another provider, this initial triage assessment does not replace that evaluation, and the importance of remaining in the ED until their evaluation is complete.  Physical exam findings concerning for perichondritis.   [name] [place], [place] 11/29/21 1732

## 2021-11-29 NOTE — ED Provider Notes (Signed)
Leslie Bautista Provider Note  Patient Contact: 7:11 PM (approximate)   History   Facial Swelling   HPI  Leslie Bautista is a 61 y.o. female who presents the emergency department complaining of right ear pain, swelling in the right ear.  Patient has swelling of the auricle itself as well as pain over the tragus.  Patient awoke 2 days ago with symptoms.  She is concerned that she may have been bitten or stung by an insect over the weekend prior to the onset of symptoms.  No fevers or chills.  She does have some pain radiating through her right face region.  She does report some "lumps" in the cervical lymph node chain region.  No sore throat, difficulty breathing or swallowing.  No URI symptoms such as nasal congestion, sinus pressure.     Physical Exam   Triage Vital Signs: ED Triage Vitals  Enc Vitals Group     BP 11/29/21 1730 (!) 162/80     Pulse Rate 11/29/21 1730 71     Resp 11/29/21 1730 18     Temp 11/29/21 1730 98.4 F (36.9 C)     Temp Source 11/29/21 1730 Oral     SpO2 11/29/21 1730 98 %     Weight 11/29/21 1731 285 lb (129.3 kg)     Height 11/29/21 1731 5\' 3"  (1.6 m)     Head Circumference --      Peak Flow --      Pain Score 11/29/21 1736 10     Pain Loc --      Pain Edu? --      Excl. in GC? --     Most recent vital signs: Vitals:   11/29/21 1730  BP: (!) 162/80  Pulse: 71  Resp: 18  Temp: 98.4 F (36.9 C)  SpO2: 98%     General: Alert and in no acute distress. ENT:      Ears: Visualization of the right external area reveals gross swelling of the external structures.  There is tenderness, warmth to palpation.  There is no fluctuance concerning for drainable fluid collection over the ear itself.  She is tender over the tragus as well.  Using otoscope, patient does have erythema and edema of the EAC.  TM is nonbulging, noninjected with no mucoid air-fluid level.  Left ear is unremarkable.      Nose: No congestion/rhinnorhea.       Mouth/Throat: Mucous membranes are moist. Neck: No stridor. No cervical spine tenderness to palpation Hematological/Lymphatic/Immunilogical: Positive for posterior auricular lymphadenopathy as well as anterior cervical chain  lymphadenopathy. Cardiovascular:  Good peripheral perfusion Respiratory: Normal respiratory effort without tachypnea or retractions. Lungs CTAB.  Musculoskeletal: Full range of motion to all extremities.  Neurologic:  No gross focal neurologic deficits are appreciated.  Skin:   No rash noted Other:   ED Results / Procedures / Treatments   Labs (all labs ordered are listed, but only abnormal results are displayed) Labs Reviewed - No data to display   EKG     RADIOLOGY    No results found.  PROCEDURES:  Critical Care performed: No  Procedures   MEDICATIONS ORDERED IN ED: Medications - No data to display   IMPRESSION / MDM / ASSESSMENT AND PLAN / ED COURSE  I reviewed the triage vital signs and the nursing notes.  Differential diagnosis includes, but is not limited to, otitis externa, otitis media, perichondritis, cauliflower ear, mastoiditis  Patient's presentation is most consistent with acute presentation with potential threat to life or bodily function.   Patient's diagnosis is consistent with pharyngitis, otitis externa.  Patient presents to the ED with complaint of right ear pain and swelling.  Findings on physical exam are consistent with perichondritis.  There is no fluid collection requiring incision and drainage at this time.  Patient also has findings consistent with otitis externa.  Patient with Ciprodex drops to the right ear and be placed on Cipro orally for the perichondritis..  First dose of medications will be provided by the emergency department.  Follow-up with primary care or ENT as needed.  Patient is given ED precautions to return to the ED for any worsening or new symptoms.        FINAL  CLINICAL IMPRESSION(S) / ED DIAGNOSES   Final diagnoses:  None     Rx / DC Orders   ED Discharge Orders     None        Note:  This document was prepared using Dragon voice recognition software and may include unintentional dictation errors.   Leslie Bautista 11/29/21 2121    Gilles Chiquito, MD 11/29/21 [phone]

## 2021-12-04 ENCOUNTER — Emergency Department
Admission: EM | Admit: 2021-12-04 | Discharge: 2021-12-04 | Discharge status: Against Medical Advice | Payer: Medicare Other | Attending: Emergency Medicine | Admitting: Emergency Medicine

## 2021-12-04 ENCOUNTER — Emergency Department: Payer: Medicare Other

## 2021-12-04 ENCOUNTER — Other Ambulatory Visit: Payer: Self-pay

## 2021-12-04 DIAGNOSIS — H9201 Otalgia, right ear: Secondary | ICD-10-CM | POA: Diagnosis not present

## 2021-12-04 DIAGNOSIS — Z5321 Procedure and treatment not carried out due to patient leaving prior to being seen by health care provider: Secondary | ICD-10-CM | POA: Diagnosis not present

## 2021-12-04 DIAGNOSIS — R4781 Slurred speech: Secondary | ICD-10-CM | POA: Insufficient documentation

## 2021-12-04 DIAGNOSIS — R42 Dizziness and giddiness: Secondary | ICD-10-CM | POA: Insufficient documentation

## 2021-12-04 LAB — CBC
HCT: 38.8 % (ref 36.0–46.0)
Hemoglobin: 12.3 g/dL (ref 12.0–15.0)
MCH: 27.7 pg (ref 26.0–34.0)
MCHC: 31.7 g/dL (ref 30.0–36.0)
MCV: 87.4 fL (ref 80.0–100.0)
Platelets: 163 10*3/uL (ref 150–400)
RBC: 4.44 MIL/uL (ref 3.87–5.11)
RDW: 13.5 % (ref 11.5–15.5)
WBC: 6.3 10*3/uL (ref 4.0–10.5)
nRBC: 0 % (ref 0.0–0.2)

## 2021-12-04 LAB — DIFFERENTIAL
Abs Immature Granulocytes: 0.01 10*3/uL (ref 0.00–0.07)
Basophils Absolute: 0 10*3/uL (ref 0.0–0.1)
Basophils Relative: 1 %
Eosinophils Absolute: 0.1 10*3/uL (ref 0.0–0.5)
Eosinophils Relative: 2 %
Immature Granulocytes: 0 %
Lymphocytes Relative: 42 %
Lymphs Abs: 2.7 10*3/uL (ref 0.7–4.0)
Monocytes Absolute: 0.5 10*3/uL (ref 0.1–1.0)
Monocytes Relative: 7 %
Neutro Abs: 3.1 10*3/uL (ref 1.7–7.7)
Neutrophils Relative %: 48 %

## 2021-12-04 LAB — COMPREHENSIVE METABOLIC PANEL
ALT: 15 U/L (ref 0–44)
AST: 20 U/L (ref 15–41)
Albumin: 3.6 g/dL (ref 3.5–5.0)
Alkaline Phosphatase: 85 U/L (ref 38–126)
Anion gap: 8 (ref 5–15)
BUN: 16 mg/dL (ref 6–20)
CO2: 28 mmol/L (ref 22–32)
Calcium: 9.4 mg/dL (ref 8.9–10.3)
Chloride: 105 mmol/L (ref 98–111)
Creatinine, Ser: 1.14 mg/dL — ABNORMAL HIGH (ref 0.44–1.00)
GFR, Estimated: 55 mL/min — ABNORMAL LOW (ref >60)
Glucose, Bld: 98 mg/dL (ref 70–99)
Potassium: 3.9 mmol/L (ref 3.5–5.1)
Sodium: 141 mmol/L (ref 135–145)
Total Bilirubin: 0.6 mg/dL (ref 0.3–1.2)
Total Protein: 8 g/dL (ref 6.5–8.1)

## 2021-12-04 LAB — PROTIME-INR
INR: 1.2 (ref 0.8–1.2)
Prothrombin Time: 14.6 seconds (ref 11.4–15.2)

## 2021-12-04 LAB — APTT: aPTT: 27 seconds (ref 24–36)

## 2021-12-04 LAB — TROPONIN I (HIGH SENSITIVITY): Troponin I (High Sensitivity): 7 ng/L (ref <18)

## 2021-12-04 NOTE — ED Triage Notes (Addendum)
Pt states was recently seen for a possible infection to right face/ear. Pt states she now is experiencing dizziness, right sided ear pain and right eye watering. Pt appears in no acute distress. Pt denies fever. Pt states she also woke up this morning at 0945 feeling like her speech was slurred with some words, not all. Pt with clear speech noted in triage.

## 2021-12-07 ENCOUNTER — Other Ambulatory Visit: Payer: Medicare Other

## 2021-12-16 ENCOUNTER — Other Ambulatory Visit: Payer: Self-pay | Admitting: Otolaryngology

## 2021-12-16 DIAGNOSIS — R42 Dizziness and giddiness: Secondary | ICD-10-CM

## 2022-01-02 ENCOUNTER — Ambulatory Visit
Admission: RE | Admit: 2022-01-02 | Discharge: 2022-01-02 | Disposition: A | Discharge status: Home / Self Care | Payer: Medicare Other | Source: Ambulatory Visit | Attending: Podiatry | Admitting: Podiatry

## 2022-01-02 DIAGNOSIS — M67472 Ganglion, left ankle and foot: Secondary | ICD-10-CM

## 2022-01-02 DIAGNOSIS — S93401S Sprain of unspecified ligament of right ankle, sequela: Secondary | ICD-10-CM

## 2022-01-25 ENCOUNTER — Other Ambulatory Visit: Payer: Self-pay | Admitting: Otolaryngology

## 2022-01-25 DIAGNOSIS — R09A2 Foreign body sensation, throat: Secondary | ICD-10-CM

## 2022-01-25 DIAGNOSIS — R0989 Other specified symptoms and signs involving the circulatory and respiratory systems: Secondary | ICD-10-CM

## 2022-01-31 ENCOUNTER — Ambulatory Visit
Admission: RE | Admit: 2022-01-31 | Discharge: 2022-01-31 | Disposition: A | Discharge status: Home / Self Care | Payer: Medicare Other | Source: Ambulatory Visit | Attending: Otolaryngology | Admitting: Otolaryngology

## 2022-01-31 DIAGNOSIS — R0989 Other specified symptoms and signs involving the circulatory and respiratory systems: Secondary | ICD-10-CM

## 2022-02-02 ENCOUNTER — Other Ambulatory Visit: Payer: Self-pay | Admitting: Otolaryngology

## 2022-02-02 DIAGNOSIS — E041 Nontoxic single thyroid nodule: Secondary | ICD-10-CM

## 2022-02-07 ENCOUNTER — Ambulatory Visit
Admission: RE | Admit: 2022-02-07 | Discharge: 2022-02-07 | Disposition: A | Discharge status: Home / Self Care | Payer: Medicare Other | Source: Ambulatory Visit | Attending: Otolaryngology | Admitting: Otolaryngology

## 2022-02-07 DIAGNOSIS — E041 Nontoxic single thyroid nodule: Secondary | ICD-10-CM

## 2022-02-08 ENCOUNTER — Other Ambulatory Visit: Payer: Self-pay | Admitting: Otolaryngology

## 2022-02-08 ENCOUNTER — Ambulatory Visit (INDEPENDENT_AMBULATORY_CARE_PROVIDER_SITE_OTHER): Payer: Medicare Other | Admitting: Podiatry

## 2022-02-08 DIAGNOSIS — G629 Polyneuropathy, unspecified: Secondary | ICD-10-CM | POA: Diagnosis not present

## 2022-02-08 DIAGNOSIS — E041 Nontoxic single thyroid nodule: Secondary | ICD-10-CM

## 2022-02-08 NOTE — Progress Notes (Signed)
She presents today for follow-up of her MRI to her right foot and her left foot.  She states that her right foot is still really sore as she points to the sinus tarsi area.  States that the ankle is doing better.  She is concerned about the bumps on top of the foot which are consistent with pain to her.  She is also complaining of numbness and burning pain to the bilateral foot she states that is sort of on again off again does not relate any balance issues at this point.  Seems that is bilaterally symmetrical.  She also has radiating pains proximally up the legs.  Objective: Vital signs are stable she is alert and oriented x3.  Pulses are palpable bilateral neurologic sensorium is intact Deetjen reflexes are intact muscle strength is normal and symmetrical bilateral.  She has pain on palpation of the sinus tarsi with end range of motion of the subtalar joint of the right foot.  This is most consistent with subtalar joint capsulitis MRI findings of the right ankle did not demonstrate anything significant.  Contralateral foot does demonstrate some dorsal spurring with a ganglion present.  Otherwise no significant osseous abnormalities there either.  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  Plan: I offered her injection to the subtalar joint of the right foot she declined.  We are going to request a referral for neurology for evaluation of bilateral lower extremity burning tingling and numbness with radiating pain.

## 2022-03-17 ENCOUNTER — Other Ambulatory Visit: Payer: Self-pay | Admitting: Otolaryngology

## 2022-03-17 DIAGNOSIS — K219 Gastro-esophageal reflux disease without esophagitis: Secondary | ICD-10-CM

## 2022-03-17 DIAGNOSIS — R1310 Dysphagia, unspecified: Secondary | ICD-10-CM

## 2022-03-17 DIAGNOSIS — R09A2 Foreign body sensation, throat: Secondary | ICD-10-CM

## 2022-04-03 ENCOUNTER — Encounter: Payer: Self-pay | Admitting: Otolaryngology

## 2022-04-03 ENCOUNTER — Other Ambulatory Visit: Payer: Self-pay | Admitting: Otolaryngology

## 2022-04-03 DIAGNOSIS — R220 Localized swelling, mass and lump, head: Secondary | ICD-10-CM

## 2022-04-20 ENCOUNTER — Other Ambulatory Visit: Payer: Medicare Other

## 2022-04-21 ENCOUNTER — Ambulatory Visit: Payer: Medicare Other

## 2022-04-27 ENCOUNTER — Ambulatory Visit
Admission: RE | Admit: 2022-04-27 | Discharge: 2022-04-27 | Disposition: A | Discharge status: Home / Self Care | Payer: Medicare Other | Source: Ambulatory Visit | Attending: Otolaryngology | Admitting: Otolaryngology

## 2022-04-27 DIAGNOSIS — R220 Localized swelling, mass and lump, head: Secondary | ICD-10-CM

## 2022-04-27 MED ORDER — IOPAMIDOL (ISOVUE-300) INJECTION 61%
75.0000 mL | Freq: Once | INTRAVENOUS | Status: AC | PRN
  Administered 2022-04-27: 75 mL via INTRAVENOUS

## 2022-05-09 ENCOUNTER — Ambulatory Visit: Payer: Medicare Other | Attending: Otolaryngology

## 2022-06-21 NOTE — Progress Notes (Unsigned)
Referring Physician:  Carloyn Manner, MD [place],  [place] [id number]  Primary Physician:  Bing Ree, MD  History of Present Illness: 06/22/2022 [name] is here today with a chief complaint of feels like something is stuck in her throat.  She had her tonsils removed recently and has had this sensation since that time.  She reports that she is able to eat both solids and liquids without difficulty.    She has been evaluated by her head and neck surgeon who found substantial osteophyte formation in the cervical spine.  She was referred here for evaluation.     Past Surgery: Tonsils removed 2023 by Dr Pryor Ochoa  Leslie Bautista has no symptoms of cervical myelopathy.  The symptoms are causing a significant impact on the patient's life.   I have utilized the care everywhere function in epic to review the outside records available from external health systems.  Review of Systems:  A 10 point review of systems is negative, except for the pertinent positives and negatives detailed in the HPI.  Past Medical History: Past Medical History:  Diagnosis Date   Cataract    Depression    Hypertension    Morbid obesity with BMI of 50.0-59.9, adult ([facility])    Obstructive sleep apnea    Osteoarthritis of both knees    Pre-diabetes     Past Surgical History: Past Surgical History:  Procedure Laterality Date   ANKLE SURGERY Right    ligament repair   CATARACT EXTRACTION, BILATERAL Bilateral 2022   COLONOSCOPY  2015   TONSILLECTOMY Bilateral 06/14/2021   Procedure: TONSILLECTOMY;  Surgeon: Carloyn Manner, MD;  Location: [facility] ORS;  Service: ENT;  Laterality: Bilateral;   TOTAL KNEE ARTHROPLASTY Right    TUBAL LIGATION      Allergies: Allergies as of 06/22/2022 - Review Complete 06/22/2022  Allergen Reaction Noted   Oxycontin [oxycodone hcl] Rash 09/29/2014    Medications: Current Meds  Medication Sig   gabapentin  (NEURONTIN) 300 MG capsule Take 300mg   ( 1 tab) at night for 3 nights, then increase to 600 mg ( 2 tabs) at night for 3 nights, then increase to 900 mg ( 3 tabs) at night.    Social History: Social History   Tobacco Use   Smoking status: Never   Smokeless tobacco: Never  Vaping Use   Vaping Use: Never used  Substance Use Topics   Alcohol use: Yes    Comment: rarely   Drug use: No    Family Medical History: No family history on file.  Physical Examination: Vitals:   06/22/22 1006  BP: (!) 160/90    General: Patient is well developed, well nourished, calm, collected, and in no apparent distress. Attention to examination is appropriate.  Neck:   Supple.  Full range of motion.  Respiratory: Patient is breathing without any difficulty.   NEUROLOGICAL:     Awake, alert, oriented to person, place, and time.  Speech is clear and fluent.   Cranial Nerves: Pupils equal round and reactive to light.  Facial tone is symmetric.  Facial sensation is symmetric. Shoulder shrug is symmetric. Tongue protrusion is midline.  There is no pronator drift.  ROM of spine: full.    Strength: Side Biceps Triceps Deltoid Interossei Grip Wrist Ext. Wrist Flex.  R 5 5 5 5 5 5 5   L 5 5 5 5 5 5 5    Side Iliopsoas Quads Hamstring PF DF EHL  R  5 5 5 5 5 5   L 5 5 5 5 5 5    Reflexes are 1+ and symmetric at the biceps, triceps, brachioradialis, patella and achilles.   Hoffman's is absent.   Bilateral upper and lower extremity sensation is intact to light touch.    No evidence of dysmetria noted.  Gait is normal.     Medical Decision Making  Imaging: Ct Neck 04/27/22 IMPRESSION: 1. Two findings which could contribute to globus sensation and/or difficulty swallowing: - nonspecific bulky enlargement of the lingual tonsil, and less pronounced similar enlargement of the adenoids. Recommend correlation with direct visualization. - Diffuse idiopathic skeletal hyperostosis (DISH), with  bulky anterior cervical endplate osteophytosis at C1 and C4 through C6 effacing the cervical esophagus. No interbody ankylosis is evident despite the bulky osteophytosis.   2. Advanced cervical carotid atherosclerosis, with possible hemodynamically significant stenosis of the proximal right ICA. Carotid Doppler ultrasound may be valuable.     Electronically Signed   By: Genevie Ann M.D.   On: 04/28/2022 10:03  I have personally reviewed the images and agree with the above interpretation.  Assessment and Plan: Leslie Bautista is a pleasant 62 y.o. female with extensive osteophyte formation with what sounds like dysphagia.  We discussed that any decision on intervention would rely on a swallow study.  I will order a swallow study and we will discuss the results after they have been obtained.  If she has an abnormal swallow study, we will discuss removal of her anterior osteophytes from C4-C6.  I spent a total of 30 minutes in this patient's care today. This time was spent reviewing pertinent records including imaging studies, obtaining and confirming history, performing a directed evaluation, formulating and discussing my recommendations, and documenting the visit within the medical record.    Thank you for involving me in the care of this patient.      Leslie Covington K. Izora Ribas MD, [facility] Neurosurgery

## 2022-06-22 ENCOUNTER — Ambulatory Visit (INDEPENDENT_AMBULATORY_CARE_PROVIDER_SITE_OTHER): Payer: 59 | Admitting: Neurosurgery

## 2022-06-22 VITALS — BP 160/90 | Ht 63.0 in | Wt 290.6 lb

## 2022-06-22 DIAGNOSIS — M47812 Spondylosis without myelopathy or radiculopathy, cervical region: Secondary | ICD-10-CM | POA: Diagnosis not present

## 2022-06-22 DIAGNOSIS — R1319 Other dysphagia: Secondary | ICD-10-CM

## 2022-09-26 DIAGNOSIS — C50912 Malignant neoplasm of unspecified site of left female breast: Secondary | ICD-10-CM | POA: Insufficient documentation

## 2022-10-24 DIAGNOSIS — T7840XA Allergy, unspecified, initial encounter: Secondary | ICD-10-CM | POA: Insufficient documentation

## 2022-12-02 ENCOUNTER — Other Ambulatory Visit: Payer: Self-pay

## 2022-12-02 DIAGNOSIS — Z96651 Presence of right artificial knee joint: Secondary | ICD-10-CM | POA: Diagnosis present

## 2022-12-02 DIAGNOSIS — D701 Agranulocytosis secondary to cancer chemotherapy: Secondary | ICD-10-CM | POA: Diagnosis present

## 2022-12-02 DIAGNOSIS — E785 Hyperlipidemia, unspecified: Secondary | ICD-10-CM | POA: Diagnosis present

## 2022-12-02 DIAGNOSIS — I5A Non-ischemic myocardial injury (non-traumatic): Secondary | ICD-10-CM | POA: Diagnosis present

## 2022-12-02 DIAGNOSIS — Z5941 Food insecurity: Secondary | ICD-10-CM

## 2022-12-02 DIAGNOSIS — N390 Urinary tract infection, site not specified: Secondary | ICD-10-CM | POA: Diagnosis not present

## 2022-12-02 DIAGNOSIS — R1314 Dysphagia, pharyngoesophageal phase: Secondary | ICD-10-CM | POA: Diagnosis present

## 2022-12-02 DIAGNOSIS — I10 Essential (primary) hypertension: Secondary | ICD-10-CM | POA: Diagnosis present

## 2022-12-02 DIAGNOSIS — C50912 Malignant neoplasm of unspecified site of left female breast: Secondary | ICD-10-CM | POA: Diagnosis present

## 2022-12-02 DIAGNOSIS — G4733 Obstructive sleep apnea (adult) (pediatric): Secondary | ICD-10-CM | POA: Diagnosis present

## 2022-12-02 DIAGNOSIS — Z8249 Family history of ischemic heart disease and other diseases of the circulatory system: Secondary | ICD-10-CM

## 2022-12-02 DIAGNOSIS — R7303 Prediabetes: Secondary | ICD-10-CM | POA: Diagnosis present

## 2022-12-02 DIAGNOSIS — Z885 Allergy status to narcotic agent status: Secondary | ICD-10-CM

## 2022-12-02 DIAGNOSIS — I2510 Atherosclerotic heart disease of native coronary artery without angina pectoris: Secondary | ICD-10-CM | POA: Diagnosis present

## 2022-12-02 DIAGNOSIS — D6959 Other secondary thrombocytopenia: Secondary | ICD-10-CM | POA: Diagnosis present

## 2022-12-02 DIAGNOSIS — E876 Hypokalemia: Secondary | ICD-10-CM | POA: Diagnosis present

## 2022-12-02 DIAGNOSIS — Z6841 Body Mass Index (BMI) 40.0 and over, adult: Secondary | ICD-10-CM

## 2022-12-02 DIAGNOSIS — I700 Atherosclerosis of aorta: Secondary | ICD-10-CM | POA: Diagnosis present

## 2022-12-02 DIAGNOSIS — Z79899 Other long term (current) drug therapy: Secondary | ICD-10-CM

## 2022-12-02 DIAGNOSIS — Z791 Long term (current) use of non-steroidal anti-inflammatories (NSAID): Secondary | ICD-10-CM

## 2022-12-02 DIAGNOSIS — T451X5A Adverse effect of antineoplastic and immunosuppressive drugs, initial encounter: Secondary | ICD-10-CM | POA: Diagnosis present

## 2022-12-02 DIAGNOSIS — Z5986 Financial insecurity: Secondary | ICD-10-CM

## 2022-12-02 LAB — URINALYSIS, ROUTINE W REFLEX MICROSCOPIC
Bilirubin Urine: NEGATIVE
Glucose, UA: NEGATIVE mg/dL
Ketones, ur: NEGATIVE mg/dL
Nitrite: NEGATIVE
Protein, ur: 30 mg/dL — AB
RBC / HPF: >50 RBC/hpf (ref 0–5)
Specific Gravity, Urine: 1.011 (ref 1.005–1.030)
Squamous Epithelial / HPF: >50 /HPF (ref 0–5)
WBC, UA: >50 WBC/hpf (ref 0–5)
pH: 6 (ref 5.0–8.0)

## 2022-12-02 LAB — CBC WITH DIFFERENTIAL/PLATELET
Abs Immature Granulocytes: 0.31 10*3/uL — ABNORMAL HIGH (ref 0.00–0.07)
Basophils Absolute: 0 10*3/uL (ref 0.0–0.1)
Basophils Relative: 0 %
Eosinophils Absolute: 0.1 10*3/uL (ref 0.0–0.5)
Eosinophils Relative: 1 %
HCT: 36.9 % (ref 36.0–46.0)
Hemoglobin: 12.4 g/dL (ref 12.0–15.0)
Immature Granulocytes: 6 %
Lymphocytes Relative: 17 %
Lymphs Abs: 0.9 10*3/uL (ref 0.7–4.0)
MCH: 28.4 pg (ref 26.0–34.0)
MCHC: 33.6 g/dL (ref 30.0–36.0)
MCV: 84.6 fL (ref 80.0–100.0)
Monocytes Absolute: 0.1 10*3/uL (ref 0.1–1.0)
Monocytes Relative: 2 %
Neutro Abs: 3.7 10*3/uL (ref 1.7–7.7)
Neutrophils Relative %: 74 %
Platelets: 70 10*3/uL — ABNORMAL LOW (ref 150–400)
RBC: 4.36 MIL/uL (ref 3.87–5.11)
RDW: 13.1 % (ref 11.5–15.5)
Smear Review: NORMAL
WBC: 5.1 10*3/uL (ref 4.0–10.5)
nRBC: 0 % (ref 0.0–0.2)

## 2022-12-02 LAB — LIPASE, BLOOD: Lipase: 25 U/L (ref 11–51)

## 2022-12-02 LAB — COMPREHENSIVE METABOLIC PANEL
ALT: 29 U/L (ref 0–44)
AST: 30 U/L (ref 15–41)
Albumin: 3.9 g/dL (ref 3.5–5.0)
Alkaline Phosphatase: 97 U/L (ref 38–126)
Anion gap: 11 (ref 5–15)
BUN: 11 mg/dL (ref 8–23)
CO2: 24 mmol/L (ref 22–32)
Calcium: 8.9 mg/dL (ref 8.9–10.3)
Chloride: 101 mmol/L (ref 98–111)
Creatinine, Ser: 0.87 mg/dL (ref 0.44–1.00)
GFR, Estimated: >60 mL/min (ref >60)
Glucose, Bld: 170 mg/dL — ABNORMAL HIGH (ref 70–99)
Potassium: 3.2 mmol/L — ABNORMAL LOW (ref 3.5–5.1)
Sodium: 136 mmol/L (ref 135–145)
Total Bilirubin: 1.7 mg/dL — ABNORMAL HIGH (ref 0.3–1.2)
Total Protein: 7.6 g/dL (ref 6.5–8.1)

## 2022-12-02 LAB — TROPONIN I (HIGH SENSITIVITY): Troponin I (High Sensitivity): 20 ng/L — ABNORMAL HIGH (ref <18)

## 2022-12-02 NOTE — ED Triage Notes (Signed)
Breast CA patient (receives IV chemo) here tonight for nausea following her chemotherapy infusion on Tuesday; She states that this was her first infusion with a new drug and her prescribed Zofran is not helping

## 2022-12-03 ENCOUNTER — Encounter: Payer: Self-pay | Admitting: Internal Medicine

## 2022-12-03 ENCOUNTER — Inpatient Hospital Stay
Admission: EM | Admit: 2022-12-03 | Discharge: 2022-12-06 | DRG: 690 | Disposition: A | Discharge status: Home / Self Care | Payer: 59 | Attending: Student | Admitting: Student

## 2022-12-03 ENCOUNTER — Emergency Department: Payer: 59

## 2022-12-03 DIAGNOSIS — N3000 Acute cystitis without hematuria: Secondary | ICD-10-CM

## 2022-12-03 DIAGNOSIS — T451X5A Adverse effect of antineoplastic and immunosuppressive drugs, initial encounter: Secondary | ICD-10-CM

## 2022-12-03 DIAGNOSIS — I5A Non-ischemic myocardial injury (non-traumatic): Secondary | ICD-10-CM | POA: Diagnosis present

## 2022-12-03 DIAGNOSIS — I10 Essential (primary) hypertension: Secondary | ICD-10-CM | POA: Diagnosis present

## 2022-12-03 DIAGNOSIS — G4733 Obstructive sleep apnea (adult) (pediatric): Secondary | ICD-10-CM

## 2022-12-03 DIAGNOSIS — I152 Hypertension secondary to endocrine disorders: Secondary | ICD-10-CM | POA: Diagnosis present

## 2022-12-03 DIAGNOSIS — E1169 Type 2 diabetes mellitus with other specified complication: Secondary | ICD-10-CM | POA: Diagnosis present

## 2022-12-03 DIAGNOSIS — E66813 Obesity, class 3: Secondary | ICD-10-CM | POA: Diagnosis present

## 2022-12-03 DIAGNOSIS — C50919 Malignant neoplasm of unspecified site of unspecified female breast: Secondary | ICD-10-CM | POA: Diagnosis present

## 2022-12-03 DIAGNOSIS — E785 Hyperlipidemia, unspecified: Secondary | ICD-10-CM | POA: Diagnosis present

## 2022-12-03 DIAGNOSIS — D701 Agranulocytosis secondary to cancer chemotherapy: Secondary | ICD-10-CM

## 2022-12-03 DIAGNOSIS — D696 Thrombocytopenia, unspecified: Secondary | ICD-10-CM

## 2022-12-03 DIAGNOSIS — N390 Urinary tract infection, site not specified: Secondary | ICD-10-CM | POA: Diagnosis present

## 2022-12-03 DIAGNOSIS — R7989 Other specified abnormal findings of blood chemistry: Secondary | ICD-10-CM | POA: Diagnosis present

## 2022-12-03 DIAGNOSIS — R110 Nausea: Principal | ICD-10-CM

## 2022-12-03 LAB — URINE DRUG SCREEN, QUALITATIVE (ARMC ONLY)
Amphetamines, Ur Screen: NOT DETECTED
Barbiturates, Ur Screen: NOT DETECTED
Benzodiazepine, Ur Scrn: NOT DETECTED
Cannabinoid 50 Ng, Ur ~~LOC~~: NOT DETECTED
Cocaine Metabolite,Ur ~~LOC~~: NOT DETECTED
MDMA (Ecstasy)Ur Screen: NOT DETECTED
Methadone Scn, Ur: NOT DETECTED
Opiate, Ur Screen: NOT DETECTED
Phencyclidine (PCP) Ur S: NOT DETECTED
Tricyclic, Ur Screen: NOT DETECTED

## 2022-12-03 LAB — LIPID PANEL
Cholesterol: 125 mg/dL (ref 0–200)
HDL: 41 mg/dL (ref >40)
LDL Cholesterol: 66 mg/dL (ref 0–99)
Total CHOL/HDL Ratio: 3 RATIO
Triglycerides: 92 mg/dL (ref <150)
VLDL: 18 mg/dL (ref 0–40)

## 2022-12-03 LAB — GLUCOSE, CAPILLARY
Glucose-Capillary: 123 mg/dL — ABNORMAL HIGH (ref 70–99)
Glucose-Capillary: 125 mg/dL — ABNORMAL HIGH (ref 70–99)

## 2022-12-03 LAB — TECHNOLOGIST SMEAR REVIEW: Plt Morphology: DECREASED

## 2022-12-03 LAB — LACTATE DEHYDROGENASE: LDH: 202 U/L — ABNORMAL HIGH (ref 98–192)

## 2022-12-03 LAB — TROPONIN I (HIGH SENSITIVITY)
Troponin I (High Sensitivity): 34 ng/L — ABNORMAL HIGH (ref <18)
Troponin I (High Sensitivity): 32 ng/L — ABNORMAL HIGH (ref <18)

## 2022-12-03 LAB — PHOSPHORUS: Phosphorus: 2.8 mg/dL (ref 2.5–4.6)

## 2022-12-03 LAB — MAGNESIUM: Magnesium: 1.8 mg/dL (ref 1.7–2.4)

## 2022-12-03 MED ORDER — SODIUM CHLORIDE 0.9 % IV BOLUS
1000.0000 mL | Freq: Once | INTRAVENOUS | Status: AC
  Administered 2022-12-03: 1000 mL via INTRAVENOUS

## 2022-12-03 MED ORDER — ONDANSETRON HCL 4 MG/2ML IJ SOLN
4.0000 mg | Freq: Three times a day (TID) | INTRAMUSCULAR | Status: DC | PRN
  Administered 2022-12-03: 4 mg via INTRAVENOUS
  Filled 2022-12-03: qty 2

## 2022-12-03 MED ORDER — ALBUTEROL SULFATE HFA 108 (90 BASE) MCG/ACT IN AERS: 2.0000 | INHALATION_SPRAY | Freq: Four times a day (QID) | RESPIRATORY_TRACT | Status: DC | PRN

## 2022-12-03 MED ORDER — ASPIRIN 81 MG PO CHEW
324.0000 mg | CHEWABLE_TABLET | Freq: Once | ORAL | Status: AC
  Administered 2022-12-03: 324 mg via ORAL
  Filled 2022-12-03: qty 4

## 2022-12-03 MED ORDER — HYDROXYZINE HCL 50 MG PO TABS
25.0000 mg | ORAL_TABLET | Freq: Every day | ORAL | Status: DC
  Administered 2022-12-03 – 2022-12-05 (×3): 25 mg via ORAL
  Filled 2022-12-03 (×3): qty 1

## 2022-12-03 MED ORDER — SODIUM CHLORIDE 0.9 % IV SOLN
2.0000 g | INTRAVENOUS | Status: DC
  Administered 2022-12-04 – 2022-12-06 (×3): 2 g via INTRAVENOUS
  Filled 2022-12-03 (×3): qty 20

## 2022-12-03 MED ORDER — HYDRALAZINE HCL 20 MG/ML IJ SOLN: 5.0000 mg | INTRAMUSCULAR | Status: DC | PRN

## 2022-12-03 MED ORDER — SERTRALINE HCL 50 MG PO TABS
50.0000 mg | ORAL_TABLET | Freq: Every day | ORAL | Status: DC
  Administered 2022-12-03 – 2022-12-06 (×4): 50 mg via ORAL
  Filled 2022-12-03 (×4): qty 1

## 2022-12-03 MED ORDER — ASPIRIN 81 MG PO TBEC
81.0000 mg | DELAYED_RELEASE_TABLET | Freq: Every day | ORAL | Status: DC
  Administered 2022-12-04 – 2022-12-06 (×3): 81 mg via ORAL
  Filled 2022-12-03 (×3): qty 1

## 2022-12-03 MED ORDER — PHENAZOPYRIDINE HCL 100 MG PO TABS
100.0000 mg | ORAL_TABLET | Freq: Three times a day (TID) | ORAL | Status: DC
  Administered 2022-12-03 – 2022-12-06 (×8): 100 mg via ORAL
  Filled 2022-12-03 (×10): qty 1

## 2022-12-03 MED ORDER — ENSURE ENLIVE PO LIQD: 237.0000 mL | Freq: Two times a day (BID) | ORAL | Status: DC

## 2022-12-03 MED ORDER — GABAPENTIN 300 MG PO CAPS
900.0000 mg | ORAL_CAPSULE | Freq: Every day | ORAL | Status: DC
  Administered 2022-12-03 – 2022-12-04 (×2): 900 mg via ORAL
  Filled 2022-12-03 (×3): qty 3

## 2022-12-03 MED ORDER — ACETAMINOPHEN 325 MG PO TABS
650.0000 mg | ORAL_TABLET | Freq: Four times a day (QID) | ORAL | Status: DC | PRN
  Administered 2022-12-03 – 2022-12-04 (×2): 650 mg via ORAL
  Filled 2022-12-03 (×2): qty 2

## 2022-12-03 MED ORDER — SODIUM CHLORIDE 0.9 % IV SOLN
1.0000 g | Freq: Once | INTRAVENOUS | Status: AC
  Administered 2022-12-03: 1 g via INTRAVENOUS
  Filled 2022-12-03: qty 10

## 2022-12-03 MED ORDER — POTASSIUM CHLORIDE CRYS ER 20 MEQ PO TBCR
40.0000 meq | EXTENDED_RELEASE_TABLET | Freq: Once | ORAL | Status: AC
  Administered 2022-12-03: 40 meq via ORAL
  Filled 2022-12-03: qty 2

## 2022-12-03 MED ORDER — METOCLOPRAMIDE HCL 5 MG/ML IJ SOLN
10.0000 mg | Freq: Once | INTRAMUSCULAR | Status: AC
  Administered 2022-12-03: 10 mg via INTRAVENOUS
  Filled 2022-12-03: qty 2

## 2022-12-03 MED ORDER — LOSARTAN POTASSIUM 50 MG PO TABS
50.0000 mg | ORAL_TABLET | Freq: Every day | ORAL | Status: DC
  Administered 2022-12-03 – 2022-12-06 (×3): 50 mg via ORAL
  Filled 2022-12-03 (×3): qty 1

## 2022-12-03 MED ORDER — IOHEXOL 300 MG/ML  SOLN
100.0000 mL | Freq: Once | INTRAMUSCULAR | Status: AC | PRN
  Administered 2022-12-03: 100 mL via INTRAVENOUS

## 2022-12-03 MED ORDER — ATORVASTATIN CALCIUM 20 MG PO TABS
20.0000 mg | ORAL_TABLET | Freq: Every day | ORAL | Status: DC
  Administered 2022-12-03 – 2022-12-05 (×3): 20 mg via ORAL
  Filled 2022-12-03 (×3): qty 1

## 2022-12-03 MED ORDER — GABAPENTIN 300 MG PO CAPS: 900.0000 mg | ORAL_CAPSULE | Freq: Every day | ORAL | Status: DC

## 2022-12-03 MED ORDER — ALBUTEROL SULFATE (2.5 MG/3ML) 0.083% IN NEBU: 2.5000 mg | INHALATION_SOLUTION | Freq: Four times a day (QID) | RESPIRATORY_TRACT | Status: DC | PRN

## 2022-12-03 NOTE — Progress Notes (Signed)
Patient arrived to unit from ER in stable condition. Ambulates with steady gait.

## 2022-12-03 NOTE — H&P (Signed)
History and Physical    [name] WUJ:[id number] DOB: [birth year] DOA: 12/03/2022  Referring MD/NP/PA:   PCP: [facility]   Patient coming from:  The patient is coming from home.    Chief Complaint: Increased urinary frequency, nausea  HPI: Leslie Bautista is a 62 y.o. female with medical history significant of breast cancer (s/p of lumpectomy, on chemotherapy currently), hypertension, hyperlipidemia, prediabetes, obesity, OSA on CPAP, who presents with increased urinary frequency, nausea.  Patient states that she has increased urinary frequency in the past several days, no dysuria, burning on urination or hematuria.  She states that she is doing chemotherapy, last dose was on 7/9. She developed nausea, no vomiting or diarrhea.  Patient has central abdominal discomfort, which is intermittent, mild to moderate, nonradiating.  No fever or chills.  Patient does not have chest pain, cough, shortness of breath.  Data reviewed independently and ED Course: pt was found to have WBC 5.1, troponin level 28, 34, 32, thrombocytopenia with platelet 70, positive urinalysis (cloudy appearance, large amount of leukocyte, many bacteria, WBC> 50, squamous cell> 50), potassium 3.2, magnesium 1.8, phosphorus 2.8, GFR> 60.  Temperature 99.4, blood pressure 161/74, heart rate 106 --> 85, RR 20, oxygen saturation 100% on room air.  Patient is placed on TeleMed follow-up patient  CT abdomen/pelvis: 1. Cystitis versus bladder nondistention. 2. Cholelithiasis without evidence of acute cholecystitis. 3. Mild hepatic steatosis. 4. Uncomplicated sigmoid diverticulosis. 5. Aortic and coronary artery atherosclerosis. 6. 3.2 x 3.1 cm homogeneous fluid collection in the outer left breast, most likely either a seroma, breast cyst or liquified hematoma. Presumably the patient has outside breast imaging more recent than the most recent mammograms in our system from August 2015. Correlation with  outside studies recommended for possible significance of this.   Aortic Atherosclerosis (ICD10-I70.0).   EKG: I have personally reviewed.  Sinus rhythm, QTc 454, RAD, low voltage, early R wave questioning   Review of Systems:   General: no fevers, chills, no body weight gain, has fatigue HEENT: no blurry vision, hearing changes or sore throat Respiratory: no dyspnea, coughing, wheezing CV: no chest pain, no palpitations GI: has nausea, abdominal pain, no vomiting, diarrhea, constipation GU: no dysuria, burning on urination, has increased urinary frequency, no hematuria  Ext: no leg edema Neuro: no unilateral weakness, numbness, or tingling, no vision change or hearing loss Skin: no rash, no skin tear. MSK: No muscle spasm, no deformity, no limitation of range of movement in spin Heme: No easy bruising.  Travel history: No recent long distant travel.   Allergy:  Allergies  Allergen Reactions   Oxycontin [Oxycodone Hcl] Rash    Past Medical History:  Diagnosis Date   Cataract    Depression    Hypertension    Morbid obesity with BMI of 50.0-59.9, adult ([facility])    Obstructive sleep apnea    Osteoarthritis of both knees    Pre-diabetes     Past Surgical History:  Procedure Laterality Date   ANKLE SURGERY Right    ligament repair   CATARACT EXTRACTION, BILATERAL Bilateral 2022   COLONOSCOPY  2015   TONSILLECTOMY Bilateral 06/14/2021   Procedure: TONSILLECTOMY;  Surgeon: Bud Face, MD;  Location: [facility] ORS;  Service: ENT;  Laterality: Bilateral;   TOTAL KNEE ARTHROPLASTY Right    TUBAL LIGATION      Social History:  reports that she has never smoked. She has never used smokeless tobacco. She reports current alcohol use. She reports that she does not use  drugs.  Family History:  Family History  Problem Relation Age of Onset   Hypertension Sister    Hypertension Brother      Prior to Admission medications   Medication Sig Start Date End Date Taking?  Authorizing Provider  albuterol (VENTOLIN HFA) 108 (90 Base) MCG/ACT inhaler SMARTSIG:2 Puff(s) By Mouth Every 4 Hours PRN 11/06/19   [provider]  albuterol (VENTOLIN HFA) 108 (90 Base) MCG/ACT inhaler Inhale 2 puffs into the lungs every 6 (six) hours as needed for wheezing or shortness of breath. 08/06/21 09/05/21  Georga Hacking, MD  atorvastatin (LIPITOR) 20 MG tablet Take 20 mg by mouth at bedtime. 05/06/21   [provider]  gabapentin (NEURONTIN) 300 MG capsule Take 300mg   ( 1 tab) at night for 3 nights, then increase to 600 mg ( 2 tabs) at night for 3 nights, then increase to 900 mg ( 3 tabs) at night. 06/19/22   [provider]  losartan (COZAAR) 50 MG tablet Take 0.5 tablets (25 mg total) by mouth daily. 08/06/21 09/05/21  Georga Hacking, MD  meloxicam (MOBIC) 15 MG tablet Take 1 tablet (15 mg total) by mouth daily. 09/19/21   McDonald, Rachelle Hora, DPM  traZODone (DESYREL) 100 MG tablet Take 100 mg by mouth at bedtime. 08/25/15   [provider]    Physical Exam: Vitals:   12/03/22 1322 12/03/22 1337 12/03/22 1536 12/03/22 1700  BP: (!) 179/91 (!) 159/91 (!) 185/89 (!) 156/80  Pulse: 94 85 82   Resp: 18 18 16    Temp:  98.5 F (36.9 C) 98.5 F (36.9 C)   TempSrc:  Oral Oral   SpO2: 97% 94% 95%   Weight:      Height:       General: Not in acute distress HEENT:       Eyes: PERRL, EOMI, no jaundice       ENT: No discharge from the ears and nose, no pharynx injection, no tonsillar enlargement.        Neck: No JVD, no bruit, no mass felt. Heme: No neck lymph node enlargement. Cardiac: S1/S2, RRR, No murmurs, No gallops or rubs. Respiratory: No rales, wheezing, rhonchi or rubs. GI: Soft, nondistended, nontender, no rebound pain, no organomegaly, BS present. GU: No hematuria Ext: No pitting leg edema bilaterally. 1+DP/PT pulse bilaterally. Musculoskeletal: No joint deformities, No joint redness or warmth, no limitation of ROM in spin. Skin: No  rashes.  Neuro: Alert, oriented X3, cranial nerves II-XII grossly intact, moves all extremities normally. Psych: Patient is not psychotic, no suicidal or hemocidal ideation.  Labs on Admission: I have personally reviewed following labs and imaging studies  CBC: Recent Labs  Lab 12/02/22 2207  WBC 5.1  NEUTROABS 3.7  HGB 12.4  HCT 36.9  MCV 84.6  PLT 70*   Basic Metabolic Panel: Recent Labs  Lab 12/02/22 2207 12/03/22 0334  NA 136  --   K 3.2*  --   CL 101  --   CO2 24  --   GLUCOSE 170*  --   BUN 11  --   CREATININE 0.87  --   CALCIUM 8.9  --   MG  --  1.8  PHOS  --  2.8   GFR: Estimated Creatinine Clearance: 87 mL/min (by C-G formula based on SCr of 0.87 mg/dL). Liver Function Tests: Recent Labs  Lab 12/02/22 2207  AST 30  ALT 29  ALKPHOS 97  BILITOT 1.7*  PROT 7.6  ALBUMIN 3.9  Recent Labs  Lab 12/02/22 2207  LIPASE 25   No results for input(s): "AMMONIA" in the last 168 hours. Coagulation Profile: No results for input(s): "INR", "PROTIME" in the last 168 hours. Cardiac Enzymes: No results for input(s): "CKTOTAL", "CKMB", "CKMBINDEX", "TROPONINI" in the last 168 hours. BNP (last 3 results) No results for input(s): "PROBNP" in the last 8760 hours. HbA1C: No results for input(s): "HGBA1C" in the last 72 hours. CBG: Recent Labs  Lab 12/03/22 1537  GLUCAP 123*   Lipid Profile: Recent Labs    12/03/22 0334  CHOL 125  HDL 41  LDLCALC 66  TRIG 92  CHOLHDL 3.0   Thyroid Function Tests: No results for input(s): "TSH", "T4TOTAL", "FREET4", "T3FREE", "THYROIDAB" in the last 72 hours. Anemia Panel: No results for input(s): "VITAMINB12", "FOLATE", "FERRITIN", "TIBC", "IRON", "RETICCTPCT" in the last 72 hours. Urine analysis:    Component Value Date/Time   COLORURINE YELLOW (A) 12/02/2022 2209   APPEARANCEUR CLOUDY (A) 12/02/2022 2209   APPEARANCEUR Hazy 04/29/2014 1029   LABSPEC 1.011 12/02/2022 2209   LABSPEC 1.016 04/29/2014 1029    PHURINE 6.0 12/02/2022 2209   GLUCOSEU NEGATIVE 12/02/2022 2209   GLUCOSEU Negative 04/29/2014 1029   HGBUR SMALL (A) 12/02/2022 2209   BILIRUBINUR NEGATIVE 12/02/2022 2209   BILIRUBINUR Negative 04/29/2014 1029   KETONESUR NEGATIVE 12/02/2022 2209   PROTEINUR 30 (A) 12/02/2022 2209   NITRITE NEGATIVE 12/02/2022 2209   LEUKOCYTESUR LARGE (A) 12/02/2022 2209   LEUKOCYTESUR Negative 04/29/2014 1029   Sepsis Labs: @LABRCNTIP (procalcitonin:4,lacticidven:4) )No results found for this or any previous visit (from the past 240 hour(s)).   Radiological Exams on Admission: CT ABDOMEN PELVIS W CONTRAST  Result Date: 12/03/2022 CLINICAL DATA:  Abdominal pain, nausea, and vomiting. Breast cancer patient on chemotherapy, most recent infusion 5 days ago. EXAM: CT ABDOMEN AND PELVIS WITH CONTRAST TECHNIQUE: Multidetector CT imaging of the abdomen and pelvis was performed using the standard protocol following bolus administration of intravenous contrast. RADIATION DOSE REDUCTION: This exam was performed according to the departmental dose-optimization program which includes automated exposure control, adjustment of the mA and/or kV according to patient size and/or use of iterative reconstruction technique. CONTRAST:  OMNIPAQUE IOHEXOL 300 MG/ML  SOLN COMPARISON:  CT with IV and oral contrast 10/20/2015 FINDINGS: Lower chest: There is mild atelectasis posteriorly, mild elevation right hemidiaphragm. Lung bases are clear of infiltrates. There is moderate calcification in the inferior mitral ring. There is an infusion catheter terminating in the right atrium. There are coronary artery calcifications, slight cardiomegaly. In the outer left breast there is a homogeneous fluid collection measuring 3.2 x 3.1 cm with a Hounsfield density of 15, most likely either a seroma, breast cyst or liquified hematoma and not fully imaged. No wall complexity is seen in the visualized portion. There are coarse scar-like markings  adjacent to this. Presumably the patient has outside breast imaging more recent than the most recent mammograms in our system from August 2015. Correlation with outside studies recommended for possible significance of this. Hepatobiliary: The liver is mildly steatotic without mass enhancement. There are multiple rounded noncalcified stones in the gallbladder, largest is 1.2 cm. No wall thickening or bile duct dilatation is seen. Pancreas: Mild fatty replacement in the head and neck portions and uncinate process. No mass enhancement or acute abnormality. Spleen: No abnormality. Adrenals/Urinary Tract: There is no adrenal mass. Posteriorly in the right kidney there is a 1.3 cm homogeneous cyst with a Hounsfield density of 0.8, consistent with a Bosniak 1  cyst requiring no follow-up. There is a 6 mm hypodensity in the outer midpole right kidney which is too small to characterize but consistent with a Bosniak 2 cyst. No follow-up imaging is recommended. The remainder of the kidneys enhance homogeneously. There is symmetric excretion in the delayed phase. There is no urinary stone or obstruction. There is either mild thickening of the bladder wall or exaggeration from underdistention. Stomach/Bowel: No dilatation or wall thickening. Uncomplicated sigmoid diverticulosis. An appendix is not seen in this patient. Vascular/Lymphatic: Aortic atherosclerosis. No enlarged abdominal or pelvic lymph nodes. Reproductive: Uterus and bilateral adnexa are unremarkable. Multiple pelvic phleboliths. Other: No abdominal wall hernia or abnormality. No abdominopelvic ascites. Musculoskeletal: Extensive bridging enthesopathy of the lower thoracic spine. Advanced facet hypertrophy lumbar spine with acquired spinal stenosis L3-4 and L4-5. Severe acquired foraminal stenosis lowest 3 lumbar levels. No destructive bone lesions are seen. IMPRESSION: 1. Cystitis versus bladder nondistention. 2. Cholelithiasis without evidence of acute  cholecystitis. 3. Mild hepatic steatosis. 4. Uncomplicated sigmoid diverticulosis. 5. Aortic and coronary artery atherosclerosis. 6. 3.2 x 3.1 cm homogeneous fluid collection in the outer left breast, most likely either a seroma, breast cyst or liquified hematoma. Presumably the patient has outside breast imaging more recent than the most recent mammograms in our system from August 2015. Correlation with outside studies recommended for possible significance of this. Aortic Atherosclerosis (ICD10-I70.0). Electronically Signed   By: Almira Bar M.D.   On: 12/03/2022 03:23      Assessment/Plan Principal Problem:   UTI (urinary tract infection) Active Problems:   Myocardial injury   Breast cancer (HCC)   HTN (hypertension)   HLD (hyperlipidemia)   OSA on CPAP   Obesity, Class III, BMI 40-49.9 (morbid obesity) (HCC)   Assessment and Plan:  UTI (urinary tract infection): Patient is not septic clinically.  Hemodynamically stable -Placed on telemetry bed for observation -IV Rocephin -Follow-up urine culture  Myocardial injury: Troponin level 20, 34, 32, no chest pain. -Check A1c, FLP -Check UDS -Aspirin-Lipitor  Breast cancer [facility]): S/p of left lumpectomy.  Patient is doing chemotherapy currently -Follow-up with Dr. Barry Dienes  HTN (hypertension) -IV hydralazine as needed -Cozaar  HLD (hyperlipidemia) -Lipitor  OSA -on CPAP  Obesity, Class III, BMI 40-49.9 (morbid obesity) (HCC): Body weight was 24.3 kg, BMI 48.54 -Encourage to losing weight -Exercise and healthy diet   Thrombocytopenia: Platelet 70 (149 on 11/28/2022), likely due to chemotherapy -Check LDH and peripheral smear    DVT ppx: SCD  Code Status: Full code    Family Communication: I offered to call her family, but patient states that her daughter was with her earlier, her daughter knows what is going on for her.  She said I do need to call her family.  Disposition Plan:  Anticipate discharge back to previous  environment  Consults called: none    Admission status and Level of care: Telemetry Medical:    for obs    Dispo: The patient is from: Home              Anticipated d/c is to: Home              Anticipated d/c date is: 1 day              Patient currently is not medically stable to d/c.    Severity of Illness:  The appropriate patient status for this patient is OBSERVATION. Observation status is judged to be reasonable and necessary in order to provide the required intensity of  service to ensure the patient's safety. The patient's presenting symptoms, physical exam findings, and initial radiographic and laboratory data in the context of their medical condition is felt to place them at decreased risk for further clinical deterioration. Furthermore, it is anticipated that the patient will be medically stable for discharge from the hospital within 2 midnights of admission.        Date of Service 12/03/2022    Lorretta Harp [facility]   If 7PM-7AM, please contact night-coverage [email or web address] 12/03/2022, 6:14 PM

## 2022-12-03 NOTE — Plan of Care (Signed)
  Problem: Education: Goal: Knowledge of General Education information will improve Description: Including pain rating scale, medication(s)/side effects and non-pharmacologic comfort measures Outcome: Progressing   Problem: Clinical Measurements: Goal: Ability to maintain clinical measurements within normal limits will improve Outcome: Progressing Goal: Diagnostic test results will improve Outcome: Progressing   

## 2022-12-03 NOTE — ED Provider Notes (Signed)
[facility] Provider Note    Event Date/Time   First MD Initiated Contact with Patient 12/03/22 0017     (approximate)   History   Nausea   HPI  Leslie Bautista is a 62 y.o. female who presents to the emergency department today because of concerns for nausea.  Symptoms started after the patient received an infusion of chemotherapy 5 days ago.  This is the first time she got this drug.  Since then she has been having nausea.  She has been having some abdominal discomfort with this and low back pain.  Patient denies any burning with urination or bad odor although has had slightly increased frequency of urination.  Patient denies any fevers.  Has had decreased oral intake.     Physical Exam   Triage Vital Signs: ED Triage Vitals  Encounter Vitals Group     BP 12/02/22 2158 110/70     Systolic BP Percentile --      Diastolic BP Percentile --      Pulse Rate 12/02/22 2158 (!) 106     Resp 12/02/22 2158 19     Temp 12/02/22 2158 99 F (37.2 C)     Temp Source 12/02/22 2158 Oral     SpO2 12/02/22 2158 97 %     Weight 12/02/22 2200 274 lb (124.3 kg)     Height 12/02/22 2200 5\' 3"  (1.6 m)     Head Circumference --      Peak Flow --      Pain Score 12/02/22 2200 8     Pain Loc --      Pain Education --      Exclude from Growth Chart --     Most recent vital signs: Vitals:   12/02/22 2158 12/03/22 0016  BP: 110/70 136/72  Pulse: (!) 106 90  Resp: 19 18  Temp: 99 F (37.2 C) 98.9 F (37.2 C)  SpO2: 97% 97%   General: Awake, alert, oriented. CV:  Good peripheral perfusion. Regular rate and rhythm. Resp:  Normal effort. Lungs clear. Abd:  No distention. Tender to palpation in RUQ.   ED Results / Procedures / Treatments   Labs (all labs ordered are listed, but only abnormal results are displayed) Labs Reviewed  COMPREHENSIVE METABOLIC PANEL - Abnormal; Notable for the following components:      Result Value   Potassium 3.2 (*)     Glucose, Bld 170 (*)    Total Bilirubin 1.7 (*)    All other components within normal limits  CBC WITH DIFFERENTIAL/PLATELET - Abnormal; Notable for the following components:   Platelets 70 (*)    Abs Immature Granulocytes 0.31 (*)    All other components within normal limits  URINALYSIS, ROUTINE W REFLEX MICROSCOPIC - Abnormal; Notable for the following components:   Color, Urine YELLOW (*)    APPearance CLOUDY (*)    Hgb urine dipstick SMALL (*)    Protein, ur 30 (*)    Leukocytes,Ua LARGE (*)    Bacteria, UA MANY (*)    All other components within normal limits  TROPONIN I (HIGH SENSITIVITY) - Abnormal; Notable for the following components:   Troponin I (High Sensitivity) 20 (*)    All other components within normal limits  TROPONIN I (HIGH SENSITIVITY) - Abnormal; Notable for the following components:   Troponin I (High Sensitivity) 34 (*)    All other components within normal limits  URINE CULTURE  LIPASE, BLOOD  EKG  I, Phineas Semen, attending physician, personally viewed and interpreted this EKG  EKG Time: 0638 Rate: 83 Rhythm: sinus rhythm Axis: rightward axis Intervals: qtc 454 QRS: narrow ST changes: no st elevation Impression: abnormal ekg    RADIOLOGY I independently interpreted and visualized the CT abd/pel. My interpretation: No free air. No large collection of fluid. Radiology interpretation:  IMPRESSION:  1. Cystitis versus bladder nondistention.  2. Cholelithiasis without evidence of acute cholecystitis.  3. Mild hepatic steatosis.  4. Uncomplicated sigmoid diverticulosis.  5. Aortic and coronary artery atherosclerosis.  6. 3.2 x 3.1 cm homogeneous fluid collection in the outer left  breast, most likely either a seroma, breast cyst or liquified  hematoma. Presumably the patient has outside breast imaging more  recent than the most recent mammograms in our system from August  2015. Correlation with outside studies recommended for possible   significance of this.    Aortic Atherosclerosis (ICD10-I70.0).      PROCEDURES:  Critical Care performed: No    MEDICATIONS ORDERED IN ED: Medications - No data to display   IMPRESSION / MDM / ASSESSMENT AND PLAN / ED COURSE  I reviewed the triage vital signs and the nursing notes.                              Differential diagnosis includes, but is not limited to, medication side effect, gastroenteritis, liver disease  Patient's presentation is most consistent with acute presentation with potential threat to life or bodily function.   The patient is on the cardiac monitor to evaluate for evidence of arrhythmia and/or significant heart rate changes.  Patient presents to the emergency department today because of concerns for nausea that started shortly after getting infusion of new chemotherapy drug.  On exam patient is tender in the right upper quadrant.  Blood work shows slight hypokalemia.  Will plan on starting IV fluids and nausea medication.  Additionally will obtain CT to evaluate abdominal pain.  UA does have large white blood cells however also has large squamous epithelial.    CT scan does not show any significant acute findings.  It does raise question of cystitis versus under distended bladder.  Given this finding along with UA will plan on starting antibiotics.  Troponin however went from 20-34 on recheck.  Patient continues to be without chest pain.  Did have a discussion with the patient.  At this time we will plan on admission to the hospital for further observation.  Dr. Arville Care with hospitalist service will plan on admission.       FINAL CLINICAL IMPRESSION(S) / ED DIAGNOSES   Final diagnoses:  Nausea  Elevated troponin  Lower urinary tract infectious disease     Note:  This document was prepared using Dragon voice recognition software and may include unintentional dictation errors.    Phineas Semen, MD 12/03/22 [phone]

## 2022-12-04 ENCOUNTER — Observation Stay: Payer: 59

## 2022-12-04 DIAGNOSIS — R1314 Dysphagia, pharyngoesophageal phase: Secondary | ICD-10-CM | POA: Diagnosis present

## 2022-12-04 DIAGNOSIS — G4733 Obstructive sleep apnea (adult) (pediatric): Secondary | ICD-10-CM | POA: Diagnosis present

## 2022-12-04 DIAGNOSIS — I10 Essential (primary) hypertension: Secondary | ICD-10-CM | POA: Diagnosis present

## 2022-12-04 DIAGNOSIS — N3001 Acute cystitis with hematuria: Secondary | ICD-10-CM | POA: Diagnosis not present

## 2022-12-04 DIAGNOSIS — Z8249 Family history of ischemic heart disease and other diseases of the circulatory system: Secondary | ICD-10-CM | POA: Diagnosis not present

## 2022-12-04 DIAGNOSIS — E785 Hyperlipidemia, unspecified: Secondary | ICD-10-CM | POA: Diagnosis present

## 2022-12-04 DIAGNOSIS — Z79899 Other long term (current) drug therapy: Secondary | ICD-10-CM | POA: Diagnosis not present

## 2022-12-04 DIAGNOSIS — T451X5A Adverse effect of antineoplastic and immunosuppressive drugs, initial encounter: Secondary | ICD-10-CM | POA: Diagnosis present

## 2022-12-04 DIAGNOSIS — R7303 Prediabetes: Secondary | ICD-10-CM | POA: Diagnosis present

## 2022-12-04 DIAGNOSIS — Z885 Allergy status to narcotic agent status: Secondary | ICD-10-CM | POA: Diagnosis not present

## 2022-12-04 DIAGNOSIS — G459 Transient cerebral ischemic attack, unspecified: Secondary | ICD-10-CM | POA: Diagnosis not present

## 2022-12-04 DIAGNOSIS — D6959 Other secondary thrombocytopenia: Secondary | ICD-10-CM | POA: Diagnosis present

## 2022-12-04 DIAGNOSIS — C50919 Malignant neoplasm of unspecified site of unspecified female breast: Secondary | ICD-10-CM | POA: Diagnosis not present

## 2022-12-04 DIAGNOSIS — C50912 Malignant neoplasm of unspecified site of left female breast: Secondary | ICD-10-CM | POA: Diagnosis present

## 2022-12-04 DIAGNOSIS — Z791 Long term (current) use of non-steroidal anti-inflammatories (NSAID): Secondary | ICD-10-CM | POA: Diagnosis not present

## 2022-12-04 DIAGNOSIS — D701 Agranulocytosis secondary to cancer chemotherapy: Secondary | ICD-10-CM | POA: Diagnosis present

## 2022-12-04 DIAGNOSIS — Z5986 Financial insecurity: Secondary | ICD-10-CM | POA: Diagnosis not present

## 2022-12-04 DIAGNOSIS — Z6841 Body Mass Index (BMI) 40.0 and over, adult: Secondary | ICD-10-CM | POA: Diagnosis not present

## 2022-12-04 DIAGNOSIS — Z96651 Presence of right artificial knee joint: Secondary | ICD-10-CM | POA: Diagnosis present

## 2022-12-04 DIAGNOSIS — I5A Non-ischemic myocardial injury (non-traumatic): Secondary | ICD-10-CM | POA: Diagnosis present

## 2022-12-04 DIAGNOSIS — D696 Thrombocytopenia, unspecified: Secondary | ICD-10-CM | POA: Diagnosis not present

## 2022-12-04 DIAGNOSIS — R200 Anesthesia of skin: Secondary | ICD-10-CM | POA: Diagnosis not present

## 2022-12-04 DIAGNOSIS — Z5941 Food insecurity: Secondary | ICD-10-CM | POA: Diagnosis not present

## 2022-12-04 DIAGNOSIS — I700 Atherosclerosis of aorta: Secondary | ICD-10-CM | POA: Diagnosis present

## 2022-12-04 DIAGNOSIS — R42 Dizziness and giddiness: Secondary | ICD-10-CM | POA: Diagnosis not present

## 2022-12-04 DIAGNOSIS — N390 Urinary tract infection, site not specified: Secondary | ICD-10-CM | POA: Diagnosis present

## 2022-12-04 DIAGNOSIS — I2510 Atherosclerotic heart disease of native coronary artery without angina pectoris: Secondary | ICD-10-CM | POA: Diagnosis present

## 2022-12-04 DIAGNOSIS — E876 Hypokalemia: Secondary | ICD-10-CM | POA: Diagnosis present

## 2022-12-04 LAB — CBC WITH DIFFERENTIAL/PLATELET
Abs Immature Granulocytes: 0.07 10*3/uL (ref 0.00–0.07)
Basophils Absolute: 0 10*3/uL (ref 0.0–0.1)
Basophils Relative: 3 %
Eosinophils Absolute: 0.1 10*3/uL (ref 0.0–0.5)
Eosinophils Relative: 5 %
HCT: 32.5 % — ABNORMAL LOW (ref 36.0–46.0)
Hemoglobin: 11.4 g/dL — ABNORMAL LOW (ref 12.0–15.0)
Immature Granulocytes: 7 %
Lymphocytes Relative: 54 %
Lymphs Abs: 0.6 10*3/uL — ABNORMAL LOW (ref 0.7–4.0)
MCH: 29 pg (ref 26.0–34.0)
MCHC: 35.1 g/dL (ref 30.0–36.0)
MCV: 82.7 fL (ref 80.0–100.0)
Monocytes Absolute: 0.1 10*3/uL (ref 0.1–1.0)
Monocytes Relative: 9 %
Neutro Abs: 0.2 10*3/uL — CL (ref 1.7–7.7)
Neutrophils Relative %: 22 %
Platelets: 53 10*3/uL — ABNORMAL LOW (ref 150–400)
RBC: 3.93 MIL/uL (ref 3.87–5.11)
RDW: 13.1 % (ref 11.5–15.5)
Smear Review: NORMAL
WBC: 1.1 10*3/uL — CL (ref 4.0–10.5)
nRBC: 0 % (ref 0.0–0.2)

## 2022-12-04 LAB — CBC
HCT: 33.6 % — ABNORMAL LOW (ref 36.0–46.0)
Hemoglobin: 11.3 g/dL — ABNORMAL LOW (ref 12.0–15.0)
MCH: 28.7 pg (ref 26.0–34.0)
MCHC: 33.6 g/dL (ref 30.0–36.0)
MCV: 85.3 fL (ref 80.0–100.0)
Platelets: 51 10*3/uL — ABNORMAL LOW (ref 150–400)
RBC: 3.94 MIL/uL (ref 3.87–5.11)
RDW: 13 % (ref 11.5–15.5)
WBC: 0.9 10*3/uL — CL (ref 4.0–10.5)
nRBC: 0 % (ref 0.0–0.2)

## 2022-12-04 LAB — BASIC METABOLIC PANEL
Anion gap: 7 (ref 5–15)
BUN: 9 mg/dL (ref 8–23)
CO2: 27 mmol/L (ref 22–32)
Calcium: 9 mg/dL (ref 8.9–10.3)
Chloride: 104 mmol/L (ref 98–111)
Creatinine, Ser: 0.75 mg/dL (ref 0.44–1.00)
GFR, Estimated: >60 mL/min (ref >60)
Glucose, Bld: 122 mg/dL — ABNORMAL HIGH (ref 70–99)
Potassium: 3.5 mmol/L (ref 3.5–5.1)
Sodium: 138 mmol/L (ref 135–145)

## 2022-12-04 LAB — GLUCOSE, CAPILLARY: Glucose-Capillary: 120 mg/dL — ABNORMAL HIGH (ref 70–99)

## 2022-12-04 LAB — URINE CULTURE

## 2022-12-04 LAB — HIV ANTIBODY (ROUTINE TESTING W REFLEX): HIV Screen 4th Generation wRfx: NONREACTIVE

## 2022-12-04 MED ORDER — ADULT MULTIVITAMIN W/MINERALS CH
1.0000 | ORAL_TABLET | Freq: Every day | ORAL | Status: DC
  Administered 2022-12-04 – 2022-12-06 (×3): 1 via ORAL
  Filled 2022-12-04 (×3): qty 1

## 2022-12-04 MED ORDER — PANTOPRAZOLE SODIUM 40 MG PO TBEC
40.0000 mg | DELAYED_RELEASE_TABLET | Freq: Every day | ORAL | Status: DC
  Administered 2022-12-05: 40 mg via ORAL
  Filled 2022-12-04: qty 1

## 2022-12-04 MED ORDER — ENSURE ENLIVE PO LIQD
237.0000 mL | Freq: Three times a day (TID) | ORAL | Status: DC
  Administered 2022-12-05 (×2): 237 mL via ORAL

## 2022-12-04 MED ORDER — POLYETHYLENE GLYCOL 3350 17 G PO PACK
17.0000 g | PACK | Freq: Every day | ORAL | Status: DC
  Administered 2022-12-04 – 2022-12-06 (×2): 17 g via ORAL
  Filled 2022-12-04 (×3): qty 1

## 2022-12-04 MED ORDER — CHLORHEXIDINE GLUCONATE CLOTH 2 % EX PADS
6.0000 | MEDICATED_PAD | Freq: Every day | CUTANEOUS | Status: DC
  Administered 2022-12-04 – 2022-12-05 (×2): 6 via TOPICAL

## 2022-12-04 NOTE — Progress Notes (Signed)
Initial Nutrition Assessment  DOCUMENTATION CODES:   Morbid obesity  INTERVENTION:   -Ensure Enlive po TID, each supplement provides 350 kcal and 20 grams of protein.  -MVI with minerals daily -Liberalize diet to regular for widest variety of meal selections  NUTRITION DIAGNOSIS:   Increased nutrient needs related to cancer and cancer related treatments as evidenced by estimated needs.  GOAL:   Patient will meet greater than or equal to 90% of their needs  MONITOR:   PO intake, Supplement acceptance  REASON FOR ASSESSMENT:   Malnutrition Screening Tool    ASSESSMENT:   Pt with medical history significant of breast cancer (s/p of lumpectomy, on chemotherapy currently), hypertension, hyperlipidemia, prediabetes, obesity, OSA on CPAP, who presents with increased urinary frequency, nausea.  Pt admitted with UTI.   Reviewed I/O's: +120 ml x 24 hours and +360 ml since admission   Per MD notes, pt still with abdominal pain. Plan for HIDA scan tomorrow.   Spoke with pt at bedside and daughter on speakerphone. Pt shares that he has had a decreased in appetite since starting chemotherapy approximately 2 months ago. Per pt, she has had tastes changes and most foods "taste nasty". She was unable to describe taste, but denies metallic taste, describing that nothing tastes as she should. She is able to taste some foods, such as coffee, werther's candy, pineapple, and peanut butter crackers. She also admits that she has been forcing herself to eat despite foul taste. Noted meal completions 50-90%. Per pt, she has not eaten anything all day.   Per pt, her UBW is around 299#. She estimates she has lost 6-10# over the past 2 months. Reviewed wt hx; pt has experienced a 5.7% wt loss over the past 6 months, which is not significant for time frame.   Discussed importance of good meal and supplement intake to promote healing. Pt has not tried supplements, but is amenable to. Reviewed available  supplements; would like to try Ensure.   Medications reviewed and include miralax.   Labs reviewed: CBGS: 120.    NUTRITION - FOCUSED PHYSICAL EXAM:  Flowsheet Row Most Recent Value  Orbital Region No depletion  Upper Arm Region No depletion  Thoracic and Lumbar Region No depletion  Buccal Region No depletion  Temple Region No depletion  Clavicle Bone Region No depletion  Clavicle and Acromion Bone Region No depletion  Scapular Bone Region No depletion  Dorsal Hand No depletion  Patellar Region No depletion  Anterior Thigh Region No depletion  Posterior Calf Region No depletion  Edema (RD Assessment) Mild  Hair Reviewed  Eyes Reviewed  Mouth Reviewed  Skin Reviewed  Nails Reviewed       Diet Order:   Diet Order             Diet NPO time specified  Diet effective midnight           Diet regular Fluid consistency: Thin  Diet effective now                   EDUCATION NEEDS:   Education needs have been addressed  Skin:  Skin Assessment: Reviewed RN Assessment  Last BM:  12/02/22  Height:   Ht Readings from Last 1 Encounters:  12/02/22 5\' 3"  (1.6 m)    Weight:   Wt Readings from Last 1 Encounters:  12/02/22 124.3 kg    Ideal Body Weight:  52.3 kg  BMI:  Body mass index is 48.54 kg/m.  Estimated Nutritional Needs:  Kcal:  1800-2000  Protein:  90-105 grams  Fluid:  > 1.8 L    Levada Schilling, RD, LDN, CDCES Registered Dietitian II Certified Diabetes Care and Education Specialist Please refer to [facility] for RD and/or RD on-call/weekend/after hours pager

## 2022-12-04 NOTE — Progress Notes (Signed)
[facility] Progress Note  Patient: Leslie Bautista    WUJ:[id number]  DOA: 12/03/2022     Date of Service: the patient was seen and examined on 12/04/2022  Chief Complaint  Patient presents with   Nausea    Breast CA patient (receives IV chemo) here tonight for nausea following her chemotherapy infusion on Tuesday; She states that this was her first infusion with a new drug and her prescribed Zofran is not helping   Brief hospital course: Leslie Bautista is a 62 y.o. female with medical history significant of breast cancer (s/p of lumpectomy, on chemotherapy currently), hypertension, hyperlipidemia, prediabetes, obesity, OSA on CPAP, who presents with increased urinary frequency, nausea. As per patient she has increased urinary frequency for past several days. No any other urinary complaints any other urinary complaints.  Currently patient is on chemotherapy.  Patient is also complaining of abdominal pain whenever she eats or drinks.  No any other active issues  ED w/up: Vital stable, CBC shows thrombocytopenia platelet 70k, hypokalemia potassium 3.2, glucose 170 elevated total bilirubin 1.7 elevated, troponin 34 slightly elevated UA positive for nitrites, WBC >50 and many bacteria  CT abdomen/pelvis: 1. Cystitis versus bladder nondistention. 2. Cholelithiasis without evidence of acute cholecystitis. 3. Mild hepatic steatosis. 4. Uncomplicated sigmoid diverticulosis. 5. Aortic and coronary artery atherosclerosis. 6. 3.2 x 3.1 cm homogeneous fluid collection in the outer left breast, most likely either a seroma, breast cyst or liquified hematoma. Presumably the patient has outside breast imaging more recent than the most recent mammograms in our system from August 2015. Correlation with outside studies recommended for possible significance of this. Aortic Atherosclerosis (ICD10-I70.0).  Assessment and Plan:  UTI (urinary tract infection): Patient is not septic clinically.   Hemodynamically stable -Placed on telemetry bed for observation -IV Rocephin -urine culture multiple species, suggested recollection Outpatient for resolution of symptoms tomorrow a.m.   Myocardial injury: Troponin level 20, 34, 32, no chest pain. - A1c still in process, FLP LDL 66 -UDS negative -Aspirin-Lipitor   Breast cancer: S/p of left lumpectomy.  Patient is doing chemotherapy currently CT scan shows fluid collection outer side of left breast.  Commend to follow-up with oncologist and general surgery as an outpatient -Follow-up with Dr. Barry Dienes   HTN (hypertension) -IV hydralazine as needed -Cozaar   HLD (hyperlipidemia) -Lipitor   OSA -on CPAP   Obesity, Class III, BMI 40-49.9 (morbid obesity) (HCC): Body weight was 24.3 kg, BMI 48.54 -Encourage to losing weight -Exercise and healthy diet     Thrombocytopenia: Platelet 70 (149 on 11/28/2022), likely due to chemotherapy Platelet count 70-20 51, trending down LDH 202 and peripheral smear platelet morphology unremarkable  Leukopenia and neutropenia WBC 0.9, ANC 0.2 Most likely due to chemotherapy Continue to monitor CBC with differential, may need to get an opinion from oncologist if patient spikes fever or persistently low ANC    Body mass index is 48.54 kg/m.  Interventions:  Diet: Low-fat diet DVT Prophylaxis: SCD, pharmacological prophylaxis contraindicated due to thrombocytopenia    Advance goals of care discussion: Full code  Family Communication: family was notpresent at bedside, at the time of interview.  The pt provided permission to discuss medical plan with the family. Opportunity was given to ask question and all questions were answered satisfactorily.   Disposition:  Pt is from Home, admitted with Abd pain, UTI, cholelithiasis, still has abdominal pain, HIDA scan pending, which precludes a safe discharge. Discharge to Home, when stable most likely tomorrow a.m.  Subjective: No significant  events  overnight, patient still complaining of pain in the upper abdomen and epigastric area after eating and drinking.  Urinary frequency, no any other complaints. Patient agreed for HIDA scan.  Physical Exam: General: NAD, lying comfortably Appear in no distress, affect appropriate Eyes: PERRLA ENT: Oral Mucosa Clear, moist  Neck: no JVD,  Cardiovascular: S1 and S2 Present, no Murmur,  Respiratory: good respiratory effort, Bilateral Air entry equal and Decreased, no Crackles, no wheezes Abdomen: Bowel Sound present, Soft and no tenderness,  Skin: no rashes Extremities: no Pedal edema, no calf tenderness Neurologic: without any new focal findings Gait not checked due to patient safety concerns  Vitals:   12/03/22 2000 12/03/22 2021 12/04/22 0415 12/04/22 0837  BP: (!) 161/77  (!) 148/76 (!) 146/70  Pulse: 86  79 82  Resp: 16  19 16   Temp: 99.6 F (37.6 C)  98.5 F (36.9 C) (!) 97.5 F (36.4 C)  TempSrc: Oral  Oral Oral  SpO2: 98% 98% 97% 97%  Weight:      Height:        Intake/Output Summary (Last 24 hours) at 12/04/2022 1348 Last data filed at 12/04/2022 1049 Gross per 24 hour  Intake 360 ml  Output --  Net 360 ml   Filed Weights   12/02/22 2200  Weight: 124.3 kg    Data Reviewed: I have personally reviewed and interpreted daily labs, tele strips, imagings as discussed above. I reviewed all nursing notes, pharmacy notes, vitals, pertinent old records I have discussed plan of care as described above with RN and patient/family.  CBC: Recent Labs  Lab 12/02/22 2207 12/04/22 0519 12/04/22 0523  WBC 5.1 1.1* 0.9*  NEUTROABS 3.7 0.2*  --   HGB 12.4 11.4* 11.3*  HCT 36.9 32.5* 33.6*  MCV 84.6 82.7 85.3  PLT 70* 53* 51*   Basic Metabolic Panel: Recent Labs  Lab 12/02/22 2207 12/03/22 0334 12/04/22 0523  NA 136  --  138  K 3.2*  --  3.5  CL 101  --  104  CO2 24  --  27  GLUCOSE 170*  --  122*  BUN 11  --  9  CREATININE 0.87  --  0.75  CALCIUM 8.9  --  9.0   MG  --  1.8  --   PHOS  --  2.8  --     Studies: No results found.  Scheduled Meds:  aspirin EC  81 mg Oral Daily   atorvastatin  20 mg Oral QHS   feeding supplement  237 mL Oral BID BM   gabapentin  900 mg Oral QHS   hydrOXYzine  25 mg Oral QHS   losartan  50 mg Oral Daily   pantoprazole  40 mg Oral Daily   phenazopyridine  100 mg Oral TID WC   polyethylene glycol  17 g Oral Daily   sertraline  50 mg Oral Daily   Continuous Infusions:  cefTRIAXone (ROCEPHIN)  IV 2 g (12/04/22 0536)   PRN Meds: acetaminophen, albuterol, hydrALAZINE, ondansetron (ZOFRAN) IV  Time spent: 35 minutes  Author: Gillis Santa. MD [facility] 12/04/2022 1:48 PM  To reach On-call, see care teams to locate the attending and reach out to them via [email or web address]. If 7PM-7AM, please contact night-coverage If you still have difficulty reaching the attending provider, please page the [facility] (Director on Call) for [facility] on amion for assistance.

## 2022-12-04 NOTE — Care Management Obs Status (Signed)
MEDICARE OBSERVATION STATUS NOTIFICATION   Patient Details  Name: Leslie Bautista MRN: [id number] Date of Birth: 04-17-1961   Medicare Observation Status Notification Given:  Yes    Halil Rentz, LCSW 12/04/2022, 10:07 AM

## 2022-12-04 NOTE — Progress Notes (Signed)
   12/04/22 1955  BiPAP/CPAP/SIPAP  Reason BIPAP/CPAP not in use Non-compliant (Patient refused CPAP at this time. States she has CPAP at home that she wears "sometimes")

## 2022-12-05 ENCOUNTER — Encounter: Payer: Self-pay | Admitting: Internal Medicine

## 2022-12-05 ENCOUNTER — Inpatient Hospital Stay: Payer: 59

## 2022-12-05 DIAGNOSIS — D701 Agranulocytosis secondary to cancer chemotherapy: Secondary | ICD-10-CM

## 2022-12-05 DIAGNOSIS — C50919 Malignant neoplasm of unspecified site of unspecified female breast: Secondary | ICD-10-CM

## 2022-12-05 DIAGNOSIS — D696 Thrombocytopenia, unspecified: Secondary | ICD-10-CM

## 2022-12-05 DIAGNOSIS — R42 Dizziness and giddiness: Secondary | ICD-10-CM | POA: Diagnosis not present

## 2022-12-05 DIAGNOSIS — N3001 Acute cystitis with hematuria: Secondary | ICD-10-CM | POA: Diagnosis not present

## 2022-12-05 DIAGNOSIS — N390 Urinary tract infection, site not specified: Secondary | ICD-10-CM | POA: Diagnosis not present

## 2022-12-05 DIAGNOSIS — R200 Anesthesia of skin: Secondary | ICD-10-CM | POA: Diagnosis not present

## 2022-12-05 DIAGNOSIS — T451X5A Adverse effect of antineoplastic and immunosuppressive drugs, initial encounter: Secondary | ICD-10-CM

## 2022-12-05 DIAGNOSIS — G459 Transient cerebral ischemic attack, unspecified: Secondary | ICD-10-CM | POA: Diagnosis not present

## 2022-12-05 LAB — BASIC METABOLIC PANEL
Anion gap: 9 (ref 5–15)
BUN: 10 mg/dL (ref 8–23)
CO2: 29 mmol/L (ref 22–32)
Calcium: 9 mg/dL (ref 8.9–10.3)
Chloride: 99 mmol/L (ref 98–111)
Creatinine, Ser: 0.85 mg/dL (ref 0.44–1.00)
GFR, Estimated: >60 mL/min (ref >60)
Glucose, Bld: 109 mg/dL — ABNORMAL HIGH (ref 70–99)
Potassium: 3.4 mmol/L — ABNORMAL LOW (ref 3.5–5.1)
Sodium: 137 mmol/L (ref 135–145)

## 2022-12-05 LAB — CBC WITH DIFFERENTIAL/PLATELET
Abs Immature Granulocytes: 0.01 10*3/uL (ref 0.00–0.07)
Basophils Absolute: 0 10*3/uL (ref 0.0–0.1)
Basophils Relative: 1 %
Eosinophils Absolute: 0 10*3/uL (ref 0.0–0.5)
Eosinophils Relative: 4 %
HCT: 30.9 % — ABNORMAL LOW (ref 36.0–46.0)
Hemoglobin: 10.7 g/dL — ABNORMAL LOW (ref 12.0–15.0)
Immature Granulocytes: 1 %
Lymphocytes Relative: 81 %
Lymphs Abs: 0.7 10*3/uL (ref 0.7–4.0)
MCH: 29.3 pg (ref 26.0–34.0)
MCHC: 34.6 g/dL (ref 30.0–36.0)
MCV: 84.7 fL (ref 80.0–100.0)
Monocytes Absolute: 0.1 10*3/uL (ref 0.1–1.0)
Monocytes Relative: 11 %
Neutro Abs: 0 10*3/uL — CL (ref 1.7–7.7)
Neutrophils Relative %: 2 %
Platelets: 52 10*3/uL — ABNORMAL LOW (ref 150–400)
RBC: 3.65 MIL/uL — ABNORMAL LOW (ref 3.87–5.11)
RDW: 13.2 % (ref 11.5–15.5)
Smear Review: NORMAL
WBC: 0.9 10*3/uL — CL (ref 4.0–10.5)
nRBC: 0 % (ref 0.0–0.2)

## 2022-12-05 LAB — GLUCOSE, CAPILLARY: Glucose-Capillary: 153 mg/dL — ABNORMAL HIGH (ref 70–99)

## 2022-12-05 LAB — MAGNESIUM: Magnesium: 2 mg/dL (ref 1.7–2.4)

## 2022-12-05 LAB — PHOSPHORUS: Phosphorus: 3.3 mg/dL (ref 2.5–4.6)

## 2022-12-05 MED ORDER — GABAPENTIN 300 MG PO CAPS: 600.0000 mg | ORAL_CAPSULE | Freq: Every day | ORAL | Status: DC

## 2022-12-05 MED ORDER — ASPIRIN 81 MG PO CHEW
324.0000 mg | CHEWABLE_TABLET | Freq: Once | ORAL | Status: AC
  Administered 2022-12-06: 324 mg via ORAL
  Filled 2022-12-05: qty 4

## 2022-12-05 MED ORDER — PANTOPRAZOLE SODIUM 40 MG PO TBEC
40.0000 mg | DELAYED_RELEASE_TABLET | Freq: Two times a day (BID) | ORAL | Status: DC
  Administered 2022-12-05 – 2022-12-06 (×2): 40 mg via ORAL
  Filled 2022-12-05 (×2): qty 1

## 2022-12-05 MED ORDER — MECLIZINE HCL 25 MG PO TABS
12.5000 mg | ORAL_TABLET | Freq: Three times a day (TID) | ORAL | Status: DC | PRN
  Filled 2022-12-05: qty 0.5

## 2022-12-05 MED ORDER — HYDROXYZINE HCL 50 MG PO TABS: 50.0000 mg | ORAL_TABLET | Freq: Every day | ORAL | Status: DC

## 2022-12-05 MED ORDER — DICYCLOMINE HCL 10 MG PO CAPS
10.0000 mg | ORAL_CAPSULE | Freq: Three times a day (TID) | ORAL | Status: DC
  Administered 2022-12-05 – 2022-12-06 (×3): 10 mg via ORAL
  Filled 2022-12-05 (×4): qty 1

## 2022-12-05 MED ORDER — GABAPENTIN 300 MG PO CAPS
300.0000 mg | ORAL_CAPSULE | Freq: Three times a day (TID) | ORAL | Status: DC
  Administered 2022-12-05: 300 mg via ORAL
  Filled 2022-12-05 (×2): qty 1

## 2022-12-05 MED ORDER — CHLORHEXIDINE GLUCONATE 0.12 % MT SOLN
10.0000 mL | Freq: Two times a day (BID) | OROMUCOSAL | Status: DC
  Administered 2022-12-05 – 2022-12-06 (×2): 10 mL via OROMUCOSAL
  Filled 2022-12-05 (×2): qty 15

## 2022-12-05 MED ORDER — BLISTEX MEDICATED EX OINT
TOPICAL_OINTMENT | CUTANEOUS | Status: DC | PRN
  Filled 2022-12-05: qty 6.3

## 2022-12-05 MED ORDER — GABAPENTIN 300 MG PO CAPS
300.0000 mg | ORAL_CAPSULE | Freq: Every day | ORAL | Status: DC
  Administered 2022-12-06: 300 mg via ORAL
  Filled 2022-12-05: qty 1

## 2022-12-05 MED ORDER — POTASSIUM CHLORIDE CRYS ER 20 MEQ PO TBCR
40.0000 meq | EXTENDED_RELEASE_TABLET | Freq: Once | ORAL | Status: AC
  Administered 2022-12-05: 40 meq via ORAL

## 2022-12-05 MED ORDER — STROKE: EARLY STAGES OF RECOVERY BOOK: Freq: Once | Status: AC

## 2022-12-05 MED ORDER — NYSTATIN 100000 UNIT/ML MT SUSP
5.0000 mL | Freq: Four times a day (QID) | OROMUCOSAL | Status: DC
  Administered 2022-12-05 – 2022-12-06 (×3): 500000 [IU] via OROMUCOSAL
  Filled 2022-12-05 (×3): qty 5

## 2022-12-05 MED ORDER — PREDNISONE 20 MG PO TABS
40.0000 mg | ORAL_TABLET | Freq: Every day | ORAL | Status: DC
  Administered 2022-12-05 – 2022-12-06 (×2): 40 mg via ORAL
  Filled 2022-12-05 (×2): qty 2

## 2022-12-05 MED ORDER — TECHNETIUM TC 99M MEBROFENIN IV KIT
5.0000 | PACK | Freq: Once | INTRAVENOUS | Status: AC | PRN
  Administered 2022-12-05: 5.35 via INTRAVENOUS

## 2022-12-05 NOTE — Progress Notes (Signed)
Patient complains of intermittent dizziness starting 12/04/22 mid-day, while standing and lying down she states it feels like the whole room is spinning. BP elevated 175/88. Patient states she did not tell anyone about her dizziness prior to now.  RN performed NIH stroke scale on this patient and the patient scored a 1 due to decreased sensation to her LLE and RUE.   RN notified Dr. Arville Care of the above information.   Dr. Arville Care assessed the patient at the bedside.  Orders Received: MRI, Tele, prn Antivert, ASA 324mg  after is MRI completed, q4 NIH/neuro checks.

## 2022-12-05 NOTE — Plan of Care (Signed)
  Problem: Education: Goal: Knowledge of General Education information will improve Description: Including pain rating scale, medication(s)/side effects and non-pharmacologic comfort measures Outcome: Progressing   Problem: Clinical Measurements: Goal: Will remain free from infection Outcome: Progressing   Problem: Clinical Measurements: Goal: Diagnostic test results will improve Outcome: Progressing   Problem: Clinical Measurements: Goal: Respiratory complications will improve Outcome: Progressing   Problem: Clinical Measurements: Goal: Cardiovascular complication will be avoided Outcome: Progressing   Problem: Activity: Goal: Risk for activity intolerance will decrease Outcome: Progressing   Problem: Nutrition: Goal: Adequate nutrition will be maintained Outcome: Progressing   Problem: Coping: Goal: Level of anxiety will decrease Outcome: Progressing   Problem: Elimination: Goal: Will not experience complications related to bowel motility Outcome: Progressing Goal: Will not experience complications related to urinary retention Outcome: Progressing   Problem: Pain Managment: Goal: General experience of comfort will improve Outcome: Progressing   Problem: Safety: Goal: Ability to remain free from injury will improve Outcome: Progressing   Problem: Skin Integrity: Goal: Risk for impaired skin integrity will decrease Outcome: Progressing

## 2022-12-05 NOTE — Plan of Care (Signed)

## 2022-12-05 NOTE — Consult Note (Addendum)
Hematology/Oncology Consult note [phone] [phone]) [phone]      Patient Care Team: [facility] - General   Name of the patient: Leslie Bautista  [id number]  [birth year]   REASON FOR COSULTATION:   History of presenting illness-  62 y.o. female with PMH listed at below who presents to ER for evaluation of increased urinary frequency, nausea.  Patient has a history of breast cancer, status postlumpectomy currently on adjuvant chemotherapy, initially TC, due to reaction to Docetaxel, switched to Abraxane + Cytoxan .  Last chemotherapy treatment was 11/28/2022.  She received Neulasta on 11/30/2022.  12/02/2022 UA showed large amount of leukocytes, patient was admitted for UTI treatments.  Urine culture showed multiple specimen present.  CBC showed platelet count of 70,000, white count 5.1.  7/14, total WBC 1.1, ANC 0.2.  Today 12/05/2022, white count 0.9, platelet count 52,000.  ANC 0.    Hematology oncology was consulted for neutropenia in the context of UTI. Patient reports throat soreness. No dysuria. Just increased urinary frequency.   Allergies  Allergen Reactions   Oxycontin [Oxycodone Hcl] Rash    Patient Active Problem List   Diagnosis Date Noted   UTI (urinary tract infection) 12/03/2022   Myocardial injury 12/03/2022   Obesity, Class III, BMI 40-49.9 (morbid obesity) (HCC) 12/03/2022   Breast cancer (HCC) 12/03/2022   HTN (hypertension) 12/03/2022   HLD (hyperlipidemia) 12/03/2022   OSA on CPAP 12/03/2022   Endometrial thickening on ultrasound 07/05/2021   Dyspareunia due to medical condition in female 10/24/2019   Postmenopause bleeding 03/08/2018   Pre-diabetes 11/22/2015   Depression 06/03/2014   Class 3 severe obesity with serious comorbidity and body mass index (BMI) of 50.0 to 59.9 in adult (HCC) 01/07/2014   Hypertension, benign 10/25/2011   Primary localized osteoarthrosis, lower leg 12/19/2007     Past Medical  History:  Diagnosis Date   Cataract    Depression    Hypertension    Morbid obesity with BMI of 50.0-59.9, adult (HCC)    Obstructive sleep apnea    Osteoarthritis of both knees    Pre-diabetes      Past Surgical History:  Procedure Laterality Date   ANKLE SURGERY Right    ligament repair   CATARACT EXTRACTION, BILATERAL Bilateral 2022   COLONOSCOPY  2015   TONSILLECTOMY Bilateral 06/14/2021   Procedure: TONSILLECTOMY;  Surgeon: Bud Face, MD;  Location: [facility] ORS;  Service: ENT;  Laterality: Bilateral;   TOTAL KNEE ARTHROPLASTY Right    TUBAL LIGATION      Social History   Socioeconomic History   Marital status: Single    Spouse name: Not on file   Number of children: Not on file   Years of education: Not on file   Highest education level: Not on file  Occupational History   Not on file  Tobacco Use   Smoking status: Never   Smokeless tobacco: Never  Vaping Use   Vaping status: Never Used  Substance and Sexual Activity   Alcohol use: Yes    Comment: rarely   Drug use: No   Sexual activity: Not Currently    Birth control/protection: Post-menopausal  Other Topics Concern   Not on file  Social History Narrative   Lives with daughters and grand child   Social Determinants of Health   Financial Resource Strain: Medium Risk (10/06/2022)   Received from [facility], [facility]   Overall Financial Resource Strain (CARDIA)    Difficulty of  Paying Living Expenses: Somewhat hard  Food Insecurity: No Food Insecurity (12/03/2022)   Hunger Vital Sign    Worried About Running Out of Food in the Last Year: Never true    Ran Out of Food in the Last Year: Never true  Recent Concern: Food Insecurity - Food Insecurity Present (11/14/2022)   Received from [facility], [facility]   Hunger Vital Sign    Worried About Running Out of Food in the Last Year: Sometimes true    Ran Out of Food in the Last Year: Never true  Transportation Needs: No  Transportation Needs (12/03/2022)   PRAPARE - Administrator, Civil Service (Medical): No    Lack of Transportation (Non-Medical): No  Physical Activity: Not on file  Stress: No Stress Concern Present (03/29/2020)   Received from [facility], [facility]   [facility] - Occupational Stress Questionnaire    Feeling of Stress : Only a little  Social Connections: Not on file  Intimate Partner Violence: Not At Risk (12/03/2022)   Humiliation, Afraid, Rape, and Kick questionnaire    Fear of Current or Ex-Partner: No    Emotionally Abused: No    Physically Abused: No    Sexually Abused: No     Family History  Problem Relation Age of Onset   Hypertension Sister    Hypertension Brother      Current Facility-Administered Medications:    acetaminophen (TYLENOL) tablet 650 mg, 650 mg, Oral, Q6H PRN, Lorretta Harp, MD, 650 mg at 12/04/22 1936   albuterol (PROVENTIL) (2.5 MG/3ML) 0.083% nebulizer solution 2.5 mg, 2.5 mg, Nebulization, Q6H PRN, Lorretta Harp, MD   aspirin EC tablet 81 mg, 81 mg, Oral, Daily, Lorretta Harp, MD, 81 mg at 12/05/22 1049   atorvastatin (LIPITOR) tablet 20 mg, 20 mg, Oral, QHS, Niu, Xilin, MD, 20 mg at 12/04/22 2211   cefTRIAXone (ROCEPHIN) 2 g in sodium chloride 0.9 % 100 mL IVPB, 2 g, Intravenous, Q24H, Lorretta Harp, MD, Last Rate: 200 mL/hr at 12/05/22 0637, 2 g at 12/05/22 1610   Chlorhexidine Gluconate Cloth 2 % PADS 6 each, 6 each, Topical, Daily, Gillis Santa, MD, 6 each at 12/05/22 1051   feeding supplement (ENSURE ENLIVE / ENSURE PLUS) liquid 237 mL, 237 mL, Oral, TID BM, Gillis Santa, MD, 237 mL at 12/05/22 1056   gabapentin (NEURONTIN) capsule 300 mg, 300 mg, Oral, TID, Gillis Santa, MD, 300 mg at 12/05/22 1158   hydrALAZINE (APRESOLINE) injection 5 mg, 5 mg, Intravenous, Q2H PRN, Lorretta Harp, MD   hydrOXYzine (ATARAX) tablet 25 mg, 25 mg, Oral, QHS, Lorretta Harp, MD, 25 mg at 12/04/22 2211   lip balm (BLISTEX) ointment, ,  Topical, PRN, Gillis Santa, MD   losartan (COZAAR) tablet 50 mg, 50 mg, Oral, Daily, Lorretta Harp, MD, 50 mg at 12/05/22 1049   multivitamin with minerals tablet 1 tablet, 1 tablet, Oral, Daily, Gillis Santa, MD, 1 tablet at 12/05/22 1049   ondansetron (ZOFRAN) injection 4 mg, 4 mg, Intravenous, Q8H PRN, Lorretta Harp, MD, 4 mg at 12/03/22 1500   pantoprazole (PROTONIX) EC tablet 40 mg, 40 mg, Oral, Daily, Gillis Santa, MD, 40 mg at 12/05/22 1049   phenazopyridine (PYRIDIUM) tablet 100 mg, 100 mg, Oral, TID WC, Lorretta Harp, MD, 100 mg at 12/05/22 1049   polyethylene glycol (MIRALAX / GLYCOLAX) packet 17 g, 17 g, Oral, Daily, Gillis Santa, MD, 17 g at 12/04/22 1518   sertraline (ZOLOFT) tablet 50  mg, 50 mg, Oral, Daily, Lorretta Harp, MD, 50 mg at 12/05/22 1049  Review of Systems  Constitutional:  Negative for appetite change, chills, fatigue and fever.  HENT:   Negative for hearing loss and voice change.   Eyes:  Negative for eye problems.  Respiratory:  Negative for chest tightness and cough.   Cardiovascular:  Negative for chest pain.  Gastrointestinal:  Negative for abdominal distention, abdominal pain and blood in stool.  Endocrine: Negative for hot flashes.  Genitourinary:  Positive for frequency. Negative for difficulty urinating.   Musculoskeletal:  Negative for arthralgias.  Skin:  Negative for itching and rash.  Neurological:  Positive for numbness. Negative for extremity weakness.  Hematological:  Negative for adenopathy.  Psychiatric/Behavioral:  Negative for confusion.     PHYSICAL EXAM Vitals:   12/04/22 2040 12/05/22 0433 12/05/22 0531 12/05/22 1051  BP: (!) 143/63 120/79  124/62  Pulse: 84 74 81 82  Resp: 18 18  20   Temp: 98.1 F (36.7 C) 97.9 F (36.6 C)  98.2 F (36.8 C)  TempSrc:    Oral  SpO2: 98% 90% 98% 96%  Weight:      Height:       Physical Exam Constitutional:      General: She is not in acute distress.    Appearance: She is not diaphoretic.  HENT:      Head: Normocephalic and atraumatic.  Eyes:     General: No scleral icterus. Cardiovascular:     Rate and Rhythm: Normal rate and regular rhythm.  Pulmonary:     Effort: Pulmonary effort is normal. No respiratory distress.  Abdominal:     General: There is no distension.     Palpations: Abdomen is soft.     Tenderness: There is no abdominal tenderness.  Musculoskeletal:        General: Normal range of motion.     Cervical back: Normal range of motion and neck supple.  Skin:    General: Skin is warm and dry.     Findings: No erythema.  Neurological:     Mental Status: She is alert and oriented to person, place, and time. Mental status is at baseline.     Cranial Nerves: No cranial nerve deficit.     Motor: No abnormal muscle tone.  Psychiatric:        Mood and Affect: Affect normal.       LABORATORY STUDIES    Latest Ref Rng & Units 12/05/2022    6:40 AM 12/04/2022    5:23 AM 12/04/2022    5:19 AM  CBC  WBC 4.0 - 10.5 K/uL 0.9  0.9  1.1   Hemoglobin 12.0 - 15.0 g/dL 19.1  47.8  29.5   Hematocrit 36.0 - 46.0 % 30.9  33.6  32.5   Platelets 150 - 400 K/uL 52  51  53       Latest Ref Rng & Units 12/05/2022    6:40 AM 12/04/2022    5:23 AM 12/02/2022   10:07 PM  CMP  Glucose 70 - 99 mg/dL 621  308  657   BUN 8 - 23 mg/dL 10  9  11    Creatinine 0.44 - 1.00 mg/dL 8.46  9.62  9.52   Sodium 135 - 145 mmol/L 137  138  136   Potassium 3.5 - 5.1 mmol/L 3.4  3.5  3.2   Chloride 98 - 111 mmol/L 99  104  101   CO2 22 - 32 mmol/L 29  27  24   Calcium 8.9 - 10.3 mg/dL 9.0  9.0  8.9   Total Protein 6.5 - 8.1 g/dL   7.6   Total Bilirubin 0.3 - 1.2 mg/dL   1.7   Alkaline Phos 38 - 126 U/L   97   AST 15 - 41 U/L   30   ALT 0 - 44 U/L   29      RADIOGRAPHIC STUDIES: I have personally reviewed the radiological images as listed and agreed with the findings in the report. CT ABDOMEN PELVIS W CONTRAST  Result Date: 12/03/2022 CLINICAL DATA:  Abdominal pain, nausea, and vomiting. Breast  cancer patient on chemotherapy, most recent infusion 5 days ago. EXAM: CT ABDOMEN AND PELVIS WITH CONTRAST TECHNIQUE: Multidetector CT imaging of the abdomen and pelvis was performed using the standard protocol following bolus administration of intravenous contrast. RADIATION DOSE REDUCTION: This exam was performed according to the departmental dose-optimization program which includes automated exposure control, adjustment of the mA and/or kV according to patient size and/or use of iterative reconstruction technique. CONTRAST:  OMNIPAQUE IOHEXOL 300 MG/ML  SOLN COMPARISON:  CT with IV and oral contrast 10/20/2015 FINDINGS: Lower chest: There is mild atelectasis posteriorly, mild elevation right hemidiaphragm. Lung bases are clear of infiltrates. There is moderate calcification in the inferior mitral ring. There is an infusion catheter terminating in the right atrium. There are coronary artery calcifications, slight cardiomegaly. In the outer left breast there is a homogeneous fluid collection measuring 3.2 x 3.1 cm with a Hounsfield density of 15, most likely either a seroma, breast cyst or liquified hematoma and not fully imaged. No wall complexity is seen in the visualized portion. There are coarse scar-like markings adjacent to this. Presumably the patient has outside breast imaging more recent than the most recent mammograms in our system from August 2015. Correlation with outside studies recommended for possible significance of this. Hepatobiliary: The liver is mildly steatotic without mass enhancement. There are multiple rounded noncalcified stones in the gallbladder, largest is 1.2 cm. No wall thickening or bile duct dilatation is seen. Pancreas: Mild fatty replacement in the head and neck portions and uncinate process. No mass enhancement or acute abnormality. Spleen: No abnormality. Adrenals/Urinary Tract: There is no adrenal mass. Posteriorly in the right kidney there is a 1.3 cm homogeneous cyst  with a Hounsfield density of 0.8, consistent with a Bosniak 1 cyst requiring no follow-up. There is a 6 mm hypodensity in the outer midpole right kidney which is too small to characterize but consistent with a Bosniak 2 cyst. No follow-up imaging is recommended. The remainder of the kidneys enhance homogeneously. There is symmetric excretion in the delayed phase. There is no urinary stone or obstruction. There is either mild thickening of the bladder wall or exaggeration from underdistention. Stomach/Bowel: No dilatation or wall thickening. Uncomplicated sigmoid diverticulosis. An appendix is not seen in this patient. Vascular/Lymphatic: Aortic atherosclerosis. No enlarged abdominal or pelvic lymph nodes. Reproductive: Uterus and bilateral adnexa are unremarkable. Multiple pelvic phleboliths. Other: No abdominal wall hernia or abnormality. No abdominopelvic ascites. Musculoskeletal: Extensive bridging enthesopathy of the lower thoracic spine. Advanced facet hypertrophy lumbar spine with acquired spinal stenosis L3-4 and L4-5. Severe acquired foraminal stenosis lowest 3 lumbar levels. No destructive bone lesions are seen. IMPRESSION: 1. Cystitis versus bladder nondistention. 2. Cholelithiasis without evidence of acute cholecystitis. 3. Mild hepatic steatosis. 4. Uncomplicated sigmoid diverticulosis. 5. Aortic and coronary artery atherosclerosis. 6. 3.2 x 3.1 cm homogeneous fluid collection in the  outer left breast, most likely either a seroma, breast cyst or liquified hematoma. Presumably the patient has outside breast imaging more recent than the most recent mammograms in our system from August 2015. Correlation with outside studies recommended for possible significance of this. Aortic Atherosclerosis (ICD10-I70.0). Electronically Signed   By: Almira Bar M.D.   On: 12/03/2022 03:23     Assessment and plan-   # Chemotherapy induced neutropenia. Patient has received long-acting G-CSF Neulasta on  11/30/2022. She is currently hemodynamically stable and afebrile No need for additional short acting G-CSF.  Expect ANC to improve in the next couple of days. Monitor cbc w diff daily. Recommend Peridex mouth wash to decrease oral flora load.   # Acute UTI, urine culture not diagnostic. continue IV antibiotics due to neutropenia.  # Breast cancer, on adjuvant chemotherapy TC with [facility] oncology Recommend patient to follow up outpatient with [facility] after this admission.  # Thrombocytopenia, chemo induced. No bleeding events.  Observation.   Thank you for allowing me to participate in the care of this patient.   Rickard Patience, MD, PhD Hematology Oncology 12/05/2022

## 2022-12-05 NOTE — Progress Notes (Signed)
Critical care note:  Date of note 12/05/2022  Subjective: The patient started having vertigo this evening with feeling of the room spinning around with associated intermittent dizziness.  She stated that the symptoms have been on and off for the last couple of days.  She was having diminished sensation to light touch in the left leg.  No other focal weakness.  No dysarthria or expressive aphasia.  She has chronic dysphagia which has not changed.  No chest pain or palpitations.  No nausea or vomiting or abdominal pain.  No urinary or stool incontinence.  No tinnitus.  Objective: Physical examination: Generally: Anxious looking acutely ill elderly African-American female in no respiratory distress. Vital signs revealed BP of 148/86 with a heart rate of 83 and respiratory rate of 18 temperature 98.9 pulse oximetry 99% on room air.  BP later on was 167/89. Head - atraumatic, normocephalic.  Pupils - equal, round and reactive to light and accommodation. Extraocular movements are intact. No scleral icterus.  Oropharynx - moist mucous membranes and tongue. No pharyngeal erythema or exudate.  Neck - supple. No JVD. Carotid pulses 2+ bilaterally. No carotid bruits. No palpable thyromegaly or lymphadenopathy. Cardiovascular - regular rate and rhythm. Normal S1 and S2. No murmurs, gallops or rubs.  Lungs - clear to auscultation bilaterally.  Abdomen - soft and nontender. Positive bowel sounds. No palpable organomegaly or masses.  Extremities - no pitting edema, clubbing or cyanosis.  Neuro -cranial nerves II through XII are grossly intact.  Muscle strength is with equal 5/5 in both upper and lower extremities.  She had diminished sensory exam to light touch on the on the left lower extremity. Skin - no rashes. Breast, pelvic and rectal - deferred.  Labs and notes were reviewed.  Assessment/plan: 1.  Dizziness and vertigo with differential diagnoses including vertebrobasilar insufficiency/TIA versus  peripheral vertigo in the setting of left-sided breast cancer status post lumpectomy and chemotherapy. - She was placed on as needed Antivert. - Will obtain a brain MRI without contrast. - She has associated left leg numbness but with otherwise nonfocal. - We will continue to closely monitor her. - We will follow neurochecks every 4 hours for 24 hours. - PT/OT and ST consults will be obtained. - I do not believe that the patient is a candidate for tPA as her symptoms have been intermittent over the last couple of days.  Therefore no code stroke was called.  Authorized and performed by: Valente David, MD Total critical care time:    30    minutes. Due to a high probability of clinically significant, life-threatening deterioration, the patient required my highest level of preparedness to intervene emergently and I personally spent this critical care time directly and personally managing the patient.  This critical care time included obtaining a history, examining the patient, pulse oximetry, ordering and review of studies, arranging urgent treatment with development of management plan, evaluation of patient's response to treatment, frequent reassessment, and discussions with other providers. This critical care time was performed to assess and manage the high probability of imminent, life-threatening deterioration that could result in multiorgan failure.  It was exclusive of separately billable procedures and treating other patients and teaching time.

## 2022-12-05 NOTE — Evaluation (Addendum)
Clinical/Bedside Swallow Evaluation Patient Details  Name: Leslie Bautista MRN: [id number] Date of Birth: 02/07/1961  Today's Date: 12/05/2022 Time: SLP Start Time (ACUTE ONLY): 1415 SLP Stop Time (ACUTE ONLY): 1510 SLP Time Calculation (min) (ACUTE ONLY): 55 min  Past Medical History:  Past Medical History:  Diagnosis Date   Cataract    Depression    Hypertension    Morbid obesity with BMI of 50.0-59.9, adult (HCC)    Obstructive sleep apnea    Osteoarthritis of both knees    Pre-diabetes    Past Surgical History:  Past Surgical History:  Procedure Laterality Date   ANKLE SURGERY Right    ligament repair   CATARACT EXTRACTION, BILATERAL Bilateral 2022   COLONOSCOPY  2015   TONSILLECTOMY Bilateral 06/14/2021   Procedure: TONSILLECTOMY;  Surgeon: Bud Face, MD;  Location: [facility] ORS;  Service: ENT;  Laterality: Bilateral;   TOTAL KNEE ARTHROPLASTY Right    TUBAL LIGATION     HPI:  Pt is a 62 y.o. female with medical history significant of breast cancer (s/p of lumpectomy, on chemotherapy currently), hypertension, hyperlipidemia, prediabetes, Obesity, OSA on CPAP, who presents with increased urinary frequency, Nausea. As per patient she has increased urinary frequency for past several days. No any other urinary complaints any other urinary complaints.  Currently patient is on chemotherapy.  Patient is also complaining of abdominal pain whenever she eats or drinks.  No any other active issues except c/o a "sore" mouth/throat.  Per Dietician's note this admit, "pt has had a decreased in appetite since starting chemotherapy approximately 2 months ago. Per pt, she has had tastes changes and most foods "taste nasty". She was unable to describe taste, but denies metallic taste, describing that nothing tastes as she should. She is able to taste some foods, such as coffee, werther's candy, pineapple, and peanut butter crackers. She also admits that she has been forcing herself to eat  despite foul taste.". Weight loss noted per the note.   CXR:  Lung bases are clear of infiltrates.   CT Soft Tissue of the Neck in 2023: "Two findings which could contribute to globus sensation and/or  difficulty swallowing:  - nonspecific bulky enlargement of the lingual tonsil, and less  pronounced similar enlargement of the adenoids. Recommend  correlation with direct visualization.  Diffuse idiopathic skeletal hyperostosis (DISH), with bulky  anterior cervical endplate osteophytosis at C1 and C4 through C6  effacing the cervical esophagus.".    Assessment / Plan / Recommendation  Clinical Impression   Pt seen for BSE today. Pt awake, verbal and conversive. Pleasant, but clearly has negative feelings towards foods/drinks d/t the nauseated feelings and foul taste she has secondary to ongoing Chemotherapy txs.  On RA, afebrile. WBC not elevated.  Pt appears to present w/ functional oropharyngeal phase swallowing w/ No oropharyngeal phase dysphagia noted, No neuromuscular deficits noted. Pt consumed po trials w/ No overt, clinical s/s of aspiration during po trials. Pt appears at reduced risk for aspiration from an oropharyngeal phase standpoint following general aspiration precautions.   However, pt does have challenging factors that could impact her oropharyngeal swallowing to include ongoing Chemotherapy txs causing a foul taste and Nausea impacting her desire for oral intake, oral pain (MD made aware d/t concern for Thrush), deconditioning/weakness, and suspected Esophageal phase Dysmotility -- Per CT Soft Tissue of Neck in 2023: "diffuse idiopathic skeletal hyperostosis (DISH), with bulky  anterior cervical endplate osteophytosis at C1 and C4 through C6  effacing the cervical esophagus.". These factors  can increase risk for dysphagia, aspiration as well as decreased oral intake overall.   During po trials, pt consumed all consistencies w/ no overt coughing, no decline in vocal quality, nor change in  respiratory presentation during/post trials. O2 sats remained at her baseline in upper 90s when checked. Oral phase appeared [facility] w/ timely bolus management, mastication, and control of bolus propulsion for A-P transfer for swallowing. Oral clearing achieved w/ all trial consistencies -- moistened, soft foods given.  OM Exam appeared [facility] w/ no unilateral weakness noted. Speech Clear. Pt fed self.  OF NOTE: Pt c/o a Globus feeling x2 during the po trials pointing to her Mid-sternum area each time. (MD made aware)  Recommend continue a Regular consistency diet w/ well-Cut/Chopped meats, moistened foods -- foods of choice that she can eat/drink w/out increased Nausea; Thin liquids. Recommend general aspiration and REFLUX precautions, alternate foods and liquids and give Time b/t bites to allow for Esophageal phase clearing. Pills WHOLE in Puree if needed for safer, easier swallowing.  Education given on Pills in Puree; food consistencies and easy to eat options; general aspiration precautions to pt. Discussed need for oral rinse for potential Thrush; also a Barium study to view the Esophageal phase motility.  MD to reconsult if any new needs arise while admitted. Dietician if following for support. NSG updated, agreed.  SLP Visit Diagnosis: Dysphagia, unspecified (R13.10) (recent Chemo txs w/ foul taste and nausea re: po's, per pt report)    Aspiration Risk   (reduced following general precs.)    Diet Recommendation   Thin;Age appropriate regular (cut/chopped meats if eating; moistened foods) = continue a Regular consistency diet w/ well-Cut/Chopped meats, moistened foods -- foods of choice that she can eat/drink w/out increased Nausea; Thin liquids. Recommend general aspiration and REFLUX precautions, alternate foods and liquids and give Time b/t bites to allow for Esophageal phase clearing.   Medication Administration: Whole meds with puree (if needed for ease of swallowing)    Other  Recommendations  Recommended Consults: Consider ENT evaluation;Consider GI evaluation (Dietician following) Oral Care Recommendations: Oral care BID;Patient independent with oral care    Recommendations for follow up therapy are one component of a multi-disciplinary discharge planning process, led by the attending physician.  Recommendations may be updated based on patient status, additional functional criteria and insurance authorization.  Follow up Recommendations No SLP follow up      Assistance Recommended at Discharge  PRN  Functional Status Assessment Patient has had a recent decline in their functional status and demonstrates the ability to make significant improvements in function in a reasonable and predictable amount of time.  Frequency and Duration  (n/a)   (n/a)       Prognosis Prognosis for improved oropharyngeal function: Good (for swallowing) Barriers to Reach Goals: Time post onset;Severity of deficits;Motivation Barriers/Prognosis Comment: Chemotherapy txs ongoing = nausea, foul taste orally; Reflux activity monitoring      Swallow Study   General Date of Onset: 12/03/22 HPI: Pt is a 62 y.o. female with medical history significant of breast cancer (s/p of lumpectomy, on chemotherapy currently), hypertension, hyperlipidemia, prediabetes, Obesity, OSA on CPAP, who presents with increased urinary frequency, Nausea. As per patient she has increased urinary frequency for past several days. No any other urinary complaints any other urinary complaints.  Currently patient is on chemotherapy.  Patient is also complaining of abdominal pain whenever she eats or drinks.  No any other active issues except c/o a "sore" mouth/throat.  Per Dietician's note  this admit, "pt has had a decreased in appetite since starting chemotherapy approximately 2 months ago. Per pt, she has had tastes changes and most foods "taste nasty". She was unable to describe taste, but denies metallic taste, describing that nothing  tastes as she should. She is able to taste some foods, such as coffee, werther's candy, pineapple, and peanut butter crackers. She also admits that she has been forcing herself to eat despite foul taste.". Weight loss noted per the note.   CXR:  Lung bases are clear of infiltrates.  CT Soft Tissue of the Neck in 2023: "Two findings which could contribute to globus sensation and/or  difficulty swallowing:  - nonspecific bulky enlargement of the lingual tonsil, and less  pronounced similar enlargement of the adenoids. Recommend  correlation with direct visualization.  - Diffuse idiopathic skeletal hyperostosis (DISH), with bulky  anterior cervical endplate osteophytosis at C1 and C4 through C6  effacing the cervical esophagus.". Type of Study: Bedside Swallow Evaluation Previous Swallow Assessment: none Diet Prior to this Study: Regular;Thin liquids (Level 0) Temperature Spikes Noted: No (wbc not elevated) Respiratory Status: Room air History of Recent Intubation: No Behavior/Cognition: Alert;Cooperative;Pleasant mood Oral Cavity Assessment:  (slight coating on tongue; pt c/o "sore mouth/throat") Oral Care Completed by SLP: Recent completion by staff Oral Cavity - Dentition: Adequate natural dentition Vision: Functional for self-feeding Self-Feeding Abilities: Able to feed self;Needs set up Patient Positioning: Upright in bed Baseline Vocal Quality: Normal Volitional Cough: Strong Volitional Swallow: Able to elicit    Oral/Motor/Sensory Function Overall Oral Motor/Sensory Function: Within functional limits   Ice Chips Ice chips: Not tested   Thin Liquid Thin Liquid: Within functional limits Presentation: Self Fed;Straw (10 trials)    Nectar Thick Nectar Thick Liquid: Not tested   Honey Thick Honey Thick Liquid: Not tested   Puree Puree: Within functional limits Presentation: Self Fed;Spoon (5 trials)   Solid     Solid: Within functional limits Presentation: Self Fed (2) Other Comments:  grapes accepted        Jerilynn Som, MS, CCC-SLP Speech Language Pathologist Rehab Services; [facility] -  [phone] (ascom) Brecken Dewoody 12/05/2022,4:12 PM

## 2022-12-05 NOTE — Progress Notes (Signed)
[facility] Progress Note  Patient: Leslie Bautista    ZOX:[id number]  DOA: 12/03/2022     Date of Service: the patient was seen and examined on 12/05/2022  Chief Complaint  Patient presents with   Nausea    Breast CA patient (receives IV chemo) here tonight for nausea following her chemotherapy infusion on Tuesday; She states that this was her first infusion with a new drug and her prescribed Zofran is not helping   Brief hospital course: Kamee Bobst is a 62 y.o. female with medical history significant of breast cancer (s/p of lumpectomy, on chemotherapy currently), hypertension, hyperlipidemia, prediabetes, obesity, OSA on CPAP, who presents with increased urinary frequency, nausea. As per patient she has increased urinary frequency for past several days. No any other urinary complaints any other urinary complaints.  Currently patient is on chemotherapy.  Patient is also complaining of abdominal pain whenever she eats or drinks.  No any other active issues  ED w/up: Vital stable, CBC shows thrombocytopenia platelet 70k, hypokalemia potassium 3.2, glucose 170 elevated total bilirubin 1.7 elevated, troponin 34 slightly elevated UA positive for nitrites, WBC >50 and many bacteria  CT abdomen/pelvis: 1. Cystitis versus bladder nondistention. 2. Cholelithiasis without evidence of acute cholecystitis. 3. Mild hepatic steatosis. 4. Uncomplicated sigmoid diverticulosis. 5. Aortic and coronary artery atherosclerosis. 6. 3.2 x 3.1 cm homogeneous fluid collection in the outer left breast, most likely either a seroma, breast cyst or liquified hematoma. Presumably the patient has outside breast imaging more recent than the most recent mammograms in our system from August 2015. Correlation with outside studies recommended for possible significance of this. Aortic Atherosclerosis (ICD10-I70.0).  Assessment and Plan:  UTI (urinary tract infection): Patient is not septic clinically.   Hemodynamically stable -Placed on telemetry bed for observation -IV Rocephin -urine culture multiple species, suggested recollection Continue antibiotics for now due to neutropenia   Upper abdominal pain and complaining of dysphagia Patient is complaining of pain after drinking liquids/water, no abdominal pain with food but patient does have esophageal dysphagia Consulted SLP eval and MBS to check swallowing function Empirically started prednisone 40 mg p.o. daily for 5 days and increase PPI 40 mg p.o. twice daily, Bentyl 10 mg p.o. 3 times daily for symptomatic management   Myocardial injury: Troponin level 20, 34, 32, no chest pain. - A1c still in process, FLP LDL 66 -UDS negative -Aspirin-Lipitor   Breast cancer: S/p of left lumpectomy.  Patient is doing chemotherapy currently CT scan shows fluid collection outer side of left breast.  Commend to follow-up with oncologist and general surgery as an outpatient -Follow-up with Dr. Barry Dienes  Leukopenia and neutropenia WBC 0.9, ANC 0.2-->0.0 Most likely due to chemotherapy Continue to monitor CBC with differential, may need to get an opinion from oncologist if patient spikes fever or persistently low ANC 7/16 d/w Dr. Janyth Contes, who mentioned that patient received Neulasta on 7/11   HTN (hypertension) -IV hydralazine as needed -Cozaar   HLD (hyperlipidemia) -Lipitor   OSA -on CPAP   Obesity, Class III, BMI 40-49.9 (morbid obesity) (HCC): Body weight was 24.3 kg, BMI 48.54 -Encourage to losing weight -Exercise and healthy diet     Thrombocytopenia: Platelet 70 (149 on 11/28/2022), likely due to chemotherapy Platelet count 70-50, trending down LDH 202 and peripheral smear platelet morphology unremarkable Monitor CBC daily    Body mass index is 48.54 kg/m.  Interventions:  Diet: Low-fat diet DVT Prophylaxis: SCD, pharmacological prophylaxis contraindicated due to thrombocytopenia    Advance goals of care  discussion: Full  code  Family Communication: family was not present at bedside, at the time of interview.  The pt provided permission to discuss medical plan with the family. Opportunity was given to ask question and all questions were answered satisfactorily.   Disposition:  Pt is from Home, admitted with Abd pain, UTI, still has abdominal pain, which precludes a safe discharge. Discharge to Home, when stable most likely tomorrow a.m.  Subjective: No significant events overnight, patient still has upper abdominal pain after drinking water but no difference with the food.  Patient is complaining of dysphagia, feels that something is sticking in her throat. SLP eval for MBS consulted.  We will continue to monitor today and plan for discharge tomorrow a.m.   Physical Exam: General: NAD, lying comfortably Appear in no distress, affect appropriate Eyes: PERRLA ENT: Oral Mucosa Clear, moist  Neck: no JVD,  Cardiovascular: S1 and S2 Present, no Murmur,  Respiratory: good respiratory effort, Bilateral Air entry equal and Decreased, no Crackles, no wheezes Abdomen: Bowel Sound present, Soft and mild upper abdominal tenderness,  Skin: no rashes Extremities: no Pedal edema, no calf tenderness Neurologic: without any new focal findings Gait not checked due to patient safety concerns  Vitals:   12/04/22 2040 12/05/22 0433 12/05/22 0531 12/05/22 1051  BP: (!) 143/63 120/79  124/62  Pulse: 84 74 81 82  Resp: 18 18  20   Temp: 98.1 F (36.7 C) 97.9 F (36.6 C)  98.2 F (36.8 C)  TempSrc:    Oral  SpO2: 98% 90% 98% 96%  Weight:      Height:        Intake/Output Summary (Last 24 hours) at 12/05/2022 1526 Last data filed at 12/05/2022 1453 Gross per 24 hour  Intake 438.68 ml  Output --  Net 438.68 ml   Filed Weights   12/02/22 2200  Weight: 124.3 kg    Data Reviewed: I have personally reviewed and interpreted daily labs, tele strips, imagings as discussed above. I reviewed all nursing notes,  pharmacy notes, vitals, pertinent old records I have discussed plan of care as described above with RN and patient/family.  CBC: Recent Labs  Lab 12/02/22 2207 12/04/22 0519 12/04/22 0523 12/05/22 0640  WBC 5.1 1.1* 0.9* 0.9*  NEUTROABS 3.7 0.2*  --  0.0*  HGB 12.4 11.4* 11.3* 10.7*  HCT 36.9 32.5* 33.6* 30.9*  MCV 84.6 82.7 85.3 84.7  PLT 70* 53* 51* 52*   Basic Metabolic Panel: Recent Labs  Lab 12/02/22 2207 12/03/22 0334 12/04/22 0523 12/05/22 0640  NA 136  --  138 137  K 3.2*  --  3.5 3.4*  CL 101  --  104 99  CO2 24  --  27 29  GLUCOSE 170*  --  122* 109*  BUN 11  --  9 10  CREATININE 0.87  --  0.75 0.85  CALCIUM 8.9  --  9.0 9.0  MG  --  1.8  --  2.0  PHOS  --  2.8  --  3.3    Studies: NM Hepato W/EF  Result Date: 12/05/2022 CLINICAL DATA:  Possible cholecystitis. EXAM: NUCLEAR MEDICINE HEPATOBILIARY IMAGING WITH GALLBLADDER EF TECHNIQUE: Sequential images of the abdomen were obtained out to 60 minutes following intravenous administration of radiopharmaceutical. After oral ingestion of Ensure, gallbladder ejection fraction was determined. At 60 min, normal ejection fraction is greater than 33%. RADIOPHARMACEUTICALS:  5.35 mCi Tc-25m  Choletec IV COMPARISON:  December 03, 2022. FINDINGS: Prompt uptake and biliary excretion  of activity by the liver is seen. Gallbladder activity is visualized, consistent with patency of cystic duct. Biliary activity passes into small bowel, consistent with patent common bile duct. Calculated gallbladder ejection fraction is 62%. Patient reported no symptoms with Ensure ingestion. (Normal gallbladder ejection fraction with Ensure is greater than 33%.) IMPRESSION: Normal gallbladder ejection fraction after Ensure administration. Electronically Signed   By: Lupita Raider M.D.   On: 12/05/2022 14:28    Scheduled Meds:  aspirin EC  81 mg Oral Daily   atorvastatin  20 mg Oral QHS   Chlorhexidine Gluconate Cloth  6 each Topical Daily   feeding  supplement  237 mL Oral TID BM   gabapentin  300 mg Oral TID   hydrOXYzine  25 mg Oral QHS   losartan  50 mg Oral Daily   multivitamin with minerals  1 tablet Oral Daily   pantoprazole  40 mg Oral Daily   phenazopyridine  100 mg Oral TID WC   polyethylene glycol  17 g Oral Daily   sertraline  50 mg Oral Daily   Continuous Infusions:  cefTRIAXone (ROCEPHIN)  IV Stopped (12/05/22 0707)   PRN Meds: acetaminophen, albuterol, hydrALAZINE, lip balm, ondansetron (ZOFRAN) IV  Time spent: 35 minutes  Author: Gillis Santa. MD [facility] 12/05/2022 3:26 PM  To reach On-call, see care teams to locate the attending and reach out to them via [email or web address]. If 7PM-7AM, please contact night-coverage If you still have difficulty reaching the attending provider, please page the [facility] (Director on Call) for [facility] on amion for assistance.

## 2022-12-06 ENCOUNTER — Inpatient Hospital Stay: Payer: 59

## 2022-12-06 DIAGNOSIS — N390 Urinary tract infection, site not specified: Principal | ICD-10-CM

## 2022-12-06 LAB — LIPID PANEL
Cholesterol: 119 mg/dL (ref 0–200)
HDL: 38 mg/dL — ABNORMAL LOW (ref >40)
LDL Cholesterol: 66 mg/dL (ref 0–99)
Total CHOL/HDL Ratio: 3.1 RATIO
Triglycerides: 73 mg/dL (ref <150)
VLDL: 15 mg/dL (ref 0–40)

## 2022-12-06 LAB — CBC WITH DIFFERENTIAL/PLATELET
Abs Immature Granulocytes: 0.03 10*3/uL (ref 0.00–0.07)
Basophils Absolute: 0 10*3/uL (ref 0.0–0.1)
Basophils Relative: 1 %
Eosinophils Absolute: 0 10*3/uL (ref 0.0–0.5)
Eosinophils Relative: 0 %
HCT: 33.6 % — ABNORMAL LOW (ref 36.0–46.0)
Hemoglobin: 11.7 g/dL — ABNORMAL LOW (ref 12.0–15.0)
Immature Granulocytes: 3 %
Lymphocytes Relative: 66 %
Lymphs Abs: 0.7 10*3/uL (ref 0.7–4.0)
MCH: 29.2 pg (ref 26.0–34.0)
MCHC: 34.8 g/dL (ref 30.0–36.0)
MCV: 83.8 fL (ref 80.0–100.0)
Monocytes Absolute: 0.3 10*3/uL (ref 0.1–1.0)
Monocytes Relative: 27 %
Neutro Abs: 0 10*3/uL — CL (ref 1.7–7.7)
Neutrophils Relative %: 3 %
Platelets: 86 10*3/uL — ABNORMAL LOW (ref 150–400)
RBC: 4.01 MIL/uL (ref 3.87–5.11)
RDW: 12.8 % (ref 11.5–15.5)
Smear Review: NORMAL
WBC: 1 10*3/uL — CL (ref 4.0–10.5)
nRBC: 0 % (ref 0.0–0.2)

## 2022-12-06 LAB — PATHOLOGIST SMEAR REVIEW

## 2022-12-06 LAB — GLUCOSE, CAPILLARY
Glucose-Capillary: 112 mg/dL — ABNORMAL HIGH (ref 70–99)
Glucose-Capillary: 233 mg/dL — ABNORMAL HIGH (ref 70–99)

## 2022-12-06 MED ORDER — HEPARIN SOD (PORK) LOCK FLUSH 100 UNIT/ML IV SOLN
500.0000 [IU] | Freq: Once | INTRAVENOUS | Status: AC
  Administered 2022-12-06: 500 [IU] via INTRAVENOUS
  Filled 2022-12-06: qty 5

## 2022-12-06 MED ORDER — NYSTATIN 100000 UNIT/ML MT SUSP: 5.0000 mL | Freq: Four times a day (QID) | OROMUCOSAL | 0 refills | Status: AC

## 2022-12-06 MED ORDER — CIPROFLOXACIN HCL 500 MG PO TABS: 500.0000 mg | ORAL_TABLET | Freq: Two times a day (BID) | ORAL | 0 refills | Status: AC

## 2022-12-06 MED ORDER — POLYETHYLENE GLYCOL 3350 17 G PO PACK: 17.0000 g | PACK | Freq: Every day | ORAL | 0 refills | Status: DC

## 2022-12-06 MED ORDER — BISACODYL 5 MG PO TBEC: DELAYED_RELEASE_TABLET | ORAL | 0 refills | Status: AC

## 2022-12-06 MED ORDER — PREDNISONE 20 MG PO TABS: 40.0000 mg | ORAL_TABLET | Freq: Every day | ORAL | 0 refills | Status: AC

## 2022-12-06 MED ORDER — PANTOPRAZOLE SODIUM 40 MG PO TBEC: 40.0000 mg | DELAYED_RELEASE_TABLET | Freq: Every day | ORAL | 0 refills | Status: DC

## 2022-12-06 NOTE — Discharge Summary (Signed)
[facility] Discharge Summary   Patient: Leslie Bautista ZOX:[id number]  PCP: [facility]  Date of admission: 12/03/2022   Date of discharge:  12/06/2022     Discharge Diagnoses:  Principal Problem:   Lower urinary tract infectious disease Active Problems:   Myocardial injury   Breast cancer (HCC)   HTN (hypertension)   HLD (hyperlipidemia)   OSA on CPAP   Obesity, Class III, BMI 40-49.9 (morbid obesity) (HCC)   Chemotherapy-induced neutropenia (HCC)   Thrombocytopenia (HCC)   Admitted From: Home Disposition:  Home   Recommendations for Outpatient Follow-up:  Follow-up with PCP in 1 week, repeat CBC with differential in 1 week Follow with oncologist and repeat CBC with differential in 1 week due to neutropenia, started Cipro 500 twice daily for 5 days for prophylaxis. Follow up LABS/TEST:  CBC w/diff   Diet recommendation: Regular diet  Activity: The patient is advised to gradually reintroduce usual activities, as tolerated  Discharge Condition: stable  Code Status: Full code   History of present illness: As per the H and P dictated on admission Hospital Course:  Leslie Bautista is a 62 y.o. female with medical history significant of breast cancer (s/p of lumpectomy, on chemotherapy currently), hypertension, hyperlipidemia, prediabetes, obesity, OSA on CPAP, who presents with increased urinary frequency, nausea. As per patient she has increased urinary frequency for past several days. No any other urinary complaints any other urinary complaints.  Currently patient is on chemotherapy.  Patient is also complaining of abdominal pain whenever she eats or drinks.  No any other active issues ED w/up: Vital stable, CBC shows thrombocytopenia platelet 70k, hypokalemia potassium 3.2, glucose 170 elevated total bilirubin 1.7 elevated, troponin 34 slightly elevated UA positive for nitrites, WBC >50 and many bacteria CT abdomen/pelvis: 1. Cystitis versus  bladder nondistention. 2. Cholelithiasis without evidence of acute cholecystitis. 3. Mild hepatic steatosis. 4. Uncomplicated sigmoid diverticulosis. 5. Aortic and coronary artery atherosclerosis. 6. 3.2 x 3.1 cm homogeneous fluid collection in the outer left breast, most likely either a seroma, breast cyst or liquified hematoma. Presumably the patient has outside breast imaging more recent than the most recent mammograms in our system from August 2015. Correlation with outside studies recommended for possible significance of this. Aortic Atherosclerosis (ICD10-I70.0).   Assessment and Plan: # UTI (urinary tract infection): Patient is not septic clinically.  Hemodynamically stable S/p IV Rocephin. -urine culture multiple species, suggested recollection. Continued antibiotics for now due to neutropenia.  Patient remained afebrile and asymptomatic. # Upper abdominal pain and complaining of dysphagia Patient is complaining of pain after drinking liquids/water, no abdominal pain with food but patient does have esophageal dysphagia. SLP eval done, no esophageal dysphagia.  Patient has chronic cervical problem and need to follow with the spine surgery as an outpatient.  Barium swallow to rule out any esophageal stricture.  Patient was started on prednisone 40 mg p.o. daily, pentad 10 mg p.o. 3 times daily and pantoprazole 40 mg p.o. twice daily.  Today patient is feeling improvement was advised to continue prednisone 40 mg p.o. daily for 3 days and continue PPI.  Discontinued Bentyl, it can cause constipation.  Started on laxatives.  Recommend to follow with PCP for further management as an outpatient.  # Elevated troponin level 20, 34, 32, most likely demand ischemia, no chest pain. A1c still in process, FLP LDL 66, UDS negative.  Continue Aspirin and Lipitor # Breast cancer: S/p of left lumpectomy.  Patient is doing chemotherapy currently CT scan shows  fluid collection outer side of left breast.  Commend  to follow-up with oncologist and general surgery as an outpatient. -Follow-up with Dr. Barry Dienes # Leukopenia and neutropenia WBC 1.0, ANC 0.2-->0.0, due to chemotherapy.  Patient received Neulasta on 7/11 as per Dr. Janyth Contes, so no need to repeat dose.  Commended to discharge patient on ciprofloxacin 500 mg p.o. twice daily for 5 days and repeat CBC in 1 week and follow-up with her own oncologist at [facility].  # HTN (hypertension) resumed losartan # HLD (hyperlipidemia) Lipitor # OSA-on CPAP # Thrombocytopenia: Platelet 70 (149 on 11/28/2022), likely due to chemotherapy Platelet count 70-50--86 improving. LDH 202 and peripheral smear platelet morphology unremarkable.  Repeat CBC next week and follow-up with oncologist. # Obesity,  Body mass index is 48.54 kg/m.  Nutrition Problem: Increased nutrient needs Etiology: cancer and cancer related treatments Interventions: Ensure Enlive (each supplement provides 350kcal and 20 grams of protein), MVI, Liberalize Diet Encourage to losing weight, Exercise and healthy diet  - Patient was instructed, not to drive, operate heavy machinery, perform activities at heights, swimming or participation in water activities or provide baby sitting services while on Pain, Sleep and Anxiety Medications; until her outpatient Physician has advised to do so again.  - Also recommended to not to take more than prescribed Pain, Sleep and Anxiety Medications.  Patient was ambulatory without any assistance. On the day of the discharge the patient's vitals were stable, and no other acute medical condition were reported by patient. the patient was felt safe to be discharge at Home.  Consultants: Oncologist Procedures: None  Discharge Exam: General: Appear in no distress, no Rash; Oral Mucosa Clear, moist. Cardiovascular: S1 and S2 Present, no Murmur, Respiratory: normal respiratory effort, Bilateral Air entry present and no Crackles, no wheezes Abdomen: Bowel Sound present, Soft and  no tenderness, no hernia Extremities: no Pedal edema, no calf tenderness Neurology: alert and oriented to time, place, and person affect appropriate.  Filed Weights   12/02/22 2200  Weight: 124.3 kg   Vitals:   12/06/22 0818 12/06/22 1157  BP: 122/61 (!) 150/80  Pulse: 66 72  Resp: 14 18  Temp: 98.4 F (36.9 C) 97.9 F (36.6 C)  SpO2: 99% 93%    DISCHARGE MEDICATION: Allergies as of 12/06/2022       Reactions   Oxycontin [oxycodone Hcl] Rash        Medication List     STOP taking these medications    meloxicam 15 MG tablet Commonly known as: Mobic   traZODone 100 MG tablet Commonly known as: DESYREL       TAKE these medications    albuterol 108 (90 Base) MCG/ACT inhaler Commonly known as: VENTOLIN HFA Inhale 2 puffs into the lungs every 6 (six) hours as needed for wheezing or shortness of breath.   atorvastatin 20 MG tablet Commonly known as: LIPITOR Take 20 mg by mouth at bedtime.   bisacodyl 5 MG EC tablet Commonly known as: bisacodyl Take 2 tablets (10 mg total) by mouth at bedtime for 3 days, THEN 2 tablets (10 mg total) daily as needed for up to 14 days for moderate constipation. Start taking on: December 06, 2022   ciprofloxacin 500 MG tablet Commonly known as: CIPRO Take 1 tablet (500 mg total) by mouth 2 (two) times daily for 5 days.   gabapentin 300 MG capsule Commonly known as: NEURONTIN Take 900 mg by mouth at bedtime.   hydrOXYzine 25 MG tablet Commonly known as: ATARAX Take 25  mg by mouth at bedtime.   losartan 50 MG tablet Commonly known as: COZAAR Take 0.5 tablets (25 mg total) by mouth daily. What changed: how much to take   nystatin 100000 UNIT/ML suspension Commonly known as: MYCOSTATIN Use as directed 5 mLs (500,000 Units total) in the mouth or throat 4 (four) times daily for 10 days.   ondansetron 8 MG tablet Commonly known as: ZOFRAN Take 8 mg by mouth every 8 (eight) hours as needed for nausea or vomiting.    pantoprazole 40 MG tablet Commonly known as: PROTONIX Take 1 tablet (40 mg total) by mouth daily for 7 days.   polyethylene glycol 17 g packet Commonly known as: MIRALAX / GLYCOLAX Take 17 g by mouth daily. Start taking on: December 07, 2022   predniSONE 20 MG tablet Commonly known as: DELTASONE Take 2 tablets (40 mg total) by mouth daily with breakfast for 3 days. Start taking on: December 07, 2022   prochlorperazine 10 MG tablet Commonly known as: COMPAZINE Take 10 mg by mouth every 6 (six) hours as needed for nausea.   sertraline 50 MG tablet Commonly known as: ZOLOFT Take 50 mg by mouth daily.       Allergies  Allergen Reactions   Oxycontin [Oxycodone Hcl] Rash   Discharge Instructions     Call MD for:  difficulty breathing, headache or visual disturbances   Complete by: As directed    Call MD for:  extreme fatigue   Complete by: As directed    Call MD for:  persistant dizziness or light-headedness   Complete by: As directed    Call MD for:  persistant nausea and vomiting   Complete by: As directed    Call MD for:  severe uncontrolled pain   Complete by: As directed    Call MD for:  temperature >100.4   Complete by: As directed    Diet - low sodium heart healthy   Complete by: As directed    Discharge instructions   Complete by: As directed    Follow-up with PCP in 1 week, repeat CBC with differential in 1 week Follow with oncologist and repeat CBC with differential in 1 week due to neutropenia, started Cipro 500 twice daily for 5 days for prophylaxis.   Increase activity slowly   Complete by: As directed        The results of significant diagnostics from this hospitalization (including imaging, microbiology, ancillary and laboratory) are listed below for reference.    Significant Diagnostic Studies: DG ESOPHAGUS W DOUBLE CM (HD)  Result Date: 12/06/2022 CLINICAL DATA:  Dysphagia EXAM: ESOPHOGRAM / BARIUM SWALLOW / BARIUM TABLET STUDY TECHNIQUE: Combined  double contrast and single contrast examination performed using effervescent crystals, thick barium liquid, and thin barium liquid. The patient was observed with fluoroscopy swallowing a 13 mm barium sulphate tablet. FLUOROSCOPY: Radiation Exposure Index (as provided by the fluoroscopic device): 7.7 mGy COMPARISON:  None Available. FINDINGS: Normal pharyngeal anatomy and motility. Contrast flowed freely through the esophagus without evidence of a stricture or mass. Normal esophageal mucosa without evidence of irregularity or ulceration. Esophageal motility was normal. No gastroesophageal reflux. Small transient hiatal hernia. At the end of the examination a 13 mm barium tablet was administered which transited through the esophagus and esophagogastric junction without delay. IMPRESSION: 1. No gastroesophageal reflux.  No esophageal stricture. Electronically Signed   By: Elige Ko M.D.   On: 12/06/2022 10:16   MR BRAIN WO CONTRAST  Result Date: 12/06/2022 CLINICAL DATA:  Acute neurologic deficit EXAM: MRI HEAD WITHOUT CONTRAST TECHNIQUE: Multiplanar, multiecho pulse sequences of the brain and surrounding structures were obtained without intravenous contrast. COMPARISON:  None Available. FINDINGS: Brain: No acute infarct, mass effect or extra-axial collection. No acute or chronic hemorrhage. Normal white matter signal, parenchymal volume and CSF spaces. The midline structures are normal. Vascular: Major flow voids are preserved. Skull and upper cervical spine: Normal calvarium and skull base. Visualized upper cervical spine and soft tissues are normal. Sinuses/Orbits:No paranasal sinus fluid levels or advanced mucosal thickening. No mastoid or middle ear effusion. Normal orbits. IMPRESSION: Normal brain MRI. Electronically Signed   By: Deatra Robinson M.D.   On: 12/06/2022 00:07   NM Hepato W/EF  Result Date: 12/05/2022 CLINICAL DATA:  Possible cholecystitis. EXAM: NUCLEAR MEDICINE HEPATOBILIARY IMAGING WITH  GALLBLADDER EF TECHNIQUE: Sequential images of the abdomen were obtained out to 60 minutes following intravenous administration of radiopharmaceutical. After oral ingestion of Ensure, gallbladder ejection fraction was determined. At 60 min, normal ejection fraction is greater than 33%. RADIOPHARMACEUTICALS:  5.35 mCi Tc-6m  Choletec IV COMPARISON:  December 03, 2022. FINDINGS: Prompt uptake and biliary excretion of activity by the liver is seen. Gallbladder activity is visualized, consistent with patency of cystic duct. Biliary activity passes into small bowel, consistent with patent common bile duct. Calculated gallbladder ejection fraction is 62%. Patient reported no symptoms with Ensure ingestion. (Normal gallbladder ejection fraction with Ensure is greater than 33%.) IMPRESSION: Normal gallbladder ejection fraction after Ensure administration. Electronically Signed   By: Lupita Raider M.D.   On: 12/05/2022 14:28   CT ABDOMEN PELVIS W CONTRAST  Result Date: 12/03/2022 CLINICAL DATA:  Abdominal pain, nausea, and vomiting. Breast cancer patient on chemotherapy, most recent infusion 5 days ago. EXAM: CT ABDOMEN AND PELVIS WITH CONTRAST TECHNIQUE: Multidetector CT imaging of the abdomen and pelvis was performed using the standard protocol following bolus administration of intravenous contrast. RADIATION DOSE REDUCTION: This exam was performed according to the departmental dose-optimization program which includes automated exposure control, adjustment of the mA and/or kV according to patient size and/or use of iterative reconstruction technique. CONTRAST:  OMNIPAQUE IOHEXOL 300 MG/ML  SOLN COMPARISON:  CT with IV and oral contrast 10/20/2015 FINDINGS: Lower chest: There is mild atelectasis posteriorly, mild elevation right hemidiaphragm. Lung bases are clear of infiltrates. There is moderate calcification in the inferior mitral ring. There is an infusion catheter terminating in the right atrium. There are  coronary artery calcifications, slight cardiomegaly. In the outer left breast there is a homogeneous fluid collection measuring 3.2 x 3.1 cm with a Hounsfield density of 15, most likely either a seroma, breast cyst or liquified hematoma and not fully imaged. No wall complexity is seen in the visualized portion. There are coarse scar-like markings adjacent to this. Presumably the patient has outside breast imaging more recent than the most recent mammograms in our system from August 2015. Correlation with outside studies recommended for possible significance of this. Hepatobiliary: The liver is mildly steatotic without mass enhancement. There are multiple rounded noncalcified stones in the gallbladder, largest is 1.2 cm. No wall thickening or bile duct dilatation is seen. Pancreas: Mild fatty replacement in the head and neck portions and uncinate process. No mass enhancement or acute abnormality. Spleen: No abnormality. Adrenals/Urinary Tract: There is no adrenal mass. Posteriorly in the right kidney there is a 1.3 cm homogeneous cyst with a Hounsfield density of 0.8, consistent with a Bosniak 1 cyst requiring no follow-up. There is a 6  mm hypodensity in the outer midpole right kidney which is too small to characterize but consistent with a Bosniak 2 cyst. No follow-up imaging is recommended. The remainder of the kidneys enhance homogeneously. There is symmetric excretion in the delayed phase. There is no urinary stone or obstruction. There is either mild thickening of the bladder wall or exaggeration from underdistention. Stomach/Bowel: No dilatation or wall thickening. Uncomplicated sigmoid diverticulosis. An appendix is not seen in this patient. Vascular/Lymphatic: Aortic atherosclerosis. No enlarged abdominal or pelvic lymph nodes. Reproductive: Uterus and bilateral adnexa are unremarkable. Multiple pelvic phleboliths. Other: No abdominal wall hernia or abnormality. No abdominopelvic ascites. Musculoskeletal:  Extensive bridging enthesopathy of the lower thoracic spine. Advanced facet hypertrophy lumbar spine with acquired spinal stenosis L3-4 and L4-5. Severe acquired foraminal stenosis lowest 3 lumbar levels. No destructive bone lesions are seen. IMPRESSION: 1. Cystitis versus bladder nondistention. 2. Cholelithiasis without evidence of acute cholecystitis. 3. Mild hepatic steatosis. 4. Uncomplicated sigmoid diverticulosis. 5. Aortic and coronary artery atherosclerosis. 6. 3.2 x 3.1 cm homogeneous fluid collection in the outer left breast, most likely either a seroma, breast cyst or liquified hematoma. Presumably the patient has outside breast imaging more recent than the most recent mammograms in our system from August 2015. Correlation with outside studies recommended for possible significance of this. Aortic Atherosclerosis (ICD10-I70.0). Electronically Signed   By: Almira Bar M.D.   On: 12/03/2022 03:23    Microbiology: Recent Results (from the past 240 hour(s))  Urine Culture     Status: Abnormal   Collection Time: 12/02/22 10:09 PM   Specimen: Urine, Clean Catch  Result Value Ref Range Status   Specimen Description   Final    URINE, CLEAN CATCH Performed at [facility], [address], [place], [place] [address]    Special Requests   Final    NONE Performed at [facility], [address] [place], [place] [address]    Culture MULTIPLE SPECIES PRESENT, SUGGEST RECOLLECTION (A)  Final   Report Status 12/04/2022 FINAL  Final     Labs: CBC: Recent Labs  Lab 12/02/22 2207 12/04/22 0519 12/04/22 0523 12/05/22 0640 12/06/22 0325  WBC 5.1 1.1* 0.9* 0.9* 1.0*  NEUTROABS 3.7 0.2*  --  0.0* 0.0*  HGB 12.4 11.4* 11.3* 10.7* 11.7*  HCT 36.9 32.5* 33.6* 30.9* 33.6*  MCV 84.6 82.7 85.3 84.7 83.8  PLT 70* 53* 51* 52* 86*   Basic Metabolic Panel: Recent Labs  Lab 12/02/22 2207 12/03/22 0334 12/04/22 0523 12/05/22 0640  NA 136  --  138 137  K 3.2*  --  3.5 3.4*   CL 101  --  104 99  CO2 24  --  27 29  GLUCOSE 170*  --  122* 109*  BUN 11  --  9 10  CREATININE 0.87  --  0.75 0.85  CALCIUM 8.9  --  9.0 9.0  MG  --  1.8  --  2.0  PHOS  --  2.8  --  3.3   Liver Function Tests: Recent Labs  Lab 12/02/22 2207  AST 30  ALT 29  ALKPHOS 97  BILITOT 1.7*  PROT 7.6  ALBUMIN 3.9   Recent Labs  Lab 12/02/22 2207  LIPASE 25   No results for input(s): "AMMONIA" in the last 168 hours. Cardiac Enzymes: No results for input(s): "CKTOTAL", "CKMB", "CKMBINDEX", "TROPONINI" in the last 168 hours. BNP (last 3 results) No results for input(s): "BNP" in the last 8760 hours. CBG: Recent Labs  Lab 12/03/22 2000 12/04/22 0838 12/05/22 2214 12/06/22 0819 12/06/22 1155  GLUCAP 125* 120* 153* 112* 233*    Time spent: 35 minutes  Signed:  Gillis Santa  [facility] 12/06/2022 12:31 PM

## 2022-12-06 NOTE — Progress Notes (Signed)
SLP Cancellation Note  Patient Details Name: Allizon Woznick MRN: [id number] DOB: 04/27/1961   Cancelled treatment:       Reason Eval/Treat Not Completed: SLP screened, no needs identified, will sign off (chart reviewed.) Pt denied any difficulty swallowing and is currently on a regular diet; tolerates swallowing pills w/ water per NSG. Pt conversed in general conversation w/out expressive/receptive deficits noted; pt denied any speech-language deficits. Speech clear. Pt has been communicating w/ other Staff this morning w/out deficits noted. No further skilled ST services indicated as pt appears at her baseline. Pt agreed. NSG to reconsult if any change in status while admitted.     Jerilynn Som, MS, CCC-SLP Speech Language Pathologist Rehab Services; [facility] [phone] (ascom) Gargi Berch 12/06/2022, 2:05 PM

## 2022-12-06 NOTE — Evaluation (Signed)
Occupational Therapy Evaluation Patient Details Name: Leslie Bautista MRN: [id number] DOB: [birth year] Today's Date: 12/06/2022   History of Present Illness a 62 y.o. female with medical history significant of breast cancer (s/p of lumpectomy, on chemotherapy currently), hypertension, hyperlipidemia, prediabetes, obesity, OSA on CPAP, who presents with increased urinary frequency, nausea. As per patient she has increased urinary frequency for past several days. No any other urinary complaints any other urinary complaints.  Currently patient is on chemotherapy.   Clinical Impression   Upon entering the room, pt seated on EOB and agreeable to OT intervention. Pt reports living at home with daughter at baseline and utilizing RW for mobility. Pt is independent in self care and shares IADL tasks with daughter in home. Pt has another daughter that is also visiting that will be at home to assist if she needs anything at discharge. Pt stands at ambulates in room with RW and demonstrates toilet transfers without difficulty. Pt reports she is at current baseline and agrees that she does not need further therapeutic intervention at this time. OT to complete order.      Recommendations for follow up therapy are one component of a multi-disciplinary discharge planning process, led by the attending physician.  Recommendations may be updated based on patient status, additional functional criteria and insurance authorization.   Assistance Recommended at Discharge None     Functional Status Assessment  Patient has not had a recent decline in their functional status  Equipment Recommendations  None recommended by OT       Precautions / Restrictions Precautions Precautions: Fall      Mobility Bed Mobility Overal bed mobility: Independent                  Transfers Overall transfer level: Modified independent Equipment used: Rolling walker (2 wheels)                      Balance  Overall balance assessment: Modified Independent                                         ADL either performed or assessed with clinical judgement   ADL Overall ADL's : Modified independent                                             Vision Patient Visual Report: No change from baseline              Pertinent Vitals/Pain Pain Assessment Pain Assessment: No/denies pain        Extremity/Trunk Assessment Upper Extremity Assessment Upper Extremity Assessment: Overall WFL for tasks assessed   Lower Extremity Assessment Lower Extremity Assessment: Overall WFL for tasks assessed       Communication Communication Communication: No difficulties   Cognition Arousal/Alertness: Awake/alert Behavior During Therapy: WFL for tasks assessed/performed Overall Cognitive Status: Within Functional Limits for tasks assessed                                                  Home Living Family/patient expects to be discharged to:: Private residence Living Arrangements: Spouse/significant other;Children Available Help at Discharge:  Family;Available 24 hours/day Type of Home: House Home Access: Stairs to enter [employer] of Steps: 4 Entrance Stairs-Rails: Right;Left;Can reach both Home Layout: One level     Bathroom Shower/Tub: Tub/shower unit         Home Equipment: Agricultural consultant (2 wheels);Cane - single point          Prior Functioning/Environment Prior Level of Function : Independent/Modified Independent                                 OT Goals(Current goals can be found in the care plan section) Acute Rehab OT Goals Patient Stated Goal: to go home OT Goal Formulation: With patient Time For Goal Achievement: 12/06/22 Potential to Achieve Goals: Good  OT Frequency:         AM-PAC OT "6 Clicks" Daily Activity     Outcome Measure Help from another person eating meals?: None Help from  another person taking care of personal grooming?: None Help from another person toileting, which includes using toliet, bedpan, or urinal?: None Help from another person bathing (including washing, rinsing, drying)?: None Help from another person to put on and taking off regular upper body clothing?: None Help from another person to put on and taking off regular lower body clothing?: None 6 Click Score: 24   End of Session Equipment Utilized During Treatment: Rolling walker (2 wheels) Nurse Communication: Mobility status  Activity Tolerance: Patient tolerated treatment well Patient left: in bed                   Time: 1610-9604 OT Time Calculation (min): 10 min Charges:  OT General Charges $OT Visit: 1 Visit OT Evaluation $OT Eval Low Complexity: 1 Low  Jackquline Denmark, MS, OTR/L , CBIS ascom [phone]  12/06/22, 11:44 AM

## 2022-12-06 NOTE — Progress Notes (Signed)
PT Cancellation Note  Patient Details Name: Lashena Signer MRN: [id number] DOB: 1960/07/05   Cancelled Treatment:    Reason Eval/Treat Not Completed: Other (comment) Per OT, pt returned to baseline, declined PT services. No PT needs, PT to sign off.   Lala Lund, PT, SPT  12:51 PM,12/06/22

## 2022-12-06 NOTE — Plan of Care (Signed)

## 2022-12-06 NOTE — Progress Notes (Signed)
PT Cancellation Note  Patient Details Name: Leslie Bautista MRN: [id number] DOB: August 29, 1960   Cancelled Treatment:    Reason Eval/Treat Not Completed: Other (comment). RN preparing pt for imaging, PT to re-attempt as able.   Olga Coaster PT, DPT 9:08 AM,12/06/22

## 2022-12-26 ENCOUNTER — Emergency Department
Admission: EM | Admit: 2022-12-26 | Discharge: 2022-12-26 | Disposition: A | Discharge status: Home / Self Care | Payer: 59 | Attending: Emergency Medicine | Admitting: Emergency Medicine

## 2022-12-26 ENCOUNTER — Other Ambulatory Visit: Payer: Self-pay

## 2022-12-26 ENCOUNTER — Emergency Department: Payer: 59

## 2022-12-26 DIAGNOSIS — D0512 Intraductal carcinoma in situ of left breast: Secondary | ICD-10-CM | POA: Diagnosis not present

## 2022-12-26 DIAGNOSIS — R42 Dizziness and giddiness: Secondary | ICD-10-CM | POA: Insufficient documentation

## 2022-12-26 LAB — BASIC METABOLIC PANEL
Anion gap: 8 (ref 5–15)
BUN: 13 mg/dL (ref 8–23)
CO2: 25 mmol/L (ref 22–32)
Calcium: 9.6 mg/dL (ref 8.9–10.3)
Chloride: 102 mmol/L (ref 98–111)
Creatinine, Ser: 1.04 mg/dL — ABNORMAL HIGH (ref 0.44–1.00)
GFR, Estimated: >60 mL/min (ref >60)
Glucose, Bld: 163 mg/dL — ABNORMAL HIGH (ref 70–99)
Potassium: 3.6 mmol/L (ref 3.5–5.1)
Sodium: 135 mmol/L (ref 135–145)

## 2022-12-26 LAB — CBC WITH DIFFERENTIAL/PLATELET
Abs Immature Granulocytes: 0.01 10*3/uL (ref 0.00–0.07)
Basophils Absolute: 0 10*3/uL (ref 0.0–0.1)
Basophils Relative: 2 %
Eosinophils Absolute: 0 10*3/uL (ref 0.0–0.5)
Eosinophils Relative: 3 %
HCT: 33.2 % — ABNORMAL LOW (ref 36.0–46.0)
Hemoglobin: 11.1 g/dL — ABNORMAL LOW (ref 12.0–15.0)
Immature Granulocytes: 1 %
Lymphocytes Relative: 66 %
Lymphs Abs: 0.7 10*3/uL (ref 0.7–4.0)
MCH: 28.9 pg (ref 26.0–34.0)
MCHC: 33.4 g/dL (ref 30.0–36.0)
MCV: 86.5 fL (ref 80.0–100.0)
Monocytes Absolute: 0.3 10*3/uL (ref 0.1–1.0)
Monocytes Relative: 22 %
Neutro Abs: 0.1 10*3/uL — CL (ref 1.7–7.7)
Neutrophils Relative %: 6 %
Platelets: 80 10*3/uL — ABNORMAL LOW (ref 150–400)
RBC: 3.84 MIL/uL — ABNORMAL LOW (ref 3.87–5.11)
RDW: 14.2 % (ref 11.5–15.5)
Smear Review: NORMAL
WBC: 1.1 10*3/uL — CL (ref 4.0–10.5)
nRBC: 0 % (ref 0.0–0.2)

## 2022-12-26 LAB — CBC
HCT: 32.3 % — ABNORMAL LOW (ref 36.0–46.0)
Hemoglobin: 10.9 g/dL — ABNORMAL LOW (ref 12.0–15.0)
MCH: 28.6 pg (ref 26.0–34.0)
MCHC: 33.7 g/dL (ref 30.0–36.0)
MCV: 84.8 fL (ref 80.0–100.0)
Platelets: 81 10*3/uL — ABNORMAL LOW (ref 150–400)
RBC: 3.81 MIL/uL — ABNORMAL LOW (ref 3.87–5.11)
RDW: 14.1 % (ref 11.5–15.5)
WBC: 1.2 10*3/uL — CL (ref 4.0–10.5)
nRBC: 0 % (ref 0.0–0.2)

## 2022-12-26 LAB — URINALYSIS, ROUTINE W REFLEX MICROSCOPIC
Bilirubin Urine: NEGATIVE
Glucose, UA: NEGATIVE mg/dL
Hgb urine dipstick: NEGATIVE
Ketones, ur: NEGATIVE mg/dL
Leukocytes,Ua: NEGATIVE
Nitrite: NEGATIVE
Protein, ur: NEGATIVE mg/dL
Specific Gravity, Urine: 1.003 — ABNORMAL LOW (ref 1.005–1.030)
pH: 6 (ref 5.0–8.0)

## 2022-12-26 MED ORDER — ONDANSETRON HCL 4 MG/2ML IJ SOLN
4.0000 mg | INTRAMUSCULAR | Status: AC
  Administered 2022-12-26: 4 mg via INTRAVENOUS
  Filled 2022-12-26: qty 2

## 2022-12-26 MED ORDER — LACTATED RINGERS IV BOLUS
1000.0000 mL | Freq: Once | INTRAVENOUS | Status: AC
  Administered 2022-12-26: 1000 mL via INTRAVENOUS

## 2022-12-26 NOTE — ED Triage Notes (Signed)
Pt presents to ED from [facility] clinic with c/o of hypotension and dizziness. Pt has HX of breast CA, pt currently on chemo at this time. Pt endorses dizziness as lightheaded. Pt is A&Ox4.

## 2022-12-26 NOTE — ED Notes (Signed)
Pt has been encouraged to give a urine sample. Pt states she can not yet.

## 2022-12-26 NOTE — ED Notes (Signed)
Reassess after IVF   Chesley Noon, MD 12/26/22 [phone]

## 2022-12-26 NOTE — ED Notes (Signed)
WBC 1.2 will inform MD Derrill Kay

## 2022-12-26 NOTE — ED Provider Notes (Signed)
[facility] Provider Note    Event Date/Time   First MD Initiated Contact with Patient 12/26/22 1444     (approximate)   History   Dizziness   HPI Leslie Bautista is a 62 y.o. female with a history of Invasive ductal carcinoma of left breast who is currently undergoing chemotherapy at [facility].  She presents for evaluation of feeling general malaise and fatigue ("shitty") and low blood pressure.  She went to her [facility] primary care office for an appointment today about her peripheral neuropathy.  They noted at triage that her blood pressure was low ("double digits over double digits") and sent her to the emergency department.  She said that she has had nausea but no vomiting.  Decreased oral intake.  She feels like her breathing is "funny" but she is not having trouble breathing.  No particular pain other than her neuropathy bothering her.  She takes infusions of chemotherapy every 21 days.  Her last infusion was a week ago.  She said that after the prior infusion she was admitted to this hospital for similar symptoms and she was found to have a UTI and needed fluids for dehydration.     Physical Exam   Triage Vital Signs: ED Triage Vitals [12/26/22 1401]  Encounter Vitals Group     BP (!) 124/100     Systolic BP Percentile      Diastolic BP Percentile      Pulse Rate 94     Resp 18     Temp 98.4 F (36.9 C)     Temp Source Oral     SpO2 100 %     Weight 122 kg (269 lb)     Height 1.6 m (5\' 3" )     Head Circumference      Peak Flow      Pain Score 0     Pain Loc      Pain Education      Exclude from Growth Chart     Most recent vital signs: Vitals:   12/26/22 1700 12/26/22 1800  BP: (!) 140/63 127/65  Pulse:    Resp:    Temp:    SpO2:      General: Awake, no obvious distress currently. CV:  Good peripheral perfusion.  Regular rate and rhythm.  Normal heart sounds. Resp:  Normal effort. Speaking easily and comfortably, no accessory  muscle usage nor intercostal retractions.  Lungs clear to auscultation.  No coarse breath sounds. Abd:  No distention.  Obese.  No tenderness to palpation.  No guarding. Other:  Patient has lost her hair as result of the chemotherapy but appears generally well despite her ongoing illness.  Nontoxic appearance currently.   ED Results / Procedures / Treatments   Labs (all labs ordered are listed, but only abnormal results are displayed) Labs Reviewed  BASIC METABOLIC PANEL - Abnormal; Notable for the following components:      Result Value   Glucose, Bld 163 (*)    Creatinine, Ser 1.04 (*)    All other components within normal limits  CBC - Abnormal; Notable for the following components:   WBC 1.2 (*)    RBC 3.81 (*)    Hemoglobin 10.9 (*)    HCT 32.3 (*)    Platelets 81 (*)    All other components within normal limits  URINALYSIS, ROUTINE W REFLEX MICROSCOPIC - Abnormal; Notable for the following components:   Color, Urine STRAW (*)  APPearance CLEAR (*)    Specific Gravity, Urine 1.003 (*)    All other components within normal limits  CBC WITH DIFFERENTIAL/PLATELET - Abnormal; Notable for the following components:   WBC 1.1 (*)    RBC 3.84 (*)    Hemoglobin 11.1 (*)    HCT 33.2 (*)    Platelets 80 (*)    Neutro Abs 0.1 (*)    All other components within normal limits     EKG  ED ECG REPORT I, Loleta Rose, the attending physician, personally viewed and interpreted this ECG.  Date: 12/26/2022 EKG Time: 14: 05 Rate: 91 Rhythm: normal sinus rhythm QRS Axis: normal Intervals: normal ST/T Wave abnormalities: normal Narrative Interpretation: no evidence of acute ischemia    RADIOLOGY CXR pending at time of transfer of care.   PROCEDURES:  Critical Care performed: No  .1-3 Lead EKG Interpretation  Performed by: Loleta Rose, MD Authorized by: Loleta Rose, MD     Interpretation: normal     ECG rate:  90   ECG rate assessment: normal     Rhythm: sinus  rhythm     Ectopy: none     Conduction: normal       IMPRESSION / MDM / ASSESSMENT AND PLAN / ED COURSE  I reviewed the triage vital signs and the nursing notes.                              Differential diagnosis includes, but is not limited to, dehydration/volume depletion, pneumonia, viral illness, UTI, bacteremia.  Patient's presentation is most consistent with acute presentation with potential threat to life or bodily function.  Labs/studies ordered: EKG, 1 view chest x-ray, CBC with differential, BMP, urinalysis  Interventions/Medications given:  Medications  lactated ringers bolus 1,000 mL (0 mLs Intravenous Stopped 12/26/22 1726)  ondansetron (ZOFRAN) injection 4 mg (4 mg Intravenous Given 12/26/22 1513)    (Note:  hospital course my include additional interventions and/or labs/studies not listed above.)   Patient does not feel well in general but she is a week out from her last chemotherapy treatment and is likely volume depleted.  Will provide 1 L LR IV bolus and continue assessment.  Thus far, her workup has been reassuring with no significant changes on EKG, BMP, and CBC with differential (patient has persistent leukocytosis, this time WBC 1.2.  Patient also feeling ill (nauseated) so I ordered Zofran 4 mg IV.  I ordered a 1 view chest x-ray since she is describing her breathing as "funny" and I wanted to look for and try eliminate possible sources of infection.  The patient is on the cardiac monitor to evaluate for evidence of arrhythmia and/or significant heart rate changes.  Transferring ED care to Dr. Larinda Buttery at 15:45 to follow up on CXR and reassess patient's status after IVF.      FINAL CLINICAL IMPRESSION(S) / ED DIAGNOSES   Final diagnoses:  Dizziness     Rx / DC Orders   ED Discharge Orders     None        Note:  This document was prepared using Dragon voice recognition software and may include unintentional dictation errors.   Loleta Rose,  MD 12/27/22 1330

## 2022-12-26 NOTE — ED Provider Notes (Signed)
-----------------------------------------   3:37 PM on 12/26/2022 -----------------------------------------  Blood pressure 123/67, pulse 94, temperature 98.4 F (36.9 C), temperature source Oral, resp. rate 18, height 5\' 3"  (1.6 m), weight 122 kg, last menstrual period 10/15/2019, SpO2 100%.  Assuming care from Dr. York Cerise.  In short, Leslie Bautista is a 62 y.o. female with a chief complaint of Dizziness .  Refer to the original H&P for additional details.  The current plan of care is to reassess following IVF.  ----------------------------------------- 6:22 PM on 12/26/2022 ----------------------------------------- Patient reports feeling much better following IV fluids, chest x-ray is unremarkable and urinalysis shows no signs of infection.  She is appropriate for discharge home, spoke with her oncologist while here in the ED and has an appointment scheduled for early next week.  She was counseled to return to the ED for new or worsening symptoms, patient agrees with plan.    Chesley Noon, MD 12/26/22 Rickey Primus

## 2023-02-09 ENCOUNTER — Other Ambulatory Visit: Payer: Medicare Other

## 2023-04-09 IMAGING — US US PELVIS COMPLETE WITH TRANSVAGINAL
2 series · 13 of 25 positions shown · non-contrast
Comparison: CT abdomen pelvis 10/20/2015.
COMPARISON: CT abdomen pelvis 10/20/2015.

Addendum:
CLINICAL DATA: Postmenopause bleeding

EXAM:
TRANSABDOMINAL AND TRANSVAGINAL ULTRASOUND OF PELVIS
DOPPLER ULTRASOUND OF OVARIES
TECHNIQUE: Both transabdominal and transvaginal ultrasound examinations of the
pelvis were performed. Transabdominal technique was performed for
global imaging of the pelvis including uterus, ovaries, adnexal
regions, and pelvic cul-de-sac.
It was necessary to proceed with endovaginal exam following the
transabdominal exam to visualize the endometrium. Color and duplex
Doppler ultrasound was utilized to evaluate blood flow to the
ovaries.

[Series 1: us pelvis complete with transvaginal · 0.20mm/px · 91 acquisitions, 12 frames shown (1 of 2)]
[im 1/91]
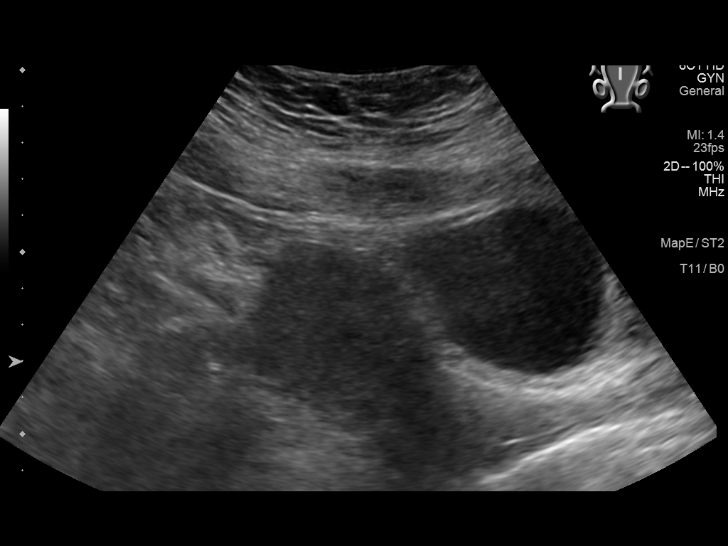
[im 8/91]
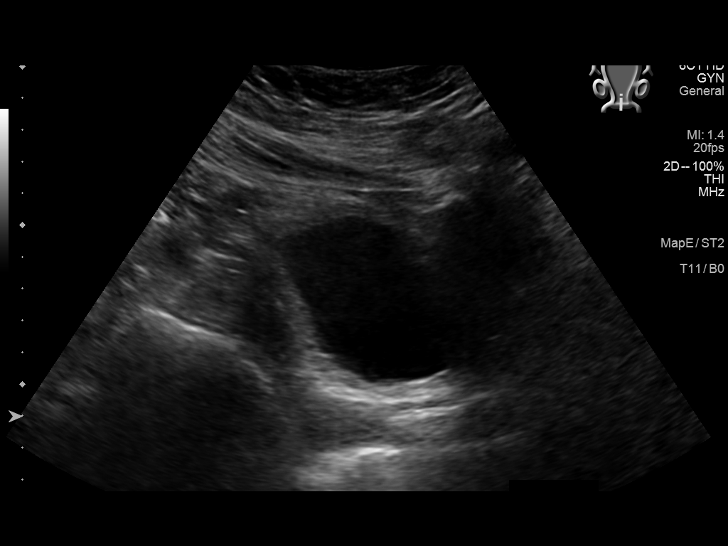
[im 16/91]
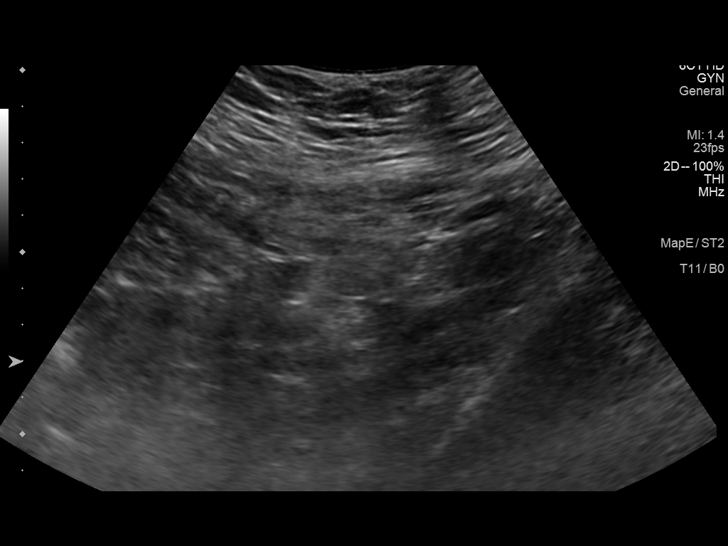
[im 24/91]
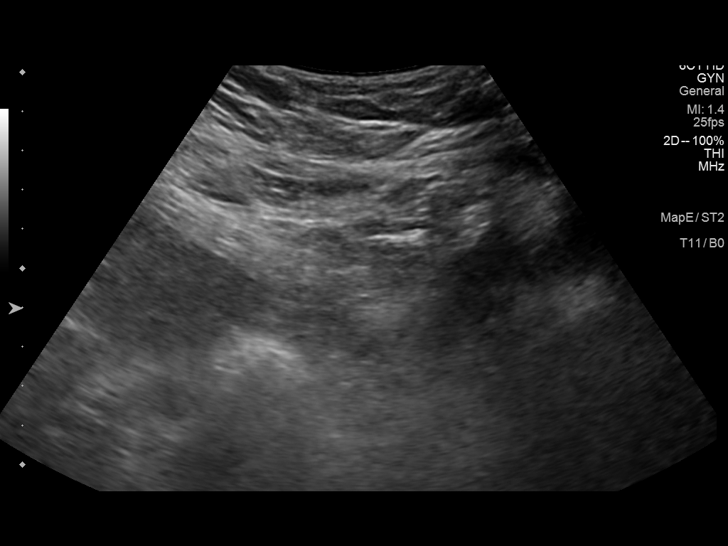
[im 32/91]
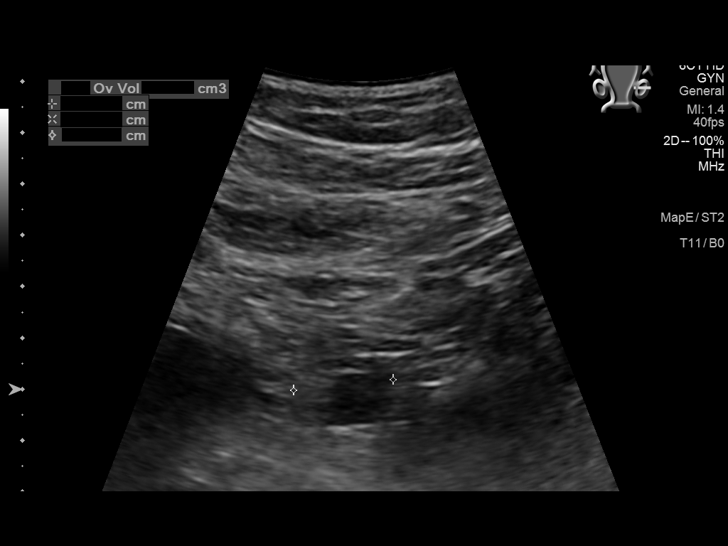
[im 40/91]
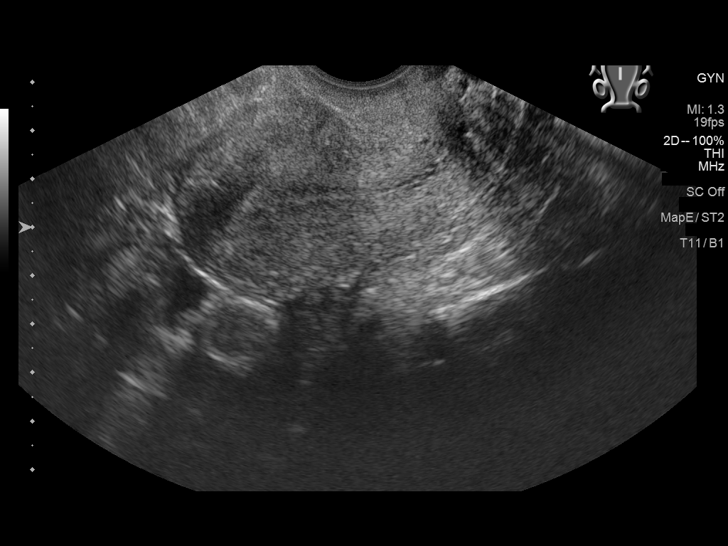
[im 47/91]
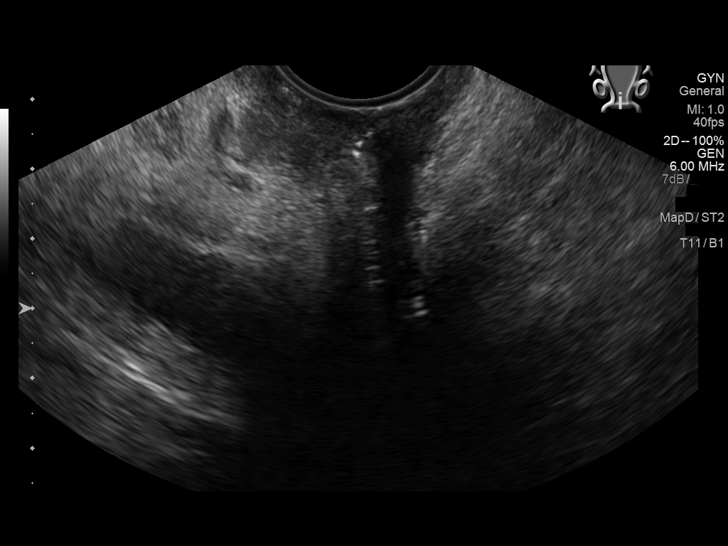
[im 55/91]
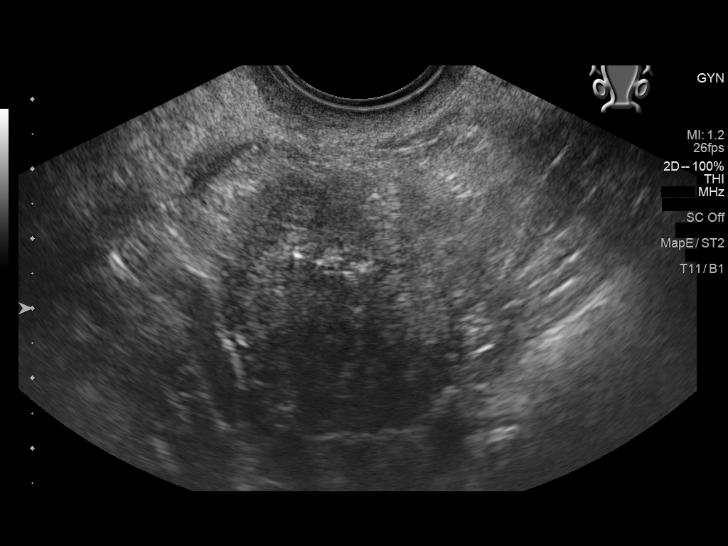
[im 63/91]
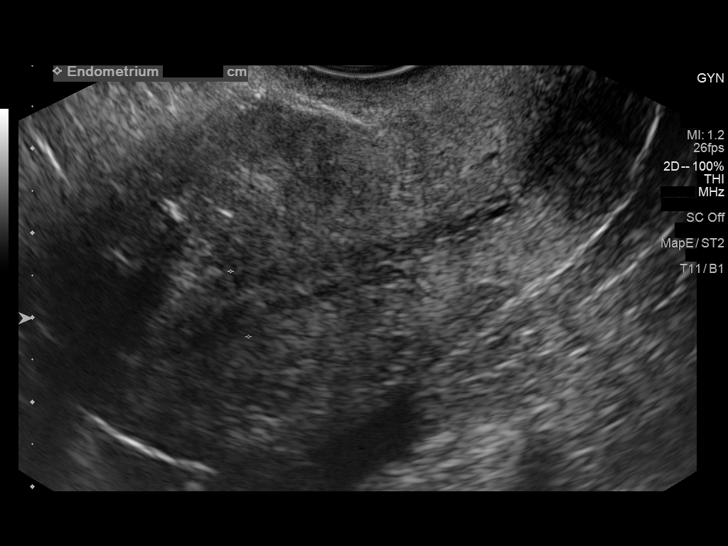
[im 71/91]
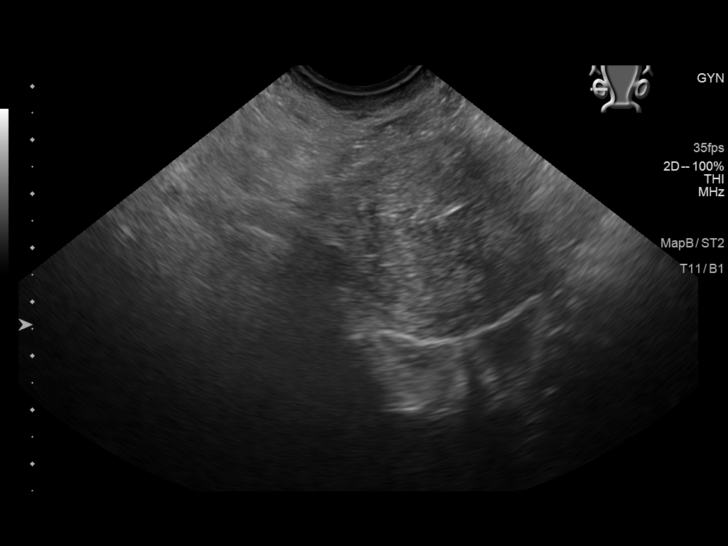
[im 79/91]
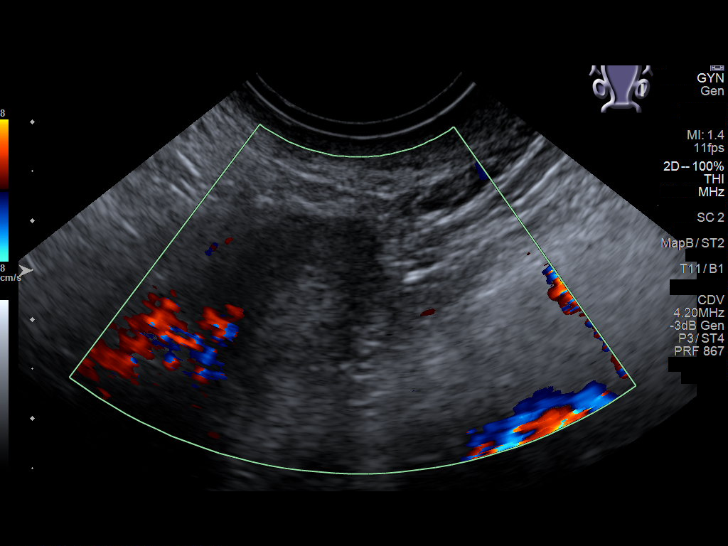
[im 87/91]
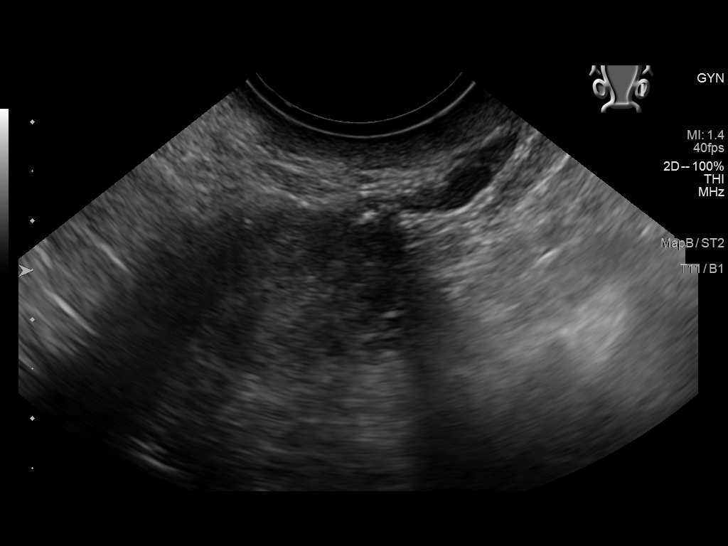

[Series 1001: us pelvis complete with transvaginal · 0.09mm/px · 1 of 1 slices shown (2 of 2)]
[im 1/1]
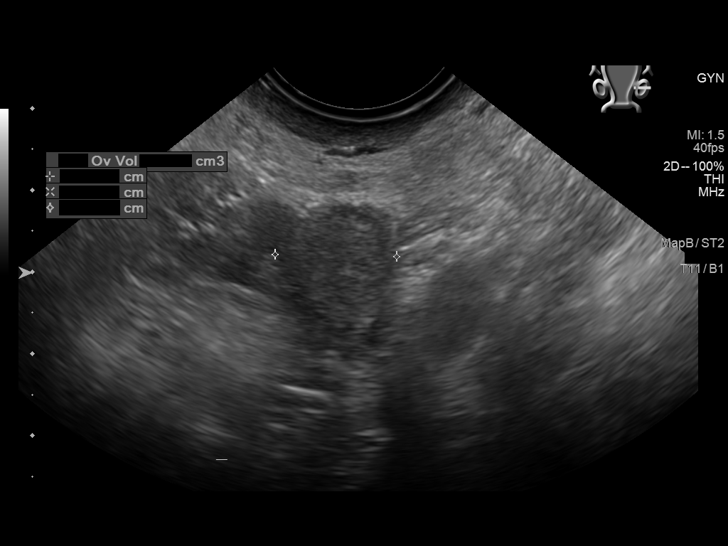

[13 of 25 positions shown; findings below may reference images not displayed]

FINDINGS: Uterus

Measurements: 8.1 x 2.3 x 4.3 cm = volume: 78 mL. No fibroids or
other mass visualized.

Endometrium

Thickness: 8mm.  No focal abnormality visualized.

Right ovary

Not visualized.

Left ovary

Measurements: 3.3 x 1.6 x 1.5 cm = volume: 3 mL. There is a simple
appearing 31.3 x 1.3 x 1 cm left ovarian cyst. Normal appearance/no
adnexal mass.

Pulsed Doppler evaluation of both ovaries demonstrates normal
low-resistance arterial and venous waveforms.

Other findings

No abnormal free fluid.
IMPRESSION: 1. Thickened endometrium in a postmenopausal patient. Underlying
malignancy not excluded. Recommend gynecologic consultation.
2. The right ovary is not visualized.

ADDENDUM:
Please note technique AND findings should NOT mention pulsed Doppler
evaluation of the ovaries as this was NOT performed. There is normal
COLOR Doppler flow to the left ovary. The right ovary is not
visualized.

Thank you.

These results were called by telephone at the time of interpretation
on 06/06/2021 at [DATE] to provider GAKPO FLOURISH , who verbally
acknowledged these results.

*** End of Addendum ***
FINDINGS: Uterus

Measurements: 8.1 x 2.3 x 4.3 cm = volume: 78 mL. No fibroids or
other mass visualized.

Endometrium

Thickness: 8mm.  No focal abnormality visualized.

Right ovary

Not visualized.

Left ovary

Measurements: 3.3 x 1.6 x 1.5 cm = volume: 3 mL. There is a simple
appearing 31.3 x 1.3 x 1 cm left ovarian cyst. Normal appearance/no
adnexal mass.

Pulsed Doppler evaluation of both ovaries demonstrates normal
low-resistance arterial and venous waveforms.

Other findings

No abnormal free fluid.
IMPRESSION: 1. Thickened endometrium in a postmenopausal patient. Underlying
malignancy not excluded. Recommend gynecologic consultation.
2. The right ovary is not visualized.

## 2023-05-10 ENCOUNTER — Ambulatory Visit: Payer: 59 | Admitting: Occupational Therapy

## 2023-05-11 ENCOUNTER — Ambulatory Visit: Payer: 59 | Attending: Oncology | Admitting: Occupational Therapy

## 2023-05-11 DIAGNOSIS — I890 Lymphedema, not elsewhere classified: Secondary | ICD-10-CM

## 2023-05-11 DIAGNOSIS — M79602 Pain in left arm: Secondary | ICD-10-CM

## 2023-05-11 DIAGNOSIS — M6281 Muscle weakness (generalized): Secondary | ICD-10-CM | POA: Diagnosis present

## 2023-05-11 DIAGNOSIS — M25622 Stiffness of left elbow, not elsewhere classified: Secondary | ICD-10-CM | POA: Diagnosis present

## 2023-05-19 ENCOUNTER — Encounter: Payer: Self-pay | Admitting: Occupational Therapy

## 2023-05-19 NOTE — Therapy (Signed)
OUTPATIENT OCCUPATIONAL THERAPY ORTHO EVALUATION  Patient Name: Leslie Bautista MRN: [id number] DOB:05/12/1961, 62 y.o., female  PCP: [facility] REFERRING PROVIDER: Hewitt Blade  END OF SESSION:  OT End of Session - 05/19/23 2005     Visit Number 1    Number of Visits 8    Date for OT Re-Evaluation 06/29/23    OT Start Time 0900    OT Stop Time 0945    OT Time Calculation (min) 45 min    Activity Tolerance Patient tolerated treatment well    Behavior During Therapy WFL for tasks assessed/performed             Past Medical History:  Diagnosis Date   Cataract    Depression    Hypertension    Morbid obesity with BMI of 50.0-59.9, adult (HCC)    Obstructive sleep apnea    Osteoarthritis of both knees    Pre-diabetes    Past Surgical History:  Procedure Laterality Date   ANKLE SURGERY Right    ligament repair   CATARACT EXTRACTION, BILATERAL Bilateral 2022   COLONOSCOPY  2015   TONSILLECTOMY Bilateral 06/14/2021   Procedure: TONSILLECTOMY;  Surgeon: Bud Face, MD;  Location: [facility] ORS;  Service: ENT;  Laterality: Bilateral;   TOTAL KNEE ARTHROPLASTY Right    TUBAL LIGATION     Patient Active Problem List   Diagnosis Date Noted   Chemotherapy-induced neutropenia (HCC) 12/05/2022   Thrombocytopenia (HCC) 12/05/2022   Lower urinary tract infectious disease 12/03/2022   Myocardial injury 12/03/2022   Obesity, Class III, BMI 40-49.9 (morbid obesity) (HCC) 12/03/2022   Breast cancer (HCC) 12/03/2022   HTN (hypertension) 12/03/2022   HLD (hyperlipidemia) 12/03/2022   OSA on CPAP 12/03/2022   Endometrial thickening on ultrasound 07/05/2021   Dyspareunia due to medical condition in female 10/24/2019   Postmenopause bleeding 03/08/2018   Pre-diabetes 11/22/2015   Depression 06/03/2014   Class 3 severe obesity with serious comorbidity and body mass index (BMI) of 50.0 to 59.9 in adult (HCC) 01/07/2014   Hypertension, benign 10/25/2011    Primary localized osteoarthrosis, lower leg 12/19/2007    ONSET DATE: December 2024  REFERRING DIAG: lymphedema Invasive ductal carcinoma of left breast  THERAPY DIAG:  Lymphedema, not elsewhere classified  Muscle weakness (generalized)  Pain in left arm  Stiffness of joint, upper arm, left  Rationale for Evaluation and Treatment: Rehabilitation  SUBJECTIVE:   SUBJECTIVE STATEMENT: Pt reports she was diagnosed with breast cancer in February of 2024, she underwent chemo, radiation until Oct 25 with about 20 treatments.  She had surgery on April 4th, 2024 for and also performed a breast reduction at the same time.  She reports left arm tightness and "thick" feeling with a pulling sensation to her back along her extensive scar line. She reports decreased sensation in the area of her surgery.  She reports decreased ROM and difficulty with reaching tasks, has bilateral neuropathy of the hands.   Pt accompanied by: self  PERTINENT HISTORY: Pt with complex medical history over the last year with recent cancer diagnosis.  She underwent surgery on 08/24/22 for a left lumpectomy with SLNB and b/l oncoplastic rearrangement of tissue with reduction. She underwent chemotherapy with last dose given on 12/19/22 after 3 cycles and overall poor tolerance.  Radiation to left breast with boost to tumor bed 02/19/2023 and completed on 03/16/2023.    PRECAUTIONS: None   WEIGHT BEARING RESTRICTIONS: No  PAIN:  Are you having pain? Yes: NPRS scale: 5  Pain location: left arm, back Pain description: aching, pulling Aggravating factors: movement Relieving factors: nothing currently  FALLS: Has patient fallen in last 6 months? No  LIVING ENVIRONMENT: Lives with: lives with their family Lives in: House/apartment Has following equipment at home: None  PLOF: Independent  PATIENT GOALS: Pt would like to decrease the pain and stiffness of her left arm, decrease the pulling towards her back   NEXT MD  VISIT: TBD  OBJECTIVE:  Note: Objective measures were completed at Evaluation unless otherwise noted.  HAND DOMINANCE: Right  ADLs: Overall ADLs: Pt able to complete basic self care tasks however has pain in left UE, difficulty with reaching tasks. She reports she feels her left arm is thick and tight, pulling towards her back along scar line.  She currently wears a compression bra and has decreased sensation in the area of her surgery.  She is currently not working.  She lives with her daughters and grandson.  She is able to engage in light homemaking tasks, cooking and is still able to drive.  Her daughters assist with heavier household chores.    FUNCTIONAL OUTCOME MEASURES: FOTO: TBD  UPPER EXTREMITY ROM:     Active ROM Right eval Left eval  Shoulder flexion 150 128  Shoulder abduction 130 121  Shoulder adduction    Shoulder extension    Shoulder internal rotation    Shoulder external rotation    Elbow flexion WNL WNL  Elbow extension    Wrist flexion WNL WNL  Wrist extension    Wrist ulnar deviation    Wrist radial deviation    Wrist pronation WNL WNL  Wrist supination WNL WNL  (Blank rows = not tested)   UPPER EXTREMITY MMT:     MMT Right eval Left eval  Shoulder flexion    Shoulder abduction    Shoulder adduction    Shoulder extension    Shoulder internal rotation    Shoulder external rotation    Middle trapezius    Lower trapezius    Elbow flexion    Elbow extension    Wrist flexion    Wrist extension    Wrist ulnar deviation    Wrist radial deviation    Wrist pronation    Wrist supination    (Blank rows = not tested)  HAND FUNCTION: Grip strength: Right: 55 lbs; Left: 40 lbs, Lateral pinch: Right: 11 lbs, Left: 9 lbs, and 3 point pinch: Right: 7 lbs, Left: 5 lbs  COORDINATION: Intact however 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   SENSATION: Decreased sensation in the area her surgery around breast and back area along scarline.Pt reports  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     EDEMA: See flowsheet for circumferential measurements of bilateral UEs.  COGNITION: Overall cognitive status: Within functional limits for tasks assessed  TREATMENT DATE: 05/11/2023  PATIENT EDUCATION: Education details: ROM exercises for bilateral UEs, with focus on left.  Scar massage, gentle massage around breast and towards back where she has the most tightness and stiffness. Person educated: Patient Education method: Medical illustrator Education comprehension: verbalized understanding and returned demonstration  HOME EXERCISE PROGRAM: Pt to perform gentle range of motion and stretching exercises at home with assistance from her daughters to massage along scar line and areas of tightness which are limiting her current motion in her arm.  She reports feeling of "thickness" however after treatment this date she reported area felt much better and pain decreased.    GOALS: Goals reviewed with patient? Yes  SHORT TERM GOALS: Target date: 06/01/2023  Pt to demonstrate understanding of home exercise program with modified independence. Baseline: no current program at eval. Goal status: INITIAL   LONG TERM GOALS: Target date: 06/29/2023  Pt will demonstrate decrease in pain, 2/10 or less in her left UE during self care tasks. Baseline: currently increased pain and tightness in left UE which makes her self care tasks difficult Goal status: INITIAL  2.  Pt to demonstrate improved left shoulder flexion by 10 degrees to facilitate reaching to obtain items from closet and shelves. Baseline: difficulty with reach at eval Goal status: INITIAL  3.  Pt to demonstrate understanding of home program to manage symptoms of lymphedema including use of compression garments and/or pump if needed. Baseline: no current knowledge. Goal  status: INITIAL  ASSESSMENT:  CLINICAL IMPRESSION: Patient is a 62 y.o. female who was seen today for occupational therapy evaluation for lymphedema of left UE with pain in left arm. Pt is s/p lumpectomy in April of 2024 followed by 3 rounds of chemotherapy and 20 radiation treatments.  She also underwent a breast reduction on the right at the same time of surgery.  Pt presents with left arm and back pain which limits her active range of motion and resulting in decreased ability to perform reaching tasks, ADL and IADL tasks.  She will benefit from skilled OT intervention to maximize safety and independence in necessary daily tasks.    PERFORMANCE DEFICITS: in functional skills including ADLs, IADLs, coordination, dexterity, sensation, edema, ROM, strength, pain, fascial restrictions, flexibility, decreased knowledge of use of DME, and UE functional use,  and psychosocial skills including environmental adaptation, habits, and routines and behaviors.   IMPAIRMENTS: are limiting patient from ADLs, IADLs, rest and sleep, and social participation.   COMORBIDITIES: may have co-morbidities  that affects occupational performance. Patient will benefit from skilled OT to address above impairments and improve overall function.  MODIFICATION OR ASSISTANCE TO COMPLETE EVALUATION: Min-Moderate modification of tasks or assist with assess necessary to complete an evaluation.  OT OCCUPATIONAL PROFILE AND HISTORY: Detailed assessment: Review of records and additional review of physical, cognitive, psychosocial history related to current functional performance.  CLINICAL DECISION MAKING: Moderate - several treatment options, min-mod task modification necessary  REHAB POTENTIAL: Good  EVALUATION COMPLEXITY: Moderate    PLAN:  OT FREQUENCY: 1x/week  OT DURATION: 8 weeks  PLANNED INTERVENTIONS: 97168 OT Re-evaluation, 97535 self care/ADL training, 88416 therapeutic exercise, 97530 therapeutic activity,  97112 neuromuscular re-education, 97140 manual therapy, manual lymph drainage, passive range of motion, compression bandaging, coping strategies training, patient/family education, and DME and/or AE instructions  RECOMMENDED OTHER SERVICES: none currently  CONSULTED AND AGREED WITH PLAN OF CARE: Patient  Kerrie Buffalo, OTR/L, CLT  05/19/2023, 8:58 PM

## 2023-05-22 LAB — HM MAMMOGRAPHY

## 2023-05-25 ENCOUNTER — Encounter: Payer: Self-pay | Admitting: Occupational Therapy

## 2023-05-25 ENCOUNTER — Ambulatory Visit: Payer: 59 | Attending: Oncology | Admitting: Occupational Therapy

## 2023-05-25 DIAGNOSIS — I890 Lymphedema, not elsewhere classified: Secondary | ICD-10-CM | POA: Insufficient documentation

## 2023-05-25 DIAGNOSIS — M79602 Pain in left arm: Secondary | ICD-10-CM | POA: Insufficient documentation

## 2023-05-25 DIAGNOSIS — M6281 Muscle weakness (generalized): Secondary | ICD-10-CM | POA: Diagnosis present

## 2023-05-25 DIAGNOSIS — M25622 Stiffness of left elbow, not elsewhere classified: Secondary | ICD-10-CM | POA: Diagnosis present

## 2023-05-25 NOTE — Therapy (Signed)
 OUTPATIENT OCCUPATIONAL THERAPY ORTHO TREATMENT  Patient Name: Leslie Bautista MRN: [id number] DOB:May 09, 1961, 63 y.o., female  PCP: [facility] REFERRING PROVIDER: Myer Purchase  END OF SESSION:  OT End of Session - 05/25/23 1805     Visit Number 2    Number of Visits 8    Date for OT Re-Evaluation 06/29/23    OT Start Time 0945    OT Stop Time 1040    OT Time Calculation (min) 55 min    Activity Tolerance Patient tolerated treatment well    Behavior During Therapy WFL for tasks assessed/performed             Past Medical History:  Diagnosis Date   Cataract    Depression    Hypertension    Morbid obesity with BMI of 50.0-59.9, adult (HCC)    Obstructive sleep apnea    Osteoarthritis of both knees    Pre-diabetes    Past Surgical History:  Procedure Laterality Date   ANKLE SURGERY Right    ligament repair   CATARACT EXTRACTION, BILATERAL Bilateral 2022   COLONOSCOPY  2015   TONSILLECTOMY Bilateral 06/14/2021   Procedure: TONSILLECTOMY;  Surgeon: Milissa Hamming, MD;  Location: [facility] ORS;  Service: ENT;  Laterality: Bilateral;   TOTAL KNEE ARTHROPLASTY Right    TUBAL LIGATION     Patient Active Problem List   Diagnosis Date Noted   Chemotherapy-induced neutropenia (HCC) 12/05/2022   Thrombocytopenia (HCC) 12/05/2022   Lower urinary tract infectious disease 12/03/2022   Myocardial injury 12/03/2022   Obesity, Class III, BMI 40-49.9 (morbid obesity) (HCC) 12/03/2022   Breast cancer (HCC) 12/03/2022   HTN (hypertension) 12/03/2022   HLD (hyperlipidemia) 12/03/2022   OSA on CPAP 12/03/2022   Endometrial thickening on ultrasound 07/05/2021   Dyspareunia due to medical condition in female 10/24/2019   Postmenopause bleeding 03/08/2018   Pre-diabetes 11/22/2015   Depression 06/03/2014   Class 3 severe obesity with serious comorbidity and body mass index (BMI) of 50.0 to 59.9 in adult (HCC) 01/07/2014   Hypertension, benign 10/25/2011    Primary localized osteoarthrosis, lower leg 12/19/2007    ONSET DATE: December 2024  REFERRING DIAG: lymphedema Invasive ductal carcinoma of left breast  THERAPY DIAG:  Lymphedema, not elsewhere classified  Muscle weakness (generalized)  Stiffness of joint, upper arm, left  Pain in left arm  Rationale for Evaluation and Treatment: Rehabilitation  SUBJECTIVE:   SUBJECTIVE STATEMENT: Pt reports she asked her daughters to help with massage since last session and they helped a few times, they can't do it like you do here.  Pt reports she asked her daughter to come with her today for therapy to see what to do but she didn't come.     Pt accompanied by: self  PERTINENT HISTORY: Pt with complex medical history over the last year with recent cancer diagnosis.  She underwent surgery on 08/24/22 for a left lumpectomy with SLNB and b/l oncoplastic rearrangement of tissue with reduction. She underwent chemotherapy with last dose given on 12/19/22 after 3 cycles and overall poor tolerance.  Radiation to left breast with boost to tumor bed 02/19/2023 and completed on 03/16/2023.    PRECAUTIONS: None   WEIGHT BEARING RESTRICTIONS: No  PAIN:  Are you having pain? Yes: NPRS scale: 5 Pain location: left arm, back Pain description: aching, pulling Aggravating factors: movement Relieving factors: nothing currently  FALLS: Has patient fallen in last 6 months? No  LIVING ENVIRONMENT: Lives with: lives with their family Lives in:  House/apartment Has following equipment at home: None  PLOF: Independent  PATIENT GOALS: Pt would like to decrease the pain and stiffness of her left arm, decrease the pulling towards her back   NEXT MD VISIT: TBD  OBJECTIVE:  Note: Objective measures were completed at Evaluation unless otherwise noted.  HAND DOMINANCE: Right  ADLs: Overall ADLs: Pt able to complete basic self care tasks however has pain in left UE, difficulty with reaching tasks. She  reports she feels her left arm is thick and tight, pulling towards her back along scar line.  She currently wears a compression bra and has decreased sensation in the area of her surgery.  She is currently not working.  She lives with her daughters and grandson.  She is able to engage in light homemaking tasks, cooking and is still able to drive.  Her daughters assist with heavier household chores.    FUNCTIONAL OUTCOME MEASURES: FOTO: TBD  UPPER EXTREMITY ROM:     Active ROM Right eval Left eval  Shoulder flexion 150 128  Shoulder abduction 130 121  Shoulder adduction    Shoulder extension    Shoulder internal rotation    Shoulder external rotation    Elbow flexion WNL WNL  Elbow extension    Wrist flexion WNL WNL  Wrist extension    Wrist ulnar deviation    Wrist radial deviation    Wrist pronation WNL WNL  Wrist supination WNL WNL  (Blank rows = not tested)   UPPER EXTREMITY MMT:     MMT Right eval Left eval  Shoulder flexion    Shoulder abduction    Shoulder adduction    Shoulder extension    Shoulder internal rotation    Shoulder external rotation    Middle trapezius    Lower trapezius    Elbow flexion    Elbow extension    Wrist flexion    Wrist extension    Wrist ulnar deviation    Wrist radial deviation    Wrist pronation    Wrist supination    (Blank rows = not tested)  HAND FUNCTION: Grip strength: Right: 55 lbs; Left: 40 lbs, Lateral pinch: Right: 11 lbs, Left: 9 lbs, and 3 point pinch: Right: 7 lbs, Left: 5 lbs  COORDINATION: Intact however 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     EDEMA: See flowsheet for circumferential measurements of bilateral UEs.  COGNITION: Overall cognitive status: Within functional limits for tasks assessed  TREATMENT DATE: 05/24/2022    Pt reports continued tightness  around scar, side of breast and to her back.  She attempted to get her daughters to help with some massage since last session but they only helped a couple times.    Manual Therapy:   Pt seen this date for scar massage to scar line from breast to back, fibrotic techniques used as well as manual massage to side of breast and back to decrease pain, increase tissue mobility and promote lymphatic flow.    Therapeutic Exercises:  Measured shoulder flexion on left prior to ROM and stretching, 132 degrees, increased by 5 degrees from eval.  Pt seen for AAROM of shoulder flexion, ABD, ER, Diagonal patterns with prolonged stretching and counter force stretch by therapist when performing these motions.  Added dowel for shoulder flexion, ABD, diagonal patterns and forwards and backwards circles performed in supine.  In supine  pt able to achieve 150 degrees of shoulder flexion, in sitting 145 degrees after exercises.  Pt reported decreased pain and increased motion at end of session.  Exercises written for home program.      PATIENT EDUCATION: Education details: ROM exercises for bilateral UEs, with focus on left.  Scar massage, gentle massage around breast and towards back where she has the most tightness and stiffness. Person educated: Patient Education method: Medical Illustrator Education comprehension: verbalized understanding and returned demonstration  HOME EXERCISE PROGRAM: Pt to perform gentle range of motion and stretching exercises at home with assistance from her daughters to massage along scar line and areas of tightness which are limiting her current motion in her arm.  She reports feeling of thickness however after treatment this date she reported area felt much better and pain decreased.    GOALS: Goals reviewed with patient? Yes  SHORT TERM GOALS: Target date: 06/01/2023  Pt to demonstrate understanding of home exercise program with modified independence. Baseline: no current  program at eval. Goal status: INITIAL   LONG TERM GOALS: Target date: 06/29/2023  Pt will demonstrate decrease in pain, 2/10 or less in her left UE during self care tasks. Baseline: currently increased pain and tightness in left UE which makes her self care tasks difficult Goal status: INITIAL  2.  Pt to demonstrate improved left shoulder flexion by 10 degrees to facilitate reaching to obtain items from closet and shelves. Baseline: difficulty with reach at eval Goal status: INITIAL  3.  Pt to demonstrate understanding of home program to manage symptoms of lymphedema including use of compression garments and/or pump if needed. Baseline: no current knowledge. Goal status: INITIAL  ASSESSMENT:  CLINICAL IMPRESSION: Patient is a 63 y.o. female who was seen today for occupational therapy evaluation for lymphedema of left UE with pain in left arm. Pt is s/p lumpectomy in April of 2024 followed by 3 rounds of chemotherapy and 20 radiation treatments.  She also underwent a breast reduction on the right at the same time of surgery.  Pt presents with left arm and back pain which limits her active range of motion and resulting in decreased ability to perform reaching tasks, ADL and IADL tasks.  Pt seen this date for manual therapy for scar management and to decrease tightness along scarline, side of breast and around to her back which is causing increased pain and decreased motion.  After manual massage, stretching and ROM, she demonstrated increased ROM by more than 10 degrees and decreased pain.  Pt to continue with HEP and follow up in 2 weeks. She will benefit from skilled OT intervention to maximize safety and independence in necessary daily tasks.    PERFORMANCE DEFICITS: in functional skills including ADLs, IADLs, coordination, dexterity, sensation, edema, ROM, strength, pain, fascial restrictions, flexibility, decreased knowledge of use of DME, and UE functional use,  and psychosocial skills  including environmental adaptation, habits, and routines and behaviors.   IMPAIRMENTS: are limiting patient from ADLs, IADLs, rest and sleep, and social participation.   COMORBIDITIES: may have co-morbidities  that affects occupational performance. Patient will benefit from skilled OT to address above impairments and improve overall function.  MODIFICATION OR ASSISTANCE TO COMPLETE EVALUATION: Min-Moderate modification of tasks or assist with assess necessary to complete an evaluation.  OT OCCUPATIONAL PROFILE AND HISTORY: Detailed assessment: Review of records and additional review of physical, cognitive, psychosocial history related to current functional performance.  CLINICAL DECISION MAKING: Moderate - several treatment options, min-mod task  modification necessary  REHAB POTENTIAL: Good  EVALUATION COMPLEXITY: Moderate   PLAN:  OT FREQUENCY: 1x/week  OT DURATION: 8 weeks  PLANNED INTERVENTIONS: 97168 OT Re-evaluation, 97535 self care/ADL training, 02889 therapeutic exercise, 97530 therapeutic activity, 97112 neuromuscular re-education, 97140 manual therapy, manual lymph drainage, passive range of motion, compression bandaging, coping strategies training, patient/family education, and DME and/or AE instructions  RECOMMENDED OTHER SERVICES: none currently  CONSULTED AND AGREED WITH PLAN OF CARE: Patient  Greig ONEIDA G[place], OTR/L, CLT 05/25/2023, 7:20 PM

## 2023-06-05 ENCOUNTER — Ambulatory Visit: Payer: 59 | Admitting: Occupational Therapy

## 2023-06-08 ENCOUNTER — Ambulatory Visit: Payer: 59 | Admitting: Occupational Therapy

## 2023-06-08 DIAGNOSIS — I890 Lymphedema, not elsewhere classified: Secondary | ICD-10-CM

## 2023-06-08 DIAGNOSIS — M6281 Muscle weakness (generalized): Secondary | ICD-10-CM

## 2023-06-08 DIAGNOSIS — M25622 Stiffness of left elbow, not elsewhere classified: Secondary | ICD-10-CM

## 2023-06-08 DIAGNOSIS — M79602 Pain in left arm: Secondary | ICD-10-CM

## 2023-06-08 NOTE — Therapy (Signed)
OUTPATIENT OCCUPATIONAL THERAPY ORTHO TREATMENT  Patient Name: Leslie Bautista MRN: [id number] DOB:[birth year], 63 y.o., female  PCP: [facility] REFERRING PROVIDER: Hewitt Blade  END OF SESSION:  OT End of Session - 06/08/23 1456     Visit Number 3    Number of Visits 8    Date for OT Re-Evaluation 06/29/23    OT Start Time 1035    OT Stop Time 1124    OT Time Calculation (min) 49 min    Activity Tolerance Patient tolerated treatment well    Behavior During Therapy WFL for tasks assessed/performed             Past Medical History:  Diagnosis Date   Cataract    Depression    Hypertension    Morbid obesity with BMI of 50.0-59.9, adult (HCC)    Obstructive sleep apnea    Osteoarthritis of both knees    Pre-diabetes    Past Surgical History:  Procedure Laterality Date   ANKLE SURGERY Right    ligament repair   CATARACT EXTRACTION, BILATERAL Bilateral 2022   COLONOSCOPY  2015   TONSILLECTOMY Bilateral 06/14/2021   Procedure: TONSILLECTOMY;  Surgeon: Bud Face, MD;  Location: [facility] ORS;  Service: ENT;  Laterality: Bilateral;   TOTAL KNEE ARTHROPLASTY Right    TUBAL LIGATION     Patient Active Problem List   Diagnosis Date Noted   Chemotherapy-induced neutropenia (HCC) 12/05/2022   Thrombocytopenia (HCC) 12/05/2022   Lower urinary tract infectious disease 12/03/2022   Myocardial injury 12/03/2022   Obesity, Class III, BMI 40-49.9 (morbid obesity) (HCC) 12/03/2022   Breast cancer (HCC) 12/03/2022   HTN (hypertension) 12/03/2022   HLD (hyperlipidemia) 12/03/2022   OSA on CPAP 12/03/2022   Endometrial thickening on ultrasound 07/05/2021   Dyspareunia due to medical condition in female 10/24/2019   Postmenopause bleeding 03/08/2018   Pre-diabetes 11/22/2015   Depression 06/03/2014   Class 3 severe obesity with serious comorbidity and body mass index (BMI) of 50.0 to 59.9 in adult (HCC) 01/07/2014   Hypertension, benign 10/25/2011    Primary localized osteoarthrosis, lower leg 12/19/2007    ONSET DATE: December 2024  REFERRING DIAG: lymphedema Invasive ductal carcinoma of left breast  THERAPY DIAG:  Lymphedema, not elsewhere classified  Muscle weakness (generalized)  Stiffness of joint, upper arm, left  Pain in left arm  Rationale for Evaluation and Treatment: Rehabilitation  SUBJECTIVE:   SUBJECTIVE STATEMENT: I have not done the exercises or anything this morning.  Because I was coming to see you.  I have been using this chip back on my left breast.  Feels better but I still feel a pull reaching overhead.   Pt accompanied by: self  PERTINENT HISTORY: Pt with complex medical history over the last year with recent cancer diagnosis.  She underwent surgery on 08/24/22 for a left lumpectomy with SLNB and b/l oncoplastic rearrangement of tissue with reduction. She underwent chemotherapy with last dose given on 12/19/22 after 3 cycles and overall poor tolerance.  Radiation to left breast with boost to tumor bed 02/19/2023 and completed on 03/16/2023.    PRECAUTIONS: None   WEIGHT BEARING RESTRICTIONS: No  PAIN:  Are you having pain?  Pull in left axilla and lateral breast with shoulder abduction overhead.  3-5/10 FALLS: Has patient fallen in last 6 months? No  LIVING ENVIRONMENT: Lives with: lives with their family Lives in: House/apartment Has following equipment at home: None  PLOF: Independent  PATIENT GOALS: Pt would like to decrease  the pain and stiffness of her left arm, decrease the pulling towards her back   NEXT MD VISIT: TBD  OBJECTIVE:  Note: Objective measures were completed at Evaluation unless otherwise noted.  HAND DOMINANCE: Right  ADLs: Overall ADLs: Pt able to complete basic self care tasks however has pain in left UE, difficulty with reaching tasks. She reports she feels her left arm is thick and tight, pulling towards her back along scar line.  She currently wears a compression bra  and has decreased sensation in the area of her surgery.  She is currently not working.  She lives with her daughters and grandson.  She is able to engage in light homemaking tasks, cooking and is still able to drive.  Her daughters assist with heavier household chores.    FUNCTIONAL OUTCOME MEASURES: FOTO: TBD  UPPER EXTREMITY ROM:     Active ROM Right eval Left eval L 06/08/23  Shoulder flexion 150 128 160  Shoulder abduction 130 121 140  Shoulder adduction     Shoulder extension     Shoulder internal rotation     Shoulder external rotation     Elbow flexion WNL WNL   Elbow extension     Wrist flexion WNL WNL   Wrist extension     Wrist ulnar deviation     Wrist radial deviation     Wrist pronation WNL WNL   Wrist supination WNL WNL   (Blank rows = not tested)   UPPER EXTREMITY MMT:     MMT Right eval Left eval  Shoulder flexion    Shoulder abduction    Shoulder adduction    Shoulder extension    Shoulder internal rotation    Shoulder external rotation    Middle trapezius    Lower trapezius    Elbow flexion    Elbow extension    Wrist flexion    Wrist extension    Wrist ulnar deviation    Wrist radial deviation    Wrist pronation    Wrist supination    (Blank rows = not tested)  HAND FUNCTION: Grip strength: Right: 55 lbs; Left: 40 lbs, Lateral pinch: Right: 11 lbs, Left: 9 lbs, and 3 point pinch: Right: 7 lbs, Left: 5 lbs  COORDINATION: Intact however 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     EDEMA: See flowsheet for circumferential measurements of bilateral UEs.  COGNITION: Overall cognitive status: Within functional limits for tasks assessed  TREATMENT DATE: 06/07/2022    Pt reports continued tightness and pull over left lateral breast and axilla.  Especially with abduction and external  rotation.  Manual Therapy:   Patient arrived with chip bag in place on left lateral breast inside compression bra.  Reviewed with patient again use as well as importance of using it.   Done manual massage as well as mini massager today on left breast from 3:00 to 6:00  With great success.  Pain and tenderness before soft tissue massage 9/10 improved to less than 3/10.   Done also some soft tissue massage and scar massage and axilla and over pectoralis major.  Using mini massager again.   Educated patient in anatomy of pectoralis major muscle and stretches that occurs during external rotation and shoulder abduction.    Therapeutic Exercises:  Did assess patient's shoulder flexion and abduction coming in.  With great progress.  Shoulder abduction  worse than flexion as well as external rotation worse than flexion abduction.  After manual therapy done in supine dowel for shoulder flexion -followed by external rotation stretch increasing gradually from pillow to no pillow left right and then bilateral.  Patient tolerating very well pull more on the left than the right. Reviewed and add for patient external rotation stretch gentle in corner 10 reps hold 5 seconds Followed by active assisted range of motion on the wall for shoulder flexion and abduction 10 reps each.   PATIENT EDUCATION: Education details: ROM exercises for bilateral UEs, with focus on left.  Scar massage, gentle massage around breast and towards back where she has the most tightness and stiffness. Person educated: Patient Education method: Medical illustrator Education comprehension: verbalized understanding and returned demonstration  HOME EXERCISE PROGRAM: Pt to perform gentle range of motion and stretching exercises at home with assistance from her daughters to massage along scar line and areas of tightness which are limiting her current motion in her arm.  She reports feeling of "thickness" however after treatment this  date she reported area felt much better and pain decreased.    GOALS: Goals reviewed with patient? Yes  SHORT TERM GOALS: Target date: 06/01/2023  Pt to demonstrate understanding of home exercise program with modified independence. Baseline: no current program at eval. Goal status: INITIAL   LONG TERM GOALS: Target date: 06/29/2023  Pt will demonstrate decrease in pain, 2/10 or less in her left UE during self care tasks. Baseline: currently increased pain and tightness in left UE which makes her self care tasks difficult Goal status: INITIAL  2.  Pt to demonstrate improved left shoulder flexion by 10 degrees to facilitate reaching to obtain items from closet and shelves. Baseline: difficulty with reach at eval Goal status: INITIAL  3.  Pt to demonstrate understanding of home program to manage symptoms of lymphedema including use of compression garments and/or pump if needed. Baseline: no current knowledge. Goal status: INITIAL  ASSESSMENT:  CLINICAL IMPRESSION: Patient is a 63 y.o. female who was seen today for occupational therapy evaluation for lymphedema of left UE with pain in left arm. Pt is s/p lumpectomy in April of 2024 followed by 3 rounds of chemotherapy and 20 radiation treatments.  She also underwent a breast reduction on the right at the same time of surgery.  Pt presents with left arm and back pain which limits her active range of motion and resulting in decreased ability to perform reaching tasks, ADL and IADL tasks.  Patient arrive with increased shoulder flexion more than abduction and external rotation.  Patient continued to have pull or stretch over left axilla and lateral breast with overhead abduction and external rotation.  Manual therapy done patient tolerated very well pain and tenderness improved on lateral breast from 9/10 to 3/10.  Reviewed with patient external rotation stretch as well as active assisted range of motion for shoulder flexion abduction on wall.   Pt to continue with HEP and follow up in 2 weeks. She will benefit from skilled OT intervention to maximize safety and independence in necessary daily tasks.    PERFORMANCE DEFICITS: in functional skills including ADLs, IADLs, coordination, dexterity, sensation, edema, ROM, strength, pain, fascial restrictions, flexibility, decreased knowledge of use of DME, and UE functional use,  and psychosocial skills including environmental adaptation, habits, and routines and behaviors.   IMPAIRMENTS: are limiting patient from ADLs, IADLs, rest and sleep, and social participation.   COMORBIDITIES: may have co-morbidities  that  affects occupational performance. Patient will benefit from skilled OT to address above impairments and improve overall function.  MODIFICATION OR ASSISTANCE TO COMPLETE EVALUATION: Min-Moderate modification of tasks or assist with assess necessary to complete an evaluation.  OT OCCUPATIONAL PROFILE AND HISTORY: Detailed assessment: Review of records and additional review of physical, cognitive, psychosocial history related to current functional performance.  CLINICAL DECISION MAKING: Moderate - several treatment options, min-mod task modification necessary  REHAB POTENTIAL: Good  EVALUATION COMPLEXITY: Moderate   PLAN:  OT FREQUENCY: 1x/week  OT DURATION: 8 weeks  PLANNED INTERVENTIONS: 97168 OT Re-evaluation, 97535 self care/ADL training, 16109 therapeutic exercise, 97530 therapeutic activity, 97112 neuromuscular re-education, 97140 manual therapy, manual lymph drainage, passive range of motion, compression bandaging, coping strategies training, patient/family education, and DME and/or AE instructions  RECOMMENDED OTHER SERVICES: none currently  CONSULTED AND AGREED WITH PLAN OF CARE: Patient  Gloris Manchester, OTR/L, CLT 06/08/2023, 2:58 PM

## 2023-06-09 IMAGING — CR DG CHEST 2V
1 series · 2 of 2 positions shown · non-contrast
Comparison: 05/29/2018.

CLINICAL DATA: CP

EXAM:
CHEST - 2 VIEW

[Series 1: dg chest 2 view · 0.14mm/px · 2 of 2 slices shown]
[im 1/2]
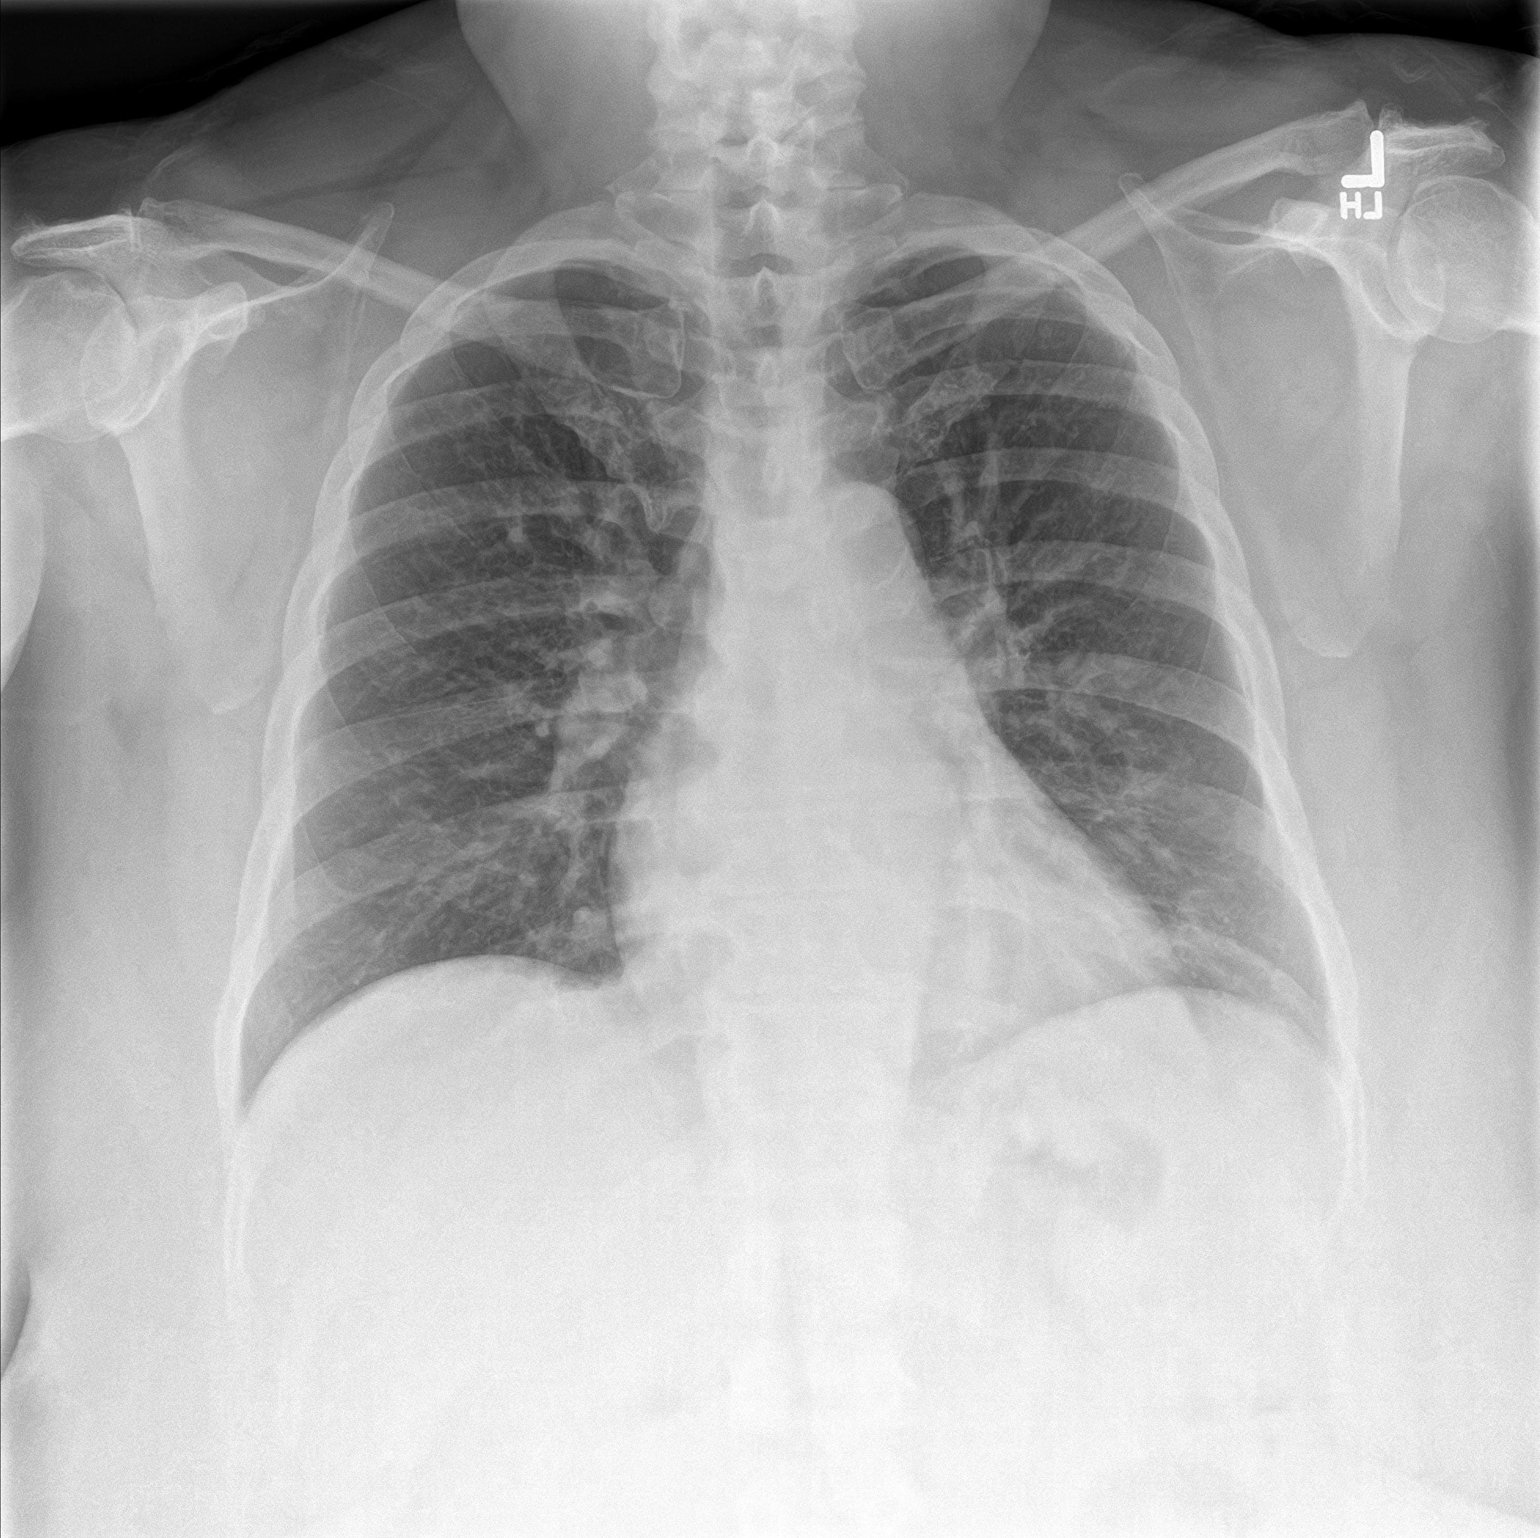
[im 2/2]
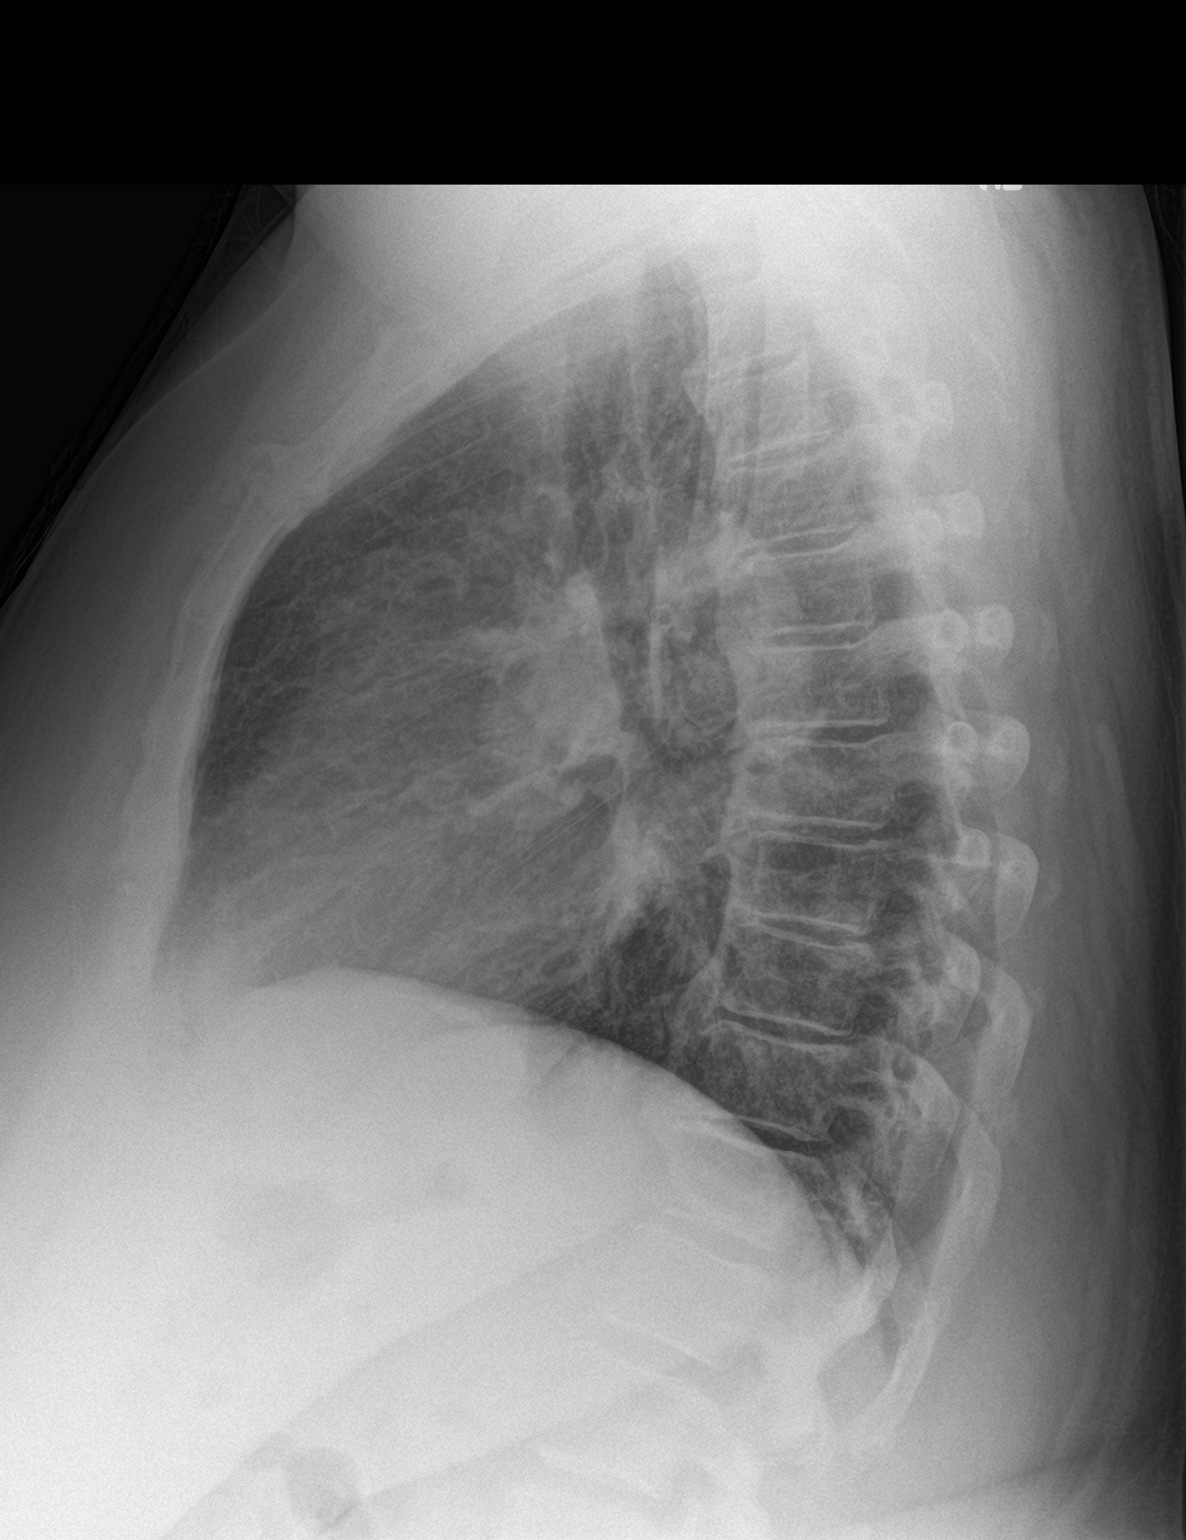

[2 of 2 positions shown; findings below may reference images not displayed]

FINDINGS: No consolidation. No visible pleural effusions or pneumothorax.
Cardiomediastinal silhouette is within normal limits and similar to
prior.
IMPRESSION: No evidence of acute cardiopulmonary disease.

## 2023-06-18 ENCOUNTER — Ambulatory Visit: Payer: 59 | Admitting: Occupational Therapy

## 2023-06-18 ENCOUNTER — Other Ambulatory Visit: Payer: Self-pay | Admitting: Internal Medicine

## 2023-06-18 DIAGNOSIS — R197 Diarrhea, unspecified: Secondary | ICD-10-CM

## 2023-06-18 DIAGNOSIS — I890 Lymphedema, not elsewhere classified: Secondary | ICD-10-CM

## 2023-06-18 DIAGNOSIS — M79602 Pain in left arm: Secondary | ICD-10-CM

## 2023-06-18 DIAGNOSIS — Z853 Personal history of malignant neoplasm of breast: Secondary | ICD-10-CM

## 2023-06-18 DIAGNOSIS — M25622 Stiffness of left elbow, not elsewhere classified: Secondary | ICD-10-CM

## 2023-06-18 DIAGNOSIS — M6281 Muscle weakness (generalized): Secondary | ICD-10-CM

## 2023-06-18 DIAGNOSIS — R109 Unspecified abdominal pain: Secondary | ICD-10-CM

## 2023-06-18 NOTE — Therapy (Signed)
OUTPATIENT OCCUPATIONAL THERAPY ORTHO TREATMENT  Patient Name: Leslie Bautista MRN: [id number] DOB:09-19-1960, 63 y.o., female  PCP: [facility] REFERRING PROVIDER: Hewitt Blade  END OF SESSION:  OT End of Session - 06/18/23 1319     Visit Number 4    Number of Visits 8    Date for OT Re-Evaluation 06/29/23    OT Start Time 1048    OT Stop Time 1115    OT Time Calculation (min) 27 min    Activity Tolerance Patient tolerated treatment well    Behavior During Therapy WFL for tasks assessed/performed             Past Medical History:  Diagnosis Date   Cataract    Depression    Hypertension    Morbid obesity with BMI of 50.0-59.9, adult (HCC)    Obstructive sleep apnea    Osteoarthritis of both knees    Pre-diabetes    Past Surgical History:  Procedure Laterality Date   ANKLE SURGERY Right    ligament repair   CATARACT EXTRACTION, BILATERAL Bilateral 2022   COLONOSCOPY  2015   TONSILLECTOMY Bilateral 06/14/2021   Procedure: TONSILLECTOMY;  Surgeon: Bud Face, MD;  Location: [facility] ORS;  Service: ENT;  Laterality: Bilateral;   TOTAL KNEE ARTHROPLASTY Right    TUBAL LIGATION     Patient Active Problem List   Diagnosis Date Noted   Chemotherapy-induced neutropenia (HCC) 12/05/2022   Thrombocytopenia (HCC) 12/05/2022   Lower urinary tract infectious disease 12/03/2022   Myocardial injury 12/03/2022   Obesity, Class III, BMI 40-49.9 (morbid obesity) (HCC) 12/03/2022   Breast cancer (HCC) 12/03/2022   HTN (hypertension) 12/03/2022   HLD (hyperlipidemia) 12/03/2022   OSA on CPAP 12/03/2022   Endometrial thickening on ultrasound 07/05/2021   Dyspareunia due to medical condition in female 10/24/2019   Postmenopause bleeding 03/08/2018   Pre-diabetes 11/22/2015   Depression 06/03/2014   Class 3 severe obesity with serious comorbidity and body mass index (BMI) of 50.0 to 59.9 in adult (HCC) 01/07/2014   Hypertension, benign 10/25/2011    Primary localized osteoarthrosis, lower leg 12/19/2007    ONSET DATE: December 2024  REFERRING DIAG: lymphedema Invasive ductal carcinoma of left breast  THERAPY DIAG:  Lymphedema, not elsewhere classified  Muscle weakness (generalized)  Stiffness of joint, upper arm, left  Pain in left arm  Rationale for Evaluation and Treatment: Rehabilitation  SUBJECTIVE:   SUBJECTIVE STATEMENT: Doing better but still hurting down here under my arm when reaching out to side over head.  But done the massage at my breast and that feels much better.  Feel?.  I did bring my chip back for you to change little bit.   Pt accompanied by: self  PERTINENT HISTORY: Pt with complex medical history over the last year with recent cancer diagnosis.  She underwent surgery on 08/24/22 for a left lumpectomy with SLNB and b/l oncoplastic rearrangement of tissue with reduction. She underwent chemotherapy with last dose given on 12/19/22 after 3 cycles and overall poor tolerance.  Radiation to left breast with boost to tumor bed 02/19/2023 and completed on 03/16/2023.    PRECAUTIONS: None   WEIGHT BEARING RESTRICTIONS: No  PAIN:  Are you having pain?  Pull in  L lat trunk with shoulder abduction overhead.  3/10 FALLS: Has patient fallen in last 6 months? No  LIVING ENVIRONMENT: Lives with: lives with their family Lives in: House/apartment Has following equipment at home: None  PLOF: Independent  PATIENT GOALS: Pt would  like to decrease the pain and stiffness of her left arm, decrease the pulling towards her back   NEXT MD VISIT: TBD  OBJECTIVE:  Note: Objective measures were completed at Evaluation unless otherwise noted.  HAND DOMINANCE: Right  ADLs: Overall ADLs: Pt able to complete basic self care tasks however has pain in left UE, difficulty with reaching tasks. She reports she feels her left arm is thick and tight, pulling towards her back along scar line.  She currently wears a compression bra  and has decreased sensation in the area of her surgery.  She is currently not working.  She lives with her daughters and grandson.  She is able to engage in light homemaking tasks, cooking and is still able to drive.  Her daughters assist with heavier household chores.    FUNCTIONAL OUTCOME MEASURES: FOTO: TBD  UPPER EXTREMITY ROM:     Active ROM Right eval Left eval L 06/08/23 L 04/17/24  Shoulder flexion 150 128 160 170  Shoulder abduction 130 121 140 150 pull lat trunk  Shoulder adduction      Shoulder extension      Shoulder internal rotation      Shoulder external rotation      Elbow flexion WNL WNL    Elbow extension      Wrist flexion WNL WNL    Wrist extension      Wrist ulnar deviation      Wrist radial deviation      Wrist pronation WNL WNL    Wrist supination WNL WNL    (Blank rows = not tested)   UPPER EXTREMITY MMT:     MMT Right eval Left eval  Shoulder flexion    Shoulder abduction    Shoulder adduction    Shoulder extension    Shoulder internal rotation    Shoulder external rotation    Middle trapezius    Lower trapezius    Elbow flexion    Elbow extension    Wrist flexion    Wrist extension    Wrist ulnar deviation    Wrist radial deviation    Wrist pronation    Wrist supination    (Blank rows = not tested)  HAND FUNCTION: Grip strength: Right: 55 lbs; Left: 40 lbs, Lateral pinch: Right: 11 lbs, Left: 9 lbs, and 3 point pinch: Right: 7 lbs, Left: 5 lbs  COORDINATION: Intact however 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     EDEMA: See flowsheet for circumferential measurements of bilateral UEs.  COGNITION: Overall cognitive status: Within functional limits for tasks assessed  TREATMENT DATE: 06/17/2022    Pt reports tenderness and tightness and discomfort on the breast is much better.  She  has been doing the massage and helping.   Continues to feel mostly a pull on the lateral thoracic with shoulder abduction overhead.   Manual Therapy:   Patient arrived with chip bag in place on left lateral breast inside compression bra.  Back was falling apart.  OT made new one for her to  use on the breast for fibrosis/ scar tissue  -reviewed with patient again use as well as importance of using it.   Done manual massage as well as mini massager with pt in sidelying - on latissimus dorsi muscle at posterior shoulder with arm over head in shoulder ABD     Therapeutic Exercises:  Did assess patient's shoulder flexion and abduction coming in.  With great progress.  Shoulder abduction worse than flexion .  Prior to manual therapy reviewed with patient shoulder flexion sitting in a chair with arms on table for latissimus dorsi stretch can repeat sideways left and right due to increased stretch 10 reps each Report improvement with overhead motion  After manual therapy  After which done AAROM and contract and relax shoulder ABD sidelying- 15 reps After which pt done on wall - 10 reps  With much improvement Followed by active assisted range of motion on the wall for shoulder flexion and abduction 10 reps each. And red Thera-Band for patient for scapular retraction as well as shoulder extension 2 sets of 12 1 time a day. Encourage patient to do stretches 3 times a day.   PATIENT EDUCATION: Education details: ROM exercises for bilateral UEs, with focus on left.  Scar massage, gentle massage around breast and towards back where she has the most tightness and stiffness. Person educated: Patient Education method: Medical illustrator Education comprehension: verbalized understanding and returned demonstration  HOME EXERCISE PROGRAM: Pt to perform gentle range of motion and stretching exercises at home with assistance from her daughters to massage along scar line and areas of tightness which  are limiting her current motion in her arm.  She reports feeling of "thickness" however after treatment this date she reported area felt much better and pain decreased.    GOALS: Goals reviewed with patient? Yes  SHORT TERM GOALS: Target date: 06/01/2023  Pt to demonstrate understanding of home exercise program with modified independence. Baseline: no current program at eval. Goal status: INITIAL   LONG TERM GOALS: Target date: 06/29/2023  Pt will demonstrate decrease in pain, 2/10 or less in her left UE during self care tasks. Baseline: currently increased pain and tightness in left UE which makes her self care tasks difficult Goal status: INITIAL  2.  Pt to demonstrate improved left shoulder flexion by 10 degrees to facilitate reaching to obtain items from closet and shelves. Baseline: difficulty with reach at eval Goal status: INITIAL  3.  Pt to demonstrate understanding of home program to manage symptoms of lymphedema including use of compression garments and/or pump if needed. Baseline: no current knowledge. Goal status: INITIAL  ASSESSMENT:  CLINICAL IMPRESSION: Patient is a 63 y.o. female who was seen today for occupational therapy evaluation for lymphedema of left UE with pain in left arm. Pt is s/p lumpectomy in April of 2024 followed by 3 rounds of chemotherapy and 20 radiation treatments.  She also underwent a breast reduction on the right at the same time of surgery.  Pt presents with left arm and back pain which limits her active range of motion and resulting in decreased ability to perform reaching tasks, ADL and IADL tasks.  Patient arrive with increased shoulder flexion,abduction and external rotation.  Patient continued to have pull or stretch with shoulder abduction mostly now on lateral thoracic.  Provided patient for some stretches for latissimus dorsi as well as soft tissue massage-followed by great improvement.  Provided patient with new stretches as well as red  Thera-Band for scapular retraction and shoulder extension.  Great improvement with pain and tenderness in breast.  Pt to continue with HEP and follow up in 1-2 weeks. She will benefit from skilled OT intervention to maximize safety and independence in necessary daily tasks.    PERFORMANCE DEFICITS: in functional skills including ADLs, IADLs, coordination, dexterity, sensation, edema, ROM, strength,  pain, fascial restrictions, flexibility, decreased knowledge of use of DME, and UE functional use,  and psychosocial skills including environmental adaptation, habits, and routines and behaviors.   IMPAIRMENTS: are limiting patient from ADLs, IADLs, rest and sleep, and social participation.   COMORBIDITIES: may have co-morbidities  that affects occupational performance. Patient will benefit from skilled OT to address above impairments and improve overall function.  MODIFICATION OR ASSISTANCE TO COMPLETE EVALUATION: Min-Moderate modification of tasks or assist with assess necessary to complete an evaluation.  OT OCCUPATIONAL PROFILE AND HISTORY: Detailed assessment: Review of records and additional review of physical, cognitive, psychosocial history related to current functional performance.  CLINICAL DECISION MAKING: Moderate - several treatment options, min-mod task modification necessary  REHAB POTENTIAL: Good  EVALUATION COMPLEXITY: Moderate   PLAN:  OT FREQUENCY: 1x/week  OT DURATION: 8 weeks  PLANNED INTERVENTIONS: 97168 OT Re-evaluation, 97535 self care/ADL training, 16109 therapeutic exercise, 97530 therapeutic activity, 97112 neuromuscular re-education, 97140 manual therapy, manual lymph drainage, passive range of motion, compression bandaging, coping strategies training, patient/family education, and DME and/or AE instructions  RECOMMENDED OTHER SERVICES: none currently  CONSULTED AND AGREED WITH PLAN OF CARE: Patient  Gloris Manchester, OTR/L, CLT 06/18/2023, 1:21 PM

## 2023-06-25 ENCOUNTER — Ambulatory Visit
Admission: RE | Admit: 2023-06-25 | Discharge: 2023-06-25 | Disposition: A | Discharge status: Home / Self Care | Payer: 59 | Source: Ambulatory Visit | Attending: Internal Medicine | Admitting: Internal Medicine

## 2023-06-25 DIAGNOSIS — Z853 Personal history of malignant neoplasm of breast: Secondary | ICD-10-CM | POA: Diagnosis present

## 2023-06-25 DIAGNOSIS — R109 Unspecified abdominal pain: Secondary | ICD-10-CM | POA: Diagnosis present

## 2023-06-25 DIAGNOSIS — R197 Diarrhea, unspecified: Secondary | ICD-10-CM | POA: Insufficient documentation

## 2023-06-26 ENCOUNTER — Ambulatory Visit: Payer: 59 | Attending: Oncology | Admitting: Occupational Therapy

## 2023-06-26 DIAGNOSIS — I890 Lymphedema, not elsewhere classified: Secondary | ICD-10-CM | POA: Diagnosis present

## 2023-06-26 DIAGNOSIS — M6281 Muscle weakness (generalized): Secondary | ICD-10-CM | POA: Diagnosis present

## 2023-06-26 DIAGNOSIS — M79602 Pain in left arm: Secondary | ICD-10-CM | POA: Insufficient documentation

## 2023-06-26 DIAGNOSIS — M25622 Stiffness of left elbow, not elsewhere classified: Secondary | ICD-10-CM | POA: Diagnosis present

## 2023-06-26 NOTE — Therapy (Signed)
 OUTPATIENT OCCUPATIONAL THERAPY ORTHO TREATMENT  Patient Name: Leslie Bautista MRN: [id number] DOB:08/31/1960, 63 y.o., female  PCP: [facility] REFERRING PROVIDER: Myer Purchase  END OF SESSION:  OT End of Session - 06/26/23 0951     Visit Number 5    Number of Visits 8    Date for OT Re-Evaluation 06/29/23    OT Start Time 0949    OT Stop Time 1028    OT Time Calculation (min) 39 min    Activity Tolerance Patient tolerated treatment well    Behavior During Therapy WFL for tasks assessed/performed             Past Medical History:  Diagnosis Date   Cataract    Depression    Hypertension    Morbid obesity with BMI of 50.0-59.9, adult (HCC)    Obstructive sleep apnea    Osteoarthritis of both knees    Pre-diabetes    Past Surgical History:  Procedure Laterality Date   ANKLE SURGERY Right    ligament repair   CATARACT EXTRACTION, BILATERAL Bilateral 2022   COLONOSCOPY  2015   TONSILLECTOMY Bilateral 06/14/2021   Procedure: TONSILLECTOMY;  Surgeon: Milissa Hamming, MD;  Location: [facility] ORS;  Service: ENT;  Laterality: Bilateral;   TOTAL KNEE ARTHROPLASTY Right    TUBAL LIGATION     Patient Active Problem List   Diagnosis Date Noted   Chemotherapy-induced neutropenia (HCC) 12/05/2022   Thrombocytopenia (HCC) 12/05/2022   Lower urinary tract infectious disease 12/03/2022   Myocardial injury 12/03/2022   Obesity, Class III, BMI 40-49.9 (morbid obesity) (HCC) 12/03/2022   Breast cancer (HCC) 12/03/2022   HTN (hypertension) 12/03/2022   HLD (hyperlipidemia) 12/03/2022   OSA on CPAP 12/03/2022   Endometrial thickening on ultrasound 07/05/2021   Dyspareunia due to medical condition in female 10/24/2019   Postmenopause bleeding 03/08/2018   Pre-diabetes 11/22/2015   Depression 06/03/2014   Class 3 severe obesity with serious comorbidity and body mass index (BMI) of 50.0 to 59.9 in adult (HCC) 01/07/2014   Hypertension, benign 10/25/2011    Primary localized osteoarthrosis, lower leg 12/19/2007    ONSET DATE: December 2024  REFERRING DIAG: lymphedema Invasive ductal carcinoma of left breast  THERAPY DIAG:  Lymphedema, not elsewhere classified  Muscle weakness (generalized)  Stiffness of joint, upper arm, left  Pain in left arm  Rationale for Evaluation and Treatment: Rehabilitation  SUBJECTIVE:   SUBJECTIVE STATEMENT: I did not do my exercises this past week- I am tired [name]- I drive everybody around to school and work and pick up- I am the only driver in the house -   Pt accompanied by: self  PERTINENT HISTORY: Pt with complex medical history over the last year with recent cancer diagnosis.  She underwent surgery on 08/24/22 for a left lumpectomy with SLNB and b/l oncoplastic rearrangement of tissue with reduction. She underwent chemotherapy with last dose given on 12/19/22 after 3 cycles and overall poor tolerance.  Radiation to left breast with boost to tumor bed 02/19/2023 and completed on 03/16/2023.    PRECAUTIONS: None   WEIGHT BEARING RESTRICTIONS: No  PAIN:  Are you having pain?  Pull in  L lat trunk with shoulder abduction overhead.  3/10 FALLS: Has patient fallen in last 6 months? No  LIVING ENVIRONMENT: Lives with: lives with their family Lives in: House/apartment Has following equipment at home: None  PLOF: Independent  PATIENT GOALS: Pt would like to decrease the pain and stiffness of her left arm, decrease  the pulling towards her back   NEXT MD VISIT: TBD  OBJECTIVE:  Note: Objective measures were completed at Evaluation unless otherwise noted.  HAND DOMINANCE: Right  ADLs: Overall ADLs: Pt able to complete basic self care tasks however has pain in left UE, difficulty with reaching tasks. She reports she feels her left arm is thick and tight, pulling towards her back along scar line.  She currently wears a compression bra and has decreased sensation in the area of her surgery.  She is  currently not working.  She lives with her daughters and grandson.  She is able to engage in light homemaking tasks, cooking and is still able to drive.  Her daughters assist with heavier household chores.    FUNCTIONAL OUTCOME MEASURES: FOTO: TBD  UPPER EXTREMITY ROM:     Active ROM Right eval Left eval L 06/08/23 L 04/17/24  Shoulder flexion 150 128 160 170  Shoulder abduction 130 121 140 150 pull lat trunk  Shoulder adduction      Shoulder extension      Shoulder internal rotation      Shoulder external rotation      Elbow flexion WNL WNL    Elbow extension      Wrist flexion WNL WNL    Wrist extension      Wrist ulnar deviation      Wrist radial deviation      Wrist pronation WNL WNL    Wrist supination WNL WNL    (Blank rows = not tested)   UPPER EXTREMITY MMT:     MMT Right eval Left eval  Shoulder flexion    Shoulder abduction    Shoulder adduction    Shoulder extension    Shoulder internal rotation    Shoulder external rotation    Middle trapezius    Lower trapezius    Elbow flexion    Elbow extension    Wrist flexion    Wrist extension    Wrist ulnar deviation    Wrist radial deviation    Wrist pronation    Wrist supination    (Blank rows = not tested)  HAND FUNCTION: Grip strength: Right: 55 lbs; Left: 40 lbs, Lateral pinch: Right: 11 lbs, Left: 9 lbs, and 3 point pinch: Right: 7 lbs, Left: 5 lbs 06/26/23 Grip strength: Right: 55 lbs; Left: 43 lbs, Lateral pinch: Right: 14 lbs, Left: 14 lbs, and 3 point pinch: Right: 10 lbs, Left: 11 lbs  COORDINATION: Intact however 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     EDEMA: See flowsheet for circumferential measurements of bilateral UEs.  COGNITION: Overall cognitive status: Within functional limits for tasks assessed  TREATMENT DATE: 06/25/2022    Pt  reports tenderness and tightness and discomfort on the breast is much better.   Continues to feel mostly a pull on the lateral thoracic/ scapula with shoulder abduction overhead.   Manual Therapy:   Patient can start wearing  chip bag  about 4 days a week for only 4 hrs -  left lateral breast fibrosis looks great .     Done manual massage as well as mini massager with pt in R sidelying - on L latissimus dorsi muscle , pect and axilla ,at posterior shoulder with arm over head in shoulder ABD     Therapeutic Exercises:  Did assess patient's shoulder flexion and abduction coming  in.  Got stiff with not doing HEP the last week   Shoulder abduction worse than flexion .  Prior to manual therapy reviewed with patient shoulder flexion sitting in a chair with arms on table for latissimus dorsi stretch can repeat sideways left and right due to increased stretch 10 reps each - Report improvement with overhead motion  After manual therapy  After which done AAROM and contract and relax shoulder ABD sidelying- 15 reps And Open book sidelying  And protraction and retraction scapula sideying   Need mod A with last 2 exercises After which pt done  pect stretch on corner of wall  And AAROM shoulder flexion ABD on wall - 10 reps  With much improvement Pt to do daily at about 9h30 - put on schedule And red Thera-Band for patient for scapular retraction as well as shoulder extension 2 sets of 12 1 time a day. Encourage patient to do stretches    PATIENT EDUCATION: Education details: ROM exercises for bilateral UEs, with focus on left.  Scar massage, gentle massage around breast and towards back where she has the most tightness and stiffness. Person educated: Patient Education method: Medical Illustrator Education comprehension: verbalized understanding and returned demonstration  HOME EXERCISE PROGRAM: Pt to perform gentle range of motion and stretching exercises at home with assistance from her  daughters to massage along scar line and areas of tightness which are limiting her current motion in her arm.  She reports feeling of thickness however after treatment this date she reported area felt much better and pain decreased.    GOALS: Goals reviewed with patient? Yes  SHORT TERM GOALS: Target date: 06/01/2023  Pt to demonstrate understanding of home exercise program with modified independence. Baseline: no current program at eval. Goal status: INITIAL   LONG TERM GOALS: Target date: 06/29/2023  Pt will demonstrate decrease in pain, 2/10 or less in her left UE during self care tasks. Baseline: currently increased pain and tightness in left UE which makes her self care tasks difficult Goal status: INITIAL  2.  Pt to demonstrate improved left shoulder flexion by 10 degrees to facilitate reaching to obtain items from closet and shelves. Baseline: difficulty with reach at eval Goal status: INITIAL  3.  Pt to demonstrate understanding of home program to manage symptoms of lymphedema including use of compression garments and/or pump if needed. Baseline: no current knowledge. Goal status: INITIAL  ASSESSMENT:  CLINICAL IMPRESSION: Patient is a 63 y.o. female who was seen today for occupational therapy evaluation for lymphedema of left UE with pain in left arm. Pt is s/p lumpectomy in April of 2024 followed by 3 rounds of chemotherapy and 20 radiation treatments.  She also underwent a breast reduction on the right at the same time of surgery.  Pt presents with left arm and back pain which limits her active range of motion and resulting in decreased ability to perform reaching tasks, ADL and IADL tasks.  Patient arrive with increased shoulder flexion,abduction and external rotation.  Pt report not doing HEP the last week- was on emotionally in good place -driving everybody to work and school - just tired- Patient continued to have pull or stretch with shoulder abduction mostly now on  lateral thoracic.  Review some stretches for latissimus dorsi as well as soft tissue massage again - ollowed by great improvement.  Pt to do HEP every AM around 9h30 this coming week- new stretches as well as red Thera-Band for scapular retraction and shoulder  extension. And then do something for herself - visit [employer] and start craft.  Great improvement with pain and tenderness in breast.  Pt to continue with HEP and follow up in 1-2 weeks. She will benefit from skilled OT intervention to maximize safety and independence in necessary daily tasks.    PERFORMANCE DEFICITS: in functional skills including ADLs, IADLs, coordination, dexterity, sensation, edema, ROM, strength, pain, fascial restrictions, flexibility, decreased knowledge of use of DME, and UE functional use,  and psychosocial skills including environmental adaptation, habits, and routines and behaviors.   IMPAIRMENTS: are limiting patient from ADLs, IADLs, rest and sleep, and social participation.   COMORBIDITIES: may have co-morbidities  that affects occupational performance. Patient will benefit from skilled OT to address above impairments and improve overall function.  MODIFICATION OR ASSISTANCE TO COMPLETE EVALUATION: Min-Moderate modification of tasks or assist with assess necessary to complete an evaluation.  OT OCCUPATIONAL PROFILE AND HISTORY: Detailed assessment: Review of records and additional review of physical, cognitive, psychosocial history related to current functional performance.  CLINICAL DECISION MAKING: Moderate - several treatment options, min-mod task modification necessary  REHAB POTENTIAL: Good  EVALUATION COMPLEXITY: Moderate   PLAN:  OT FREQUENCY: 1x/week  OT DURATION: 8 weeks  PLANNED INTERVENTIONS: 97168 OT Re-evaluation, 97535 self care/ADL training, 02889 therapeutic exercise, 97530 therapeutic activity, 97112 neuromuscular re-education, 97140 manual therapy, manual lymph drainage, passive range of  motion, compression bandaging, coping strategies training, patient/family education, and DME and/or AE instructions  RECOMMENDED OTHER SERVICES: none currently  CONSULTED AND AGREED WITH PLAN OF CARE: Patient  [name] tedra Ip, OTR/L, CLT 06/26/2023, 10:38 AM

## 2023-06-29 ENCOUNTER — Encounter: Payer: Self-pay | Admitting: Family Medicine

## 2023-06-29 ENCOUNTER — Ambulatory Visit: Payer: 59 | Admitting: Family Medicine

## 2023-06-29 VITALS — BP 138/65 | HR 68 | Resp 18 | Ht 63.0 in | Wt 288.9 lb

## 2023-06-29 DIAGNOSIS — Z6841 Body Mass Index (BMI) 40.0 and over, adult: Secondary | ICD-10-CM

## 2023-06-29 DIAGNOSIS — E785 Hyperlipidemia, unspecified: Secondary | ICD-10-CM | POA: Diagnosis not present

## 2023-06-29 DIAGNOSIS — N823 Fistula of vagina to large intestine: Secondary | ICD-10-CM

## 2023-06-29 DIAGNOSIS — Z7689 Persons encountering health services in other specified circumstances: Secondary | ICD-10-CM | POA: Insufficient documentation

## 2023-06-29 DIAGNOSIS — I10 Essential (primary) hypertension: Secondary | ICD-10-CM | POA: Diagnosis not present

## 2023-06-29 DIAGNOSIS — R198 Other specified symptoms and signs involving the digestive system and abdomen: Secondary | ICD-10-CM

## 2023-06-29 DIAGNOSIS — R051 Acute cough: Secondary | ICD-10-CM

## 2023-06-29 DIAGNOSIS — G629 Polyneuropathy, unspecified: Secondary | ICD-10-CM | POA: Insufficient documentation

## 2023-06-29 DIAGNOSIS — G8929 Other chronic pain: Secondary | ICD-10-CM | POA: Insufficient documentation

## 2023-06-29 DIAGNOSIS — M79605 Pain in left leg: Secondary | ICD-10-CM

## 2023-06-29 DIAGNOSIS — R7303 Prediabetes: Secondary | ICD-10-CM

## 2023-06-29 DIAGNOSIS — Z130 Encounter for screening for diseases of the blood and blood-forming organs and certain disorders involving the immune mechanism: Secondary | ICD-10-CM | POA: Insufficient documentation

## 2023-06-29 DIAGNOSIS — Z1159 Encounter for screening for other viral diseases: Secondary | ICD-10-CM | POA: Insufficient documentation

## 2023-06-29 DIAGNOSIS — E66813 Obesity, class 3: Secondary | ICD-10-CM

## 2023-06-29 DIAGNOSIS — M79604 Pain in right leg: Secondary | ICD-10-CM

## 2023-06-29 MED ORDER — BENZONATATE 100 MG PO CAPS: 100.0000 mg | ORAL_CAPSULE | Freq: Three times a day (TID) | ORAL | 0 refills | Status: DC | PRN

## 2023-06-29 NOTE — Progress Notes (Signed)
 New Patient Office Visit  Introduced to nurse practitioner role and practice setting.  All questions answered.  Discussed provider/patient relationship and expectations.   Subjective    Patient ID: Leslie Bautista, female    DOB: 1961/03/24  Age: 63 y.o. MRN: [id number]  CC:  Chief Complaint  Patient presents with   Establish Care    \  HPI Leslie Bautista presents to establish care.  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  She experiences intermittent episodes where stool seems to pass through her vaginal canal during bowel movements, 1-2 times weekly. She also describes a sensation of rectal pressure, feeling as if something is 'stuck' until she defecates. She associates these symptoms with her previous radiation treatment for breast cancer. No current symptoms of infection such as discharge or burning during urination.  She has a history of breast cancer, diagnosed on July 14, 2022, and is currently in remission. She underwent a L lumpectomy and breast reduction, with cancer found in the left breast. She completed chemotherapy and radiation, with her last radiation session on March 16, 2023. She follows up with oncology every three months and takes anastrozole 1 mg daily.  She experiences neuropathy, which she attributes to her cancer treatment. She takes gabapentin  300 mg and 600 mg at different times of the day, and nortriptyline, prescribed by her neurologist. She feels that her neurologist has not adequately addressed her concerns.  She has a persistent cough for the past three weeks, for which she was previously prescribed benzonatate . She requests a refill as the cough persists.  She has a history of hypertension, managed with losartan  50 mg daily. She also has osteoarthritis, with a right knee replacement, and takes meloxicam  7.5 mg as  needed for general pain.  She has sleep apnea, for which she uses a CPAP machine, although she reports it is not currently working well - going to take it to DME store.  She is currently taking both omeprazole and pantoprazole - ENT recommendations.   Outpatient Encounter Medications as of 06/29/2023  Medication Sig   anastrozole (ARIMIDEX) 1 MG tablet Take 1 mg by mouth daily.   atorvastatin  (LIPITOR) 20 MG tablet Take 20 mg by mouth at bedtime.   gabapentin  (NEURONTIN ) 300 MG capsule Take 300 mg by mouth. Take 1 capsule in the evening and 2 capsules nightly. May take additional 1 capsule nightly as needed   gabapentin  (NEURONTIN ) 600 MG tablet Take 600 mg by mouth 2 (two) times daily.   losartan  (COZAAR ) 50 MG tablet Take 50 mg by mouth daily.   meloxicam  (MOBIC ) 7.5 MG tablet Take 7.5 mg by mouth 2 (two) times daily.   nortriptyline (PAMELOR) 10 MG capsule Take 20 mg by mouth at bedtime.   omeprazole (PRILOSEC) 20 MG capsule Take 20 mg by mouth daily.   pantoprazole  (PROTONIX ) 40 MG tablet Take 1 tablet (40 mg total) by mouth daily for 7 days.   traZODone  (DESYREL ) 100 MG tablet Take 100 mg by mouth at bedtime.   [DISCONTINUED] benzonatate  (TESSALON ) 100 MG capsule Take 100 mg by mouth 3 (three) times daily as needed for cough.   [DISCONTINUED] gabapentin  (NEURONTIN ) 300 MG capsule Take 900 mg by mouth at bedtime.   [DISCONTINUED] losartan  (COZAAR ) 50 MG tablet Take 0.5 tablets (25 mg total) by mouth daily. (Patient taking differently: Take 50 mg by  mouth daily.)   benzonatate  (TESSALON ) 100 MG capsule Take 1 capsule (100 mg total) by mouth 3 (three) times daily as needed for cough.   [DISCONTINUED] albuterol  (VENTOLIN  HFA) 108 (90 Base) MCG/ACT inhaler Inhale 2 puffs into the lungs every 6 (six) hours as needed for wheezing or shortness of breath.   [DISCONTINUED] hydrOXYzine  (ATARAX ) 25 MG tablet Take 25 mg by mouth at bedtime.   [DISCONTINUED] ondansetron  (ZOFRAN ) 8 MG tablet Take 8 mg by  mouth every 8 (eight) hours as needed for nausea or vomiting.   [DISCONTINUED] polyethylene glycol (MIRALAX  / GLYCOLAX ) 17 g packet Take 17 g by mouth daily.   [DISCONTINUED] prochlorperazine (COMPAZINE) 10 MG tablet Take 10 mg by mouth every 6 (six) hours as needed for nausea.   [DISCONTINUED] sertraline  (ZOLOFT ) 50 MG tablet Take 50 mg by mouth daily.   No facility-administered encounter medications on file as of 06/29/2023.    Past Medical History:  Diagnosis Date   Allergy    Cancer (HCC)    Cataract    Depression    Hypertension    Morbid obesity with BMI of 50.0-59.9, adult (HCC)    Obstructive sleep apnea    Osteoarthritis of both knees    Pre-diabetes    Sleep apnea     Past Surgical History:  Procedure Laterality Date   ANKLE SURGERY Right    ligament repair   BREAST SURGERY     CATARACT EXTRACTION, BILATERAL Bilateral 2022   COLONOSCOPY  2015   EYE SURGERY     JOINT REPLACEMENT Right    Knee   TONSILLECTOMY Bilateral 06/14/2021   Procedure: TONSILLECTOMY;  Surgeon: Milissa Hamming, MD;  Location: [facility] ORS;  Service: ENT;  Laterality: Bilateral;   TOTAL KNEE ARTHROPLASTY Right    TUBAL LIGATION      Family History  Problem Relation Age of Onset   Pancreatic cancer Mother    Prostate cancer Father    Hypertension Sister    Hypertension Brother     Social History   Socioeconomic History   Marital status: Single    Spouse name: Not on file   Number of children: Not on file   Years of education: Not on file   Highest education level: Not on file  Occupational History   Not on file  Tobacco Use   Smoking status: Never   Smokeless tobacco: Never  Vaping Use   Vaping status: Never Used  Substance and Sexual Activity   Alcohol use: Not Currently    Comment: rarely   Drug use: No   Sexual activity: Not Currently    Birth control/protection: Post-menopausal  Other Topics Concern   Not on file  Social History Narrative   Lives with daughters and  grand child   Social Drivers of Health   Financial Resource Strain: Low Risk  (05/18/2023)   Received from [facility]   Overall Financial Resource Strain (CARDIA)    Difficulty of Paying Living Expenses: Not very hard  Recent Concern: Financial Resource Strain - High Risk (04/30/2023)   Received from [facility]   Overall Financial Resource Strain (CARDIA)    Difficulty of Paying Living Expenses: Very hard  Food Insecurity: No Food Insecurity (05/18/2023)   Received from [facility]   Hunger Vital Sign    Worried About Running Out of Food in the Last Year: Never true    Ran Out of Food in the Last Year: Never true  Recent Concern: Food  Insecurity - Food Insecurity Present (04/30/2023)   Received from [facility]   Hunger Vital Sign    Worried About Running Out of Food in the Last Year: Sometimes true    Ran Out of Food in the Last Year: Sometimes true  Transportation Needs: No Transportation Needs (05/18/2023)   Received from [facility] - Transportation    In the past 12 months, has lack of transportation kept you from medical appointments or from getting medications?: No    Lack of Transportation (Non-Medical): No  Physical Activity: Not on file  Stress: No Stress Concern Present (03/29/2020)   Received from [facility]   [facility] - Occupational Stress Questionnaire    Feeling of Stress : Only a little  Social Connections: Not on file  Intimate Partner Violence: Not At Risk (12/03/2022)   Humiliation, Afraid, Rape, and Kick questionnaire    Fear of Current or Ex-Partner: No    Emotionally Abused: No    Physically Abused: No    Sexually Abused: No    Review of Systems  All other systems reviewed and are negative.       Objective    BP 138/65   Pulse 68   Resp 18   Ht 5' 3 (1.6 m)   Wt 288 lb 14.4 oz (131 kg)   LMP 10/15/2019 Comment:  Patient states there is no way she is pregnant.   SpO2 100%   BMI 51.18 kg/m   Physical Exam Constitutional:      General: She is not in acute distress.    Appearance: Normal appearance. She is obese. She is not toxic-appearing or diaphoretic.  HENT:     Head: Normocephalic.     Nose: Nose normal.     Mouth/Throat:     Mouth: Mucous membranes are moist.     Pharynx: Oropharynx is clear.  Eyes:     Extraocular Movements: Extraocular movements intact.     Pupils: Pupils are equal, round, and reactive to light.  Cardiovascular:     Rate and Rhythm: Normal rate and regular rhythm.     Pulses: Normal pulses.          Radial pulses are 2+ on the right side and 2+ on the left side.       Dorsalis pedis pulses are 2+ on the right side and 2+ on the left side.       Posterior tibial pulses are 2+ on the right side and 2+ on the left side.     Heart sounds: Normal heart sounds. No murmur heard.    No friction rub. No gallop.  Pulmonary:     Effort: No respiratory distress.     Breath sounds: No stridor. No wheezing, rhonchi or rales.  Chest:     Chest wall: No tenderness.  Musculoskeletal:     Right lower leg: No edema.     Left lower leg: No edema.  Skin:    General: Skin is warm and dry.     Capillary Refill: Capillary refill takes less than 2 seconds.  Neurological:     General: No focal deficit present.     Mental Status: She is alert and oriented to person, place, and time. Mental status is at baseline.  Psychiatric:        Mood and Affect: Mood normal.        Behavior: Behavior normal.  Thought Content: Thought content normal.        Judgment: Judgment normal.         Assessment & Plan:   Rectal vaginal fistula Assessment & Plan: Reports fecal matter passing through the vagina.  Pt believes complication from radiation therapy for breast cancer.  No current signs of infection. Decline physical GU/rectal exam today -Refer to both GYN and Gastroenterology for  further evaluation and management.  Orders: -     Ambulatory referral to Gastroenterology -     Ambulatory referral to Obstetrics / Gynecology  Need for hepatitis C screening test -     Hepatitis C antibody  Hyperlipidemia, unspecified hyperlipidemia type Assessment & Plan: On Atorvastatin  20 mg daily Will check Fasting LP  Orders: -     Lipid panel -     Comprehensive metabolic panel  Primary hypertension Assessment & Plan: Chronic, stable GOAL<130/80 Mild elevation today Continue losartan  50mg  daily Low sodium diet, exercise, weight loss recommended Will check CMP today   Orders: -     Comprehensive metabolic panel  Class 3 severe obesity without serious comorbidity with body mass index (BMI) of 50.0 to 59.9 in adult, unspecified obesity type (HCC) Assessment & Plan: Recommend increasing daily exercise - 150 minutes per week to increase heart rate Well balanced diet Will check TSH, A1C, and lipid  Orders: -     Hemoglobin A1c  Screening for deficiency anemia -     CBC with Differential/Platelet  Tenesmus (rectal) Assessment & Plan: Subacute on chronic feeling of stool stuck, or lump at end of colon/rectum Concurrently with rectal-vaginal fistula concerns Hx of breast cancer Referral to GI  Orders: -     Ambulatory referral to Gastroenterology  Chronic pain of both lower extremities Assessment & Plan: Chronic Pt feels it could be related to vascular issues Pulses palpable during exam No swelling, erythema, warmth or pain to touch Will order doppler venous and arterial of bilater lower extremities to rule out any vascular concerns  Orders: -     US  ARTERIAL LOWER EXTREMITY DUPLEX BILATERAL; Future -     US  Venous Img Lower Bilateral (DVT); Future  Acute cough Assessment & Plan: Residual cough from viral infection 3 week ago Wanted refill on tessalon  tablets to help alleviate cough Will reorder Encourage hot tea with hone Throat  lozenges Humidification   Orders: -     Benzonatate ; Take 1 capsule (100 mg total) by mouth 3 (three) times daily as needed for cough.  Dispense: 20 capsule; Refill: 0  Encounter to establish care with new doctor Assessment & Plan: Concerns today: rectal vaginal fistula and chronic neuropathy/ leg pain Routine labs F/u 4 months   Neuropathy Assessment & Plan: Chronic, see neurology On gabapentin , nortriptyline, trazodone , and meloxicam   Pt concern she has not been fully worked up  She is worried something is going on vascularly because her neuropathy is not improving Will order LE doppler both arterial and venous Continue to f/u with specialist    Pre-diabetes Assessment & Plan: Will check A1c Increase exercise, weightloss, increase proteins, fruits, veggies, decrease processed foods, starches, saturated fats.   Will communicate lab results  Return in about 4 months (around 10/27/2023), or chronic disease mgmt.   I, Curtis DELENA Boom, FNP, have reviewed all documentation for this visit. The documentation on 06/29/23 for the exam, diagnosis, procedures, and orders are all accurate and complete.   Curtis DELENA Boom, FNP

## 2023-06-29 NOTE — Assessment & Plan Note (Signed)
 Will check A1c Increase exercise, weightloss, increase proteins, fruits, veggies, decrease processed foods, starches, saturated fats.

## 2023-06-29 NOTE — Assessment & Plan Note (Signed)
 Chronic, stable GOAL<130/80 Mild elevation today Continue losartan  50mg  daily Low sodium diet, exercise, weight loss recommended Will check CMP today

## 2023-06-29 NOTE — Assessment & Plan Note (Signed)
 Chronic Pt feels it could be related to vascular issues Pulses palpable during exam No swelling, erythema, warmth or pain to touch Will order doppler venous and arterial of bilater lower extremities to rule out any vascular concerns

## 2023-06-29 NOTE — Assessment & Plan Note (Signed)
 On Atorvastatin  20 mg daily Will check Fasting LP

## 2023-06-29 NOTE — Assessment & Plan Note (Addendum)
 Subacute on chronic feeling of stool stuck, or "lump" at end of colon/rectum Concurrently with rectal-vaginal fistula concerns Hx of breast cancer Referral to GI

## 2023-06-29 NOTE — Assessment & Plan Note (Signed)
 Recommend increasing daily exercise - 150 minutes per week to increase heart rate Well balanced diet Will check TSH, A1C, and lipid

## 2023-06-29 NOTE — Assessment & Plan Note (Signed)
 Reports fecal matter passing through the vagina.  Pt believes complication from radiation therapy for breast cancer.  No current signs of infection. Decline physical GU/rectal exam today -Refer to both GYN and Gastroenterology for further evaluation and management.

## 2023-06-29 NOTE — Assessment & Plan Note (Signed)
 Concerns today: rectal vaginal fistula and chronic neuropathy/ leg pain Routine labs F/u 4 months

## 2023-06-29 NOTE — Assessment & Plan Note (Signed)
 Chronic, see neurology On gabapentin , nortriptyline, trazodone , and meloxicam   Pt concern she has not been fully worked up  She is worried something is going on vascularly because her neuropathy is not improving Will order LE doppler both arterial and venous Continue to f/u with specialist

## 2023-06-29 NOTE — Assessment & Plan Note (Signed)
 Residual cough from viral infection 3 week ago Wanted refill on tessalon  tablets to help alleviate cough Will reorder Encourage hot tea with hone Throat lozenges Humidification

## 2023-06-30 LAB — COMPREHENSIVE METABOLIC PANEL
ALT: 12 [IU]/L (ref 0–32)
AST: 19 [IU]/L (ref 0–40)
Albumin: 4 g/dL (ref 3.9–4.9)
Alkaline Phosphatase: 116 [IU]/L (ref 44–121)
BUN/Creatinine Ratio: 16 (ref 12–28)
BUN: 18 mg/dL (ref 8–27)
Bilirubin Total: 0.4 mg/dL (ref 0.0–1.2)
CO2: 26 mmol/L (ref 20–29)
Calcium: 9.8 mg/dL (ref 8.7–10.3)
Chloride: 105 mmol/L (ref 96–106)
Creatinine, Ser: 1.15 mg/dL — ABNORMAL HIGH (ref 0.57–1.00)
Globulin, Total: 3.1 g/dL (ref 1.5–4.5)
Glucose: 105 mg/dL — ABNORMAL HIGH (ref 70–99)
Potassium: 4.6 mmol/L (ref 3.5–5.2)
Sodium: 144 mmol/L (ref 134–144)
Total Protein: 7.1 g/dL (ref 6.0–8.5)
eGFR: 54 mL/min/{1.73_m2} — ABNORMAL LOW (ref >59)

## 2023-06-30 LAB — LIPID PANEL
Chol/HDL Ratio: 2.1 {ratio} (ref 0.0–4.4)
Cholesterol, Total: 143 mg/dL (ref 100–199)
HDL: 68 mg/dL (ref >39)
LDL Chol Calc (NIH): 63 mg/dL (ref 0–99)
Triglycerides: 59 mg/dL (ref 0–149)
VLDL Cholesterol Cal: 12 mg/dL (ref 5–40)

## 2023-06-30 LAB — CBC WITH DIFFERENTIAL/PLATELET
Basophils Absolute: 0 10*3/uL (ref 0.0–0.2)
Basos: 0 %
EOS (ABSOLUTE): 0.1 10*3/uL (ref 0.0–0.4)
Eos: 2 %
Hematocrit: 35.7 % (ref 34.0–46.6)
Hemoglobin: 11.7 g/dL (ref 11.1–15.9)
Immature Grans (Abs): 0 10*3/uL (ref 0.0–0.1)
Immature Granulocytes: 0 %
Lymphocytes Absolute: 1.3 10*3/uL (ref 0.7–3.1)
Lymphs: 28 %
MCH: 27.9 pg (ref 26.6–33.0)
MCHC: 32.8 g/dL (ref 31.5–35.7)
MCV: 85 fL (ref 79–97)
Monocytes Absolute: 0.4 10*3/uL (ref 0.1–0.9)
Monocytes: 8 %
Neutrophils Absolute: 2.9 10*3/uL (ref 1.4–7.0)
Neutrophils: 62 %
Platelets: 165 10*3/uL (ref 150–450)
RBC: 4.2 x10E6/uL (ref 3.77–5.28)
RDW: 14.8 % (ref 11.7–15.4)
WBC: 4.8 10*3/uL (ref 3.4–10.8)

## 2023-06-30 LAB — HEMOGLOBIN A1C
Est. average glucose Bld gHb Est-mCnc: 140 mg/dL
Hgb A1c MFr Bld: 6.5 % — ABNORMAL HIGH (ref 4.8–5.6)

## 2023-06-30 LAB — HEPATITIS C ANTIBODY: Hep C Virus Ab: NONREACTIVE

## 2023-07-02 ENCOUNTER — Other Ambulatory Visit: Payer: Self-pay | Admitting: Family Medicine

## 2023-07-02 ENCOUNTER — Ambulatory Visit: Payer: 59 | Admitting: Occupational Therapy

## 2023-07-02 ENCOUNTER — Encounter: Payer: Self-pay | Admitting: Family Medicine

## 2023-07-02 DIAGNOSIS — N1831 Chronic kidney disease, stage 3a: Secondary | ICD-10-CM

## 2023-07-02 MED ORDER — METFORMIN HCL ER 500 MG PO TB24: 500.0000 mg | ORAL_TABLET | Freq: Every day | ORAL | 3 refills | Status: DC

## 2023-07-03 ENCOUNTER — Telehealth: Payer: Self-pay

## 2023-07-03 ENCOUNTER — Encounter: Payer: Self-pay | Admitting: Family Medicine

## 2023-07-03 ENCOUNTER — Other Ambulatory Visit: Payer: Self-pay | Admitting: Family Medicine

## 2023-07-03 NOTE — Telephone Encounter (Unsigned)
Copied from CRM [id number]. Topic: General - Other >> Jul 03, 2023  9:28 AM Clide Dales wrote: Patient states that she was given Metformin last week, but she cannot take it because it gives her diarrhea. Patient is requesting alternative medication. Please advise.

## 2023-07-05 ENCOUNTER — Other Ambulatory Visit: Payer: Self-pay | Admitting: Family Medicine

## 2023-07-06 ENCOUNTER — Ambulatory Visit: Payer: 59 | Admitting: Occupational Therapy

## 2023-07-06 ENCOUNTER — Other Ambulatory Visit: Payer: Self-pay | Admitting: Family Medicine

## 2023-07-06 DIAGNOSIS — I890 Lymphedema, not elsewhere classified: Secondary | ICD-10-CM

## 2023-07-06 DIAGNOSIS — M25622 Stiffness of left elbow, not elsewhere classified: Secondary | ICD-10-CM

## 2023-07-06 DIAGNOSIS — M6281 Muscle weakness (generalized): Secondary | ICD-10-CM

## 2023-07-06 DIAGNOSIS — E1165 Type 2 diabetes mellitus with hyperglycemia: Secondary | ICD-10-CM

## 2023-07-06 DIAGNOSIS — M79602 Pain in left arm: Secondary | ICD-10-CM

## 2023-07-06 MED ORDER — SEMAGLUTIDE 7 MG PO TABS: 7.0000 mg | ORAL_TABLET | Freq: Every day | ORAL | 3 refills | Status: DC

## 2023-07-06 MED ORDER — RYBELSUS 3 MG PO TABS: 3.0000 mg | ORAL_TABLET | Freq: Every day | ORAL | 0 refills | Status: DC

## 2023-07-06 NOTE — Therapy (Signed)
OUTPATIENT OCCUPATIONAL THERAPY ORTHO TREATMENT/Recert  Patient Name: Leslie Bautista MRN: [id number] DOB:[birth year], 63 y.o., female  PCP: [facility] REFERRING PROVIDER: Hewitt Blade  END OF SESSION:  OT End of Session - 07/06/23 0955     Visit Number 6    Number of Visits 12    Date for OT Re-Evaluation 08/17/23    OT Start Time 0954    OT Stop Time 1038    OT Time Calculation (min) 44 min    Activity Tolerance Patient tolerated treatment well    Behavior During Therapy WFL for tasks assessed/performed             Past Medical History:  Diagnosis Date   Allergy    Cancer (HCC)    Cataract    Depression    Hypertension    Morbid obesity with BMI of 50.0-59.9, adult (HCC)    Obstructive sleep apnea    Osteoarthritis of both knees    Pre-diabetes    Sleep apnea    Past Surgical History:  Procedure Laterality Date   ANKLE SURGERY Right    ligament repair   BREAST SURGERY     CATARACT EXTRACTION, BILATERAL Bilateral 2022   COLONOSCOPY  2015   EYE SURGERY     JOINT REPLACEMENT Right    Knee   TONSILLECTOMY Bilateral 06/14/2021   Procedure: TONSILLECTOMY;  Surgeon: Bud Face, MD;  Location: [facility] ORS;  Service: ENT;  Laterality: Bilateral;   TOTAL KNEE ARTHROPLASTY Right    TUBAL LIGATION     Patient Active Problem List   Diagnosis Date Noted   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   Tenesmus (rectal) 06/29/2023   Chronic pain of both lower extremities 06/29/2023   Acute cough 06/29/2023   Need for hepatitis C screening test 06/29/2023   Screening for deficiency anemia 06/29/2023   Encounter to establish care with new doctor 06/29/2023   Neuropathy 06/29/2023   Chemotherapy-induced neutropenia (HCC) 12/05/2022   Thrombocytopenia (HCC) 12/05/2022   Lower urinary tract infectious disease 12/03/2022   Myocardial injury 12/03/2022   Obesity, Class III, BMI 40-49.9 (morbid obesity) (HCC) 12/03/2022   Breast cancer (HCC)  12/03/2022   HTN (hypertension) 12/03/2022   HLD (hyperlipidemia) 12/03/2022   OSA on CPAP 12/03/2022   Endometrial thickening on ultrasound 07/05/2021   Dyspareunia due to medical condition in female 10/24/2019   Postmenopause bleeding 03/08/2018   Pre-diabetes 11/22/2015   Depression 06/03/2014   Class 3 severe obesity without serious comorbidity with body mass index (BMI) of 50.0 to 59.9 in adult (HCC) 01/07/2014   Hypertension, benign 10/25/2011   Primary localized osteoarthrosis, lower leg 12/19/2007    ONSET DATE: December 2024  REFERRING DIAG: lymphedema Invasive ductal carcinoma of left breast  THERAPY DIAG:  Lymphedema, not elsewhere classified  Muscle weakness (generalized)  Stiffness of joint, upper arm, left  Pain in left arm  Rationale for Evaluation and Treatment: Rehabilitation  SUBJECTIVE:   SUBJECTIVE STATEMENT: I did take time for myself after I dropped every body off and done the exercises and in the evening in again - but stays tight down here in my armpit and side - and then I work it few times   Pt accompanied by: self  PERTINENT HISTORY: Pt with complex medical history over the last year with recent cancer diagnosis.  She underwent surgery on 08/24/22 for a left lumpectomy with SLNB and b/l oncoplastic rearrangement of tissue with reduction. She underwent chemotherapy with last dose given on 12/19/22 after 3  cycles and overall poor tolerance.  Radiation to left breast with boost to tumor bed 02/19/2023 and completed on 03/16/2023.    PRECAUTIONS: None   WEIGHT BEARING RESTRICTIONS: No  PAIN:  Are you having pain?  Pull in  L lat trunk  and axilla with shoulder abduction overhead.  3/10 FALLS: Has patient fallen in last 6 months? No  LIVING ENVIRONMENT: Lives with: lives with their family Lives in: House/apartment Has following equipment at home: None  PLOF: Independent  PATIENT GOALS: Pt would like to decrease the pain and stiffness of her  left arm, decrease the pulling towards her back   NEXT MD VISIT: TBD  OBJECTIVE:  Note: Objective measures were completed at Evaluation unless otherwise noted.  HAND DOMINANCE: Right  ADLs: Overall ADLs: Pt able to complete basic self care tasks however has pain in left UE, difficulty with reaching tasks. She reports she feels her left arm is thick and tight, pulling towards her back along scar line.  She currently wears a compression bra and has decreased sensation in the area of her surgery.  She is currently not working.  She lives with her daughters and grandson.  She is able to engage in light homemaking tasks, cooking and is still able to drive.  Her daughters assist with heavier household chores.    FUNCTIONAL OUTCOME MEASURES: FOTO: TBD  UPPER EXTREMITY ROM:     Active ROM Right eval Left eval L 06/08/23 L 06/18/23 L 07/06/23  Shoulder flexion 150 128 160 170 140 coming in  Shoulder abduction 130 121 140 150 pull lat trunk 135 coming in   Shoulder adduction       Shoulder extension       Shoulder internal rotation       Shoulder external rotation       Elbow flexion WNL WNL     Elbow extension       Wrist flexion WNL WNL     Wrist extension       Wrist ulnar deviation       Wrist radial deviation       Wrist pronation WNL WNL     Wrist supination WNL WNL     (Blank rows = not tested)   UPPER EXTREMITY MMT:     MMT Right eval Left eval  Shoulder flexion    Shoulder abduction    Shoulder adduction    Shoulder extension    Shoulder internal rotation    Shoulder external rotation    Middle trapezius    Lower trapezius    Elbow flexion    Elbow extension    Wrist flexion    Wrist extension    Wrist ulnar deviation    Wrist radial deviation    Wrist pronation    Wrist supination    (Blank rows = not tested)  HAND FUNCTION: Grip strength: Right: 55 lbs; Left: 40 lbs, Lateral pinch: Right: 11 lbs, Left: 9 lbs, and 3 point pinch: Right: 7 lbs, Left: 5  lbs 06/26/23 Grip strength: Right: 55 lbs; Left: 43 lbs, Lateral pinch: Right: 14 lbs, Left: 14 lbs, and 3 point pinch: Right: 10 lbs, Left: 11 lbs  COORDINATION: Intact however 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     EDEMA: See flowsheet for circumferential measurements of bilateral UEs.  COGNITION: Overall cognitive status: Within functional limits for tasks  assessed  TREATMENT DATE: 07/05/2022    Pt reports tenderness and tightness and discomfort on the breast is much better.   Continues to feel mostly a pull  at pect muscle and axilla into  lateral thoracic with shoulder abduction overhead.   Manual Therapy:   Patient can start weaning  chip bag  about 4 days a week for only 4 hrs -  left lateral breast fibrosis improved greatly .     Done manual massage as well as mini massager with pt in R sidelying -  on 3 to 6 o'clock L breast into L axilla and  lat dorsi muscle  with L  arm over head in shoulder ABD  Great improvement in ABD and contract and relax full motion - pain free     Therapeutic Exercises:  REINFORCE FOR Pt to do HEP 3 X day and not to do AROM sitting  Had to do stretches  This date review and ADD pulley's for shoulder flexion and ABD - 20 reps each  Provided to do at home 3 x  Followed by AAROM rolling ball on wall for flexion bilateral  And ABD L UE on wall  12 -15 reps Painfree  Pt to cont and done   pect stretch in corner of wall  Cont cont with supine AAROM using wand as needed   And red Thera-Band for patient for scapular retraction as well as shoulder extension 2 sets of 12 1 time a day. Encourage patient to do stretches    PATIENT EDUCATION: Education details: ROM exercises for bilateral UEs, with focus on left.  Scar massage, gentle massage around breast and towards back where she has the most tightness  and stiffness. Person educated: Patient Education method: Medical illustrator Education comprehension: verbalized understanding and returned demonstration  HOME EXERCISE PROGRAM: Pt to perform gentle range of motion and stretching exercises at home with assistance from her daughters to massage along scar line and areas of tightness which are limiting her current motion in her arm.  She reports feeling of "thickness" however after treatment this date she reported area felt much better and pain decreased.    GOALS: Goals reviewed with patient? Yes  SHORT TERM GOALS: Target date: 06/01/2023  Pt to demonstrate understanding of home exercise program with modified independence. Baseline: no current program at eval. Goal status: INITIAL   LONG TERM GOALS: Target date: 06/29/2023  Pt will demonstrate decrease in pain, 2/10 or less in her left UE during self care tasks. Baseline: currently increased pain and tightness in left UE which makes her self care tasks difficult Goal status: INITIAL  2.  Pt to demonstrate improved left shoulder flexion by 10 degrees to facilitate reaching to obtain items from closet and shelves. Baseline: difficulty with reach at eval Goal status: INITIAL  3.  Pt to demonstrate understanding of home program to manage symptoms of lymphedema including use of compression garments and/or pump if needed. Baseline: no current knowledge. Goal status: INITIAL  ASSESSMENT:  CLINICAL IMPRESSION: Patient is a 63 y.o. female who was seen for occupational therapy for lymphedema of left UE with pain in left arm. Pt is s/p lumpectomy in April of 2024 followed by 3 rounds of chemotherapy and 20 radiation treatments.  She also underwent a breast reduction on the right at the same time of surgery.  Pt presents with left arm and back pain which limits her active range of motion and resulting in decreased ability to perform reaching tasks, ADL  and IADL tasks.  Pt was seen for  eval and 5 tx and made good progress - in sessions makes great progress but still have trouble maintaining progress. Pain improved greatly in L breast with ROM - mostly now some tightness in L pect , axilla and lats.  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 This date add for pt pulleys to use at home for [facility] and PROM flexion and ABD - in combination of AAROM on wall following pulley's- to do in combination of  other HEP - cont red Thera-Band for scapular retraction and shoulder extension.Monitor lymphedema signs and symptoms.  Pt to continue to  benefit from skilled OT intervention to maximize safety and independence in necessary daily tasks.    PERFORMANCE DEFICITS: in functional skills including ADLs, IADLs, coordination, dexterity, sensation, edema, ROM, strength, pain, fascial restrictions, flexibility, decreased knowledge of use of DME, and UE functional use,  and psychosocial skills including environmental adaptation, habits, and routines and behaviors.   IMPAIRMENTS: are limiting patient from ADLs, IADLs, rest and sleep, and social participation.   COMORBIDITIES: may have co-morbidities  that affects occupational performance. Patient will benefit from skilled OT to address above impairments and improve overall function.  MODIFICATION OR ASSISTANCE TO COMPLETE EVALUATION: Min-Moderate modification of tasks or assist with assess necessary to complete an evaluation.  OT OCCUPATIONAL PROFILE AND HISTORY: Detailed assessment: Review of records and additional review of physical, cognitive, psychosocial history related to current functional performance.  CLINICAL DECISION MAKING: Moderate - several treatment options, min-mod task modification necessary  REHAB POTENTIAL: Good  EVALUATION COMPLEXITY: Moderate   PLAN:  OT FREQUENCY: 1x/week  OT DURATION: 6 wks  PLANNED INTERVENTIONS: 97168 OT Re-evaluation, 97535 self care/ADL training, 40981  therapeutic exercise, 97530 therapeutic activity, 97112 neuromuscular re-education, 97140 manual therapy, manual lymph drainage, passive range of motion, compression bandaging, coping strategies training, patient/family education, and DME and/or AE instructions  RECOMMENDED OTHER SERVICES: none currently  CONSULTED AND AGREED WITH PLAN OF CARE: Patient  Gloris Manchester, OTR/L, CLT 07/06/2023, 12:24 PM

## 2023-07-09 ENCOUNTER — Ambulatory Visit
Admission: RE | Admit: 2023-07-09 | Discharge: 2023-07-09 | Disposition: A | Discharge status: Home / Self Care | Payer: 59 | Source: Ambulatory Visit | Attending: Family Medicine | Admitting: Family Medicine

## 2023-07-09 DIAGNOSIS — G8929 Other chronic pain: Secondary | ICD-10-CM | POA: Diagnosis present

## 2023-07-09 DIAGNOSIS — M79604 Pain in right leg: Secondary | ICD-10-CM | POA: Diagnosis present

## 2023-07-09 DIAGNOSIS — M79605 Pain in left leg: Secondary | ICD-10-CM | POA: Diagnosis present

## 2023-07-10 ENCOUNTER — Ambulatory Visit
Admission: RE | Admit: 2023-07-10 | Discharge: 2023-07-10 | Disposition: A | Discharge status: Home / Self Care | Payer: 59 | Source: Ambulatory Visit | Attending: Family Medicine | Admitting: Family Medicine

## 2023-07-10 ENCOUNTER — Encounter: Payer: Self-pay | Admitting: Family Medicine

## 2023-07-10 ENCOUNTER — Ambulatory Visit: Payer: 59 | Admitting: Occupational Therapy

## 2023-07-10 DIAGNOSIS — G8929 Other chronic pain: Secondary | ICD-10-CM | POA: Diagnosis present

## 2023-07-10 DIAGNOSIS — M79604 Pain in right leg: Secondary | ICD-10-CM | POA: Diagnosis present

## 2023-07-10 DIAGNOSIS — M25622 Stiffness of left elbow, not elsewhere classified: Secondary | ICD-10-CM

## 2023-07-10 DIAGNOSIS — M6281 Muscle weakness (generalized): Secondary | ICD-10-CM

## 2023-07-10 DIAGNOSIS — M79605 Pain in left leg: Secondary | ICD-10-CM | POA: Insufficient documentation

## 2023-07-10 DIAGNOSIS — I890 Lymphedema, not elsewhere classified: Secondary | ICD-10-CM

## 2023-07-10 DIAGNOSIS — M79602 Pain in left arm: Secondary | ICD-10-CM

## 2023-07-10 NOTE — Therapy (Signed)
 OUTPATIENT OCCUPATIONAL THERAPY ORTHO TREATMENT/Recert  Patient Name: Leslie Bautista MRN: [id number] DOB:[birth year], 63 y.o., female  PCP: [facility] REFERRING PROVIDER: Hewitt Blade  END OF SESSION:  OT End of Session - 07/10/23 1359     Visit Number 7    Number of Visits 12    Date for OT Re-Evaluation 08/17/23    OT Start Time 1316    OT Stop Time 1401    OT Time Calculation (min) 45 min    Activity Tolerance Patient tolerated treatment well    Behavior During Therapy WFL for tasks assessed/performed             Past Medical History:  Diagnosis Date   Allergy    Cancer (HCC)    Cataract    Depression    Hypertension    Morbid obesity with BMI of 50.0-59.9, adult (HCC)    Obstructive sleep apnea    Osteoarthritis of both knees    Pre-diabetes    Sleep apnea    Past Surgical History:  Procedure Laterality Date   ANKLE SURGERY Right    ligament repair   BREAST SURGERY     CATARACT EXTRACTION, BILATERAL Bilateral 2022   COLONOSCOPY  2015   EYE SURGERY     JOINT REPLACEMENT Right    Knee   TONSILLECTOMY Bilateral 06/14/2021   Procedure: TONSILLECTOMY;  Surgeon: Bud Face, MD;  Location: [facility] ORS;  Service: ENT;  Laterality: Bilateral;   TOTAL KNEE ARTHROPLASTY Right    TUBAL LIGATION     Patient Active Problem List   Diagnosis Date Noted   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   Tenesmus (rectal) 06/29/2023   Chronic pain of both lower extremities 06/29/2023   Acute cough 06/29/2023   Need for hepatitis C screening test 06/29/2023   Screening for deficiency anemia 06/29/2023   Encounter to establish care with new doctor 06/29/2023   Neuropathy 06/29/2023   Chemotherapy-induced neutropenia (HCC) 12/05/2022   Thrombocytopenia (HCC) 12/05/2022   Lower urinary tract infectious disease 12/03/2022   Myocardial injury 12/03/2022   Obesity, Class III, BMI 40-49.9 (morbid obesity) (HCC) 12/03/2022   Breast cancer (HCC)  12/03/2022   HTN (hypertension) 12/03/2022   HLD (hyperlipidemia) 12/03/2022   OSA on CPAP 12/03/2022   Endometrial thickening on ultrasound 07/05/2021   Dyspareunia due to medical condition in female 10/24/2019   Postmenopause bleeding 03/08/2018   Pre-diabetes 11/22/2015   Depression 06/03/2014   Class 3 severe obesity without serious comorbidity with body mass index (BMI) of 50.0 to 59.9 in adult (HCC) 01/07/2014   Hypertension, benign 10/25/2011   Primary localized osteoarthrosis, lower leg 12/19/2007    ONSET DATE: December 2024  REFERRING DIAG: lymphedema Invasive ductal carcinoma of left breast  THERAPY DIAG:  Lymphedema, not elsewhere classified  Muscle weakness (generalized)  Stiffness of joint, upper arm, left  Pain in left arm  Rationale for Evaluation and Treatment: Rehabilitation  SUBJECTIVE:   SUBJECTIVE STATEMENT: I was not able to do any exercises today yet.  I picked up my 1 daughter from work, dropped my strengths and affect then picked up my other daughter and at a doctor's appointment and down here.  I did do the pulley the other day as well as the stress on the table   Pt accompanied by: self  PERTINENT HISTORY: Pt with complex medical history over the last year with recent cancer diagnosis.  She underwent surgery on 08/24/22 for a left lumpectomy with SLNB and b/l oncoplastic rearrangement of  tissue with reduction. She underwent chemotherapy with last dose given on 12/19/22 after 3 cycles and overall poor tolerance.  Radiation to left breast with boost to tumor bed 02/19/2023 and completed on 03/16/2023.    PRECAUTIONS: None   WEIGHT BEARING RESTRICTIONS: No  PAIN:  Are you having pain?  No pain more stiffness and tightness FALLS: Has patient fallen in last 6 months? No  LIVING ENVIRONMENT: Lives with: lives with their family Lives in: House/apartment Has following equipment at home: None  PLOF: Independent  PATIENT GOALS: Pt would like to  decrease the pain and stiffness of her left arm, decrease the pulling towards her back   NEXT MD VISIT: TBD  OBJECTIVE:  Note: Objective measures were completed at Evaluation unless otherwise noted.  HAND DOMINANCE: Right  ADLs: Overall ADLs: Pt able to complete basic self care tasks however has pain in left UE, difficulty with reaching tasks. She reports she feels her left arm is thick and tight, pulling towards her back along scar line.  She currently wears a compression bra and has decreased sensation in the area of her surgery.  She is currently not working.  She lives with her daughters and grandson.  She is able to engage in light homemaking tasks, cooking and is still able to drive.  Her daughters assist with heavier household chores.    FUNCTIONAL OUTCOME MEASURES: FOTO: TBD  UPPER EXTREMITY ROM:     Active ROM Right eval Left eval L 06/08/23 L 06/18/23 L 07/06/23  Shoulder flexion 150 128 160 170 140 coming in  Shoulder abduction 130 121 140 150 pull lat trunk 135 coming in   Shoulder adduction       Shoulder extension       Shoulder internal rotation       Shoulder external rotation       Elbow flexion WNL WNL     Elbow extension       Wrist flexion WNL WNL     Wrist extension       Wrist ulnar deviation       Wrist radial deviation       Wrist pronation WNL WNL     Wrist supination WNL WNL     (Blank rows = not tested)   UPPER EXTREMITY MMT:     MMT Right eval Left eval  Shoulder flexion    Shoulder abduction    Shoulder adduction    Shoulder extension    Shoulder internal rotation    Shoulder external rotation    Middle trapezius    Lower trapezius    Elbow flexion    Elbow extension    Wrist flexion    Wrist extension    Wrist ulnar deviation    Wrist radial deviation    Wrist pronation    Wrist supination    (Blank rows = not tested)  HAND FUNCTION: Grip strength: Right: 55 lbs; Left: 40 lbs, Lateral pinch: Right: 11 lbs, Left: 9 lbs, and 3  point pinch: Right: 7 lbs, Left: 5 lbs 06/26/23 Grip strength: Right: 55 lbs; Left: 43 lbs, Lateral pinch: Right: 14 lbs, Left: 14 lbs, and 3 point pinch: Right: 10 lbs, Left: 11 lbs  COORDINATION: Intact however 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     EDEMA: See flowsheet for circumferential measurements of bilateral UEs.  COGNITION: Overall cognitive status:  Within functional limits for tasks assessed  TREATMENT DATE: 07/09/2022    Pt reports tenderness and tightness and discomfort on the breast is much better.   Continues to feel tightness and pull pull at left axilla into lats and subscap with shoulder abduction overhead more than flexion. Manual Therapy:   Patient can start weaning  chip bag  about 4 days a week for only 4 hrs -  left lateral breast fibrosis improved greatly .     Done manual massage with Graston with pt in R sidelying -in axilla as well as posterior axilla and lateral border of scapula.  With great success.  As well as lateral deltoid.   Did some scapular mobilization  with shoulder flexion in side-lying.  G patient with increased active range of motion for shoulder abduction in supine.   Great improvement with abduction pain-free    Therapeutic Exercises:  This date review and done pulley's for shoulder flexion and ABD - 20 reps each needed verbal cues to keep shoulder down not activating upper traps on the left REINFORCE AGAIN FOR Pt to do HEP 3 X day and not to do AROM sitting    Patient to continue to pulleys at home 3 x a day Followed by AAROM rolling ball on wall for flexion bilateral  And ABD L UE on wall rolling ball 12 -15 reps Painfree  Pilates ring done with shoulder abduction as well as horizontal abduction for shoulder flexion in standing against wall 12 reps On omega Biodex done scapular  retraction in standing and sitting rowing  15 pounds 2 sets of 12 pain-free  Bilateral shoulder extension done in standing 2 sets of 12 with 3 pound weight  Ending on the NuStep with resistance 1 for 5 minutes pain-free   Discussed with patient looking into Silver sneakers at the [facility].   Continue at home red Thera-Band for patient for scapular retraction as well as shoulder extension 2 sets of 12 1 time a day. Encourage patient to do stretches    PATIENT EDUCATION: Education details: ROM exercises for bilateral UEs, with focus on left.  Scar massage, gentle massage around breast and towards back where she has the most tightness and stiffness. Person educated: Patient Education method: Medical illustrator Education comprehension: verbalized understanding and returned demonstration  HOME EXERCISE PROGRAM: Pt to perform gentle range of motion and stretching exercises at home with assistance from her daughters to massage along scar line and areas of tightness which are limiting her current motion in her arm.  She reports feeling of "thickness" however after treatment this date she reported area felt much better and pain decreased.    GOALS: Goals reviewed with patient? Yes  SHORT TERM GOALS: Target date: 06/01/2023  Pt to demonstrate understanding of home exercise program with modified independence. Baseline: no current program at eval. Goal status: INITIAL   LONG TERM GOALS: Target date: 06/29/2023  Pt will demonstrate decrease in pain, 2/10 or less in her left UE during self care tasks. Baseline: currently increased pain and tightness in left UE which makes her self care tasks difficult Goal status: INITIAL  2.  Pt to demonstrate improved left shoulder flexion by 10 degrees to facilitate reaching to obtain items from closet and shelves. Baseline: difficulty with reach at eval Goal status: INITIAL  3.  Pt to demonstrate understanding of home program to manage symptoms of  lymphedema including use of compression garments and/or pump if needed. Baseline: no current knowledge. Goal status: INITIAL  ASSESSMENT:  CLINICAL IMPRESSION: Patient is a 63 y.o. female who was seen for occupational therapy for lymphedema of left UE with pain in left arm. Pt is s/p lumpectomy in April of 2024 followed by 3 rounds of chemotherapy and 20 radiation treatments.  She also underwent a breast reduction on the right at the same time of surgery.  Pt presents with left arm and back pain which limits her active range of motion and resulting in decreased ability to perform reaching tasks, ADL and IADL tasks.  Pt was seen for eval and 5 tx and made good progress - in sessions makes great progress but still have trouble maintaining progress. Pain improved greatly in L breast with ROM - mostly now some tightness in left axilla  and lats/subscapularis.  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  Done in side-lying scapular mobilization to the left as well as soft tissue prior to reviewing with her using pulleys to use at home for [facility] and PROM flexion and ABD - in combination of AAROM on wall following pulley's for shoulder flexion abduction active assisted range of motion.  Followed by active assisted range of motion the wall and transition patient to doing Biodex weight machine using 15 pounds for scapular retraction and rowing pain-free.  Also done NuStep.  Discussed with patient transitioning and looking into Silver sneakers at the [facility]. Monitor lymphedema signs and symptoms.  Pt to continue to  benefit from skilled OT intervention to maximize safety and independence in necessary daily tasks.    PERFORMANCE DEFICITS: in functional skills including ADLs, IADLs, coordination, dexterity, sensation, edema, ROM, strength, pain, fascial restrictions, flexibility, decreased knowledge of use of DME, and UE functional use,  and psychosocial skills including  environmental adaptation, habits, and routines and behaviors.   IMPAIRMENTS: are limiting patient from ADLs, IADLs, rest and sleep, and social participation.   COMORBIDITIES: may have co-morbidities  that affects occupational performance. Patient will benefit from skilled OT to address above impairments and improve overall function.  MODIFICATION OR ASSISTANCE TO COMPLETE EVALUATION: Min-Moderate modification of tasks or assist with assess necessary to complete an evaluation.  OT OCCUPATIONAL PROFILE AND HISTORY: Detailed assessment: Review of records and additional review of physical, cognitive, psychosocial history related to current functional performance.  CLINICAL DECISION MAKING: Moderate - several treatment options, min-mod task modification necessary  REHAB POTENTIAL: Good  EVALUATION COMPLEXITY: Moderate   PLAN:  OT FREQUENCY: 1x/week  OT DURATION: 6 wks  PLANNED INTERVENTIONS: 97168 OT Re-evaluation, 97535 self care/ADL training, 40981 therapeutic exercise, 97530 therapeutic activity, 97112 neuromuscular re-education, 97140 manual therapy, manual lymph drainage, passive range of motion, compression bandaging, coping strategies training, patient/family education, and DME and/or AE instructions  RECOMMENDED OTHER SERVICES: none currently  CONSULTED AND AGREED WITH PLAN OF CARE: Patient  Gloris Manchester, OTR/L, CLT 07/10/2023, 2:58 PM

## 2023-07-12 ENCOUNTER — Ambulatory Visit: Payer: 59 | Admitting: Occupational Therapy

## 2023-07-13 ENCOUNTER — Encounter: Payer: Self-pay | Admitting: Family Medicine

## 2023-07-13 ENCOUNTER — Other Ambulatory Visit: Payer: Self-pay

## 2023-07-17 ENCOUNTER — Encounter: Payer: Self-pay | Admitting: Gastroenterology

## 2023-07-17 ENCOUNTER — Other Ambulatory Visit: Payer: Self-pay

## 2023-07-17 ENCOUNTER — Ambulatory Visit (INDEPENDENT_AMBULATORY_CARE_PROVIDER_SITE_OTHER): Payer: 59 | Admitting: Gastroenterology

## 2023-07-17 ENCOUNTER — Ambulatory Visit: Payer: 59 | Admitting: Occupational Therapy

## 2023-07-17 VITALS — BP 157/83 | HR 81 | Temp 97.7°F | Ht 63.0 in | Wt 286.0 lb

## 2023-07-17 DIAGNOSIS — R159 Full incontinence of feces: Secondary | ICD-10-CM

## 2023-07-17 DIAGNOSIS — Z860100 Personal history of colon polyps, unspecified: Secondary | ICD-10-CM

## 2023-07-17 MED ORDER — NA SULFATE-K SULFATE-MG SULF 17.5-3.13-1.6 GM/177ML PO SOLN: 354.0000 mL | Freq: Once | ORAL | 0 refills | Status: AC

## 2023-07-17 NOTE — Progress Notes (Addendum)
 Arlyss Repress, MD [address]  [address]  [place], [place] [address]  Main: [phone]  Fax: [phone]    Gastroenterology Consultation  Referring Provider:     Sallee Provencal, FNP Primary Care Physician:  Sallee Provencal, FNP Primary Gastroenterologist:  Dr. Arlyss Repress Reason for Consultation: Evaluate for rectovaginal fistula        HPI:   Leslie Bautista is a 63 y.o. female referred by Sallee Provencal, FNP  for consultation & management of leakage of stool.  Patient has history of metabolic syndrome and she reports intermittent symptoms of passing stool from the front/vaginal in addition to rectum.  She denies any symptoms of urinary tract infection.  She denies any rectal bleeding.  The symptoms are intermittent and have been ongoing for a long time and especially notices when her stools are loose.  She denies any constipation.  She denies any other GI symptoms  NSAIDs: None  Antiplts/Anticoagulants/Anti thrombotics: None  GI Procedures: States that she had a colonoscopy over 5 years ago, polyps were detected  Past Medical History:  Diagnosis Date   Allergy    Cancer (HCC)    Cataract    Depression    Hypertension    Morbid obesity with BMI of 50.0-59.9, adult (HCC)    Obstructive sleep apnea    Osteoarthritis of both knees    Pre-diabetes    Sleep apnea     Past Surgical History:  Procedure Laterality Date   ANKLE SURGERY Right    ligament repair   BREAST SURGERY     CATARACT EXTRACTION, BILATERAL Bilateral 2022   COLONOSCOPY  2015   EYE SURGERY     JOINT REPLACEMENT Right    Knee   TONSILLECTOMY Bilateral 06/14/2021   Procedure: TONSILLECTOMY;  Surgeon: Bud Face, MD;  Location: [facility] ORS;  Service: ENT;  Laterality: Bilateral;   TOTAL KNEE ARTHROPLASTY Right    TUBAL LIGATION       Current Outpatient Medications:    amLODipine (NORVASC) 10 MG tablet, Take 1 tablet by mouth daily., Disp: , Rfl:    anastrozole  (ARIMIDEX) 1 MG tablet, Take 1 mg by mouth daily., Disp: , Rfl:    atorvastatin (LIPITOR) 20 MG tablet, Take 20 mg by mouth at bedtime., Disp: , Rfl:    benzonatate (TESSALON) 100 MG capsule, Take 1 capsule (100 mg total) by mouth 3 (three) times daily as needed for cough., Disp: 20 capsule, Rfl: 0   gabapentin (NEURONTIN) 300 MG capsule, Take 300 mg by mouth. Take 1 capsule in the evening and 2 capsules nightly. May take additional 1 capsule nightly as needed, Disp: , Rfl:    gabapentin (NEURONTIN) 600 MG tablet, Take 600 mg by mouth 2 (two) times daily., Disp: , Rfl:    losartan (COZAAR) 50 MG tablet, Take 50 mg by mouth daily., Disp: , Rfl:    meloxicam (MOBIC) 7.5 MG tablet, Take 7.5 mg by mouth 2 (two) times daily., Disp: , Rfl:    Na Sulfate-K Sulfate-Mg Sulfate concentrate (SUPREP) 17.5-3.13-1.6 GM/177ML SOLN, Take 1 kit (354 mLs total) by mouth once for 1 dose., Disp: 354 mL, Rfl: 0   nortriptyline (PAMELOR) 10 MG capsule, Take 20 mg by mouth at bedtime., Disp: , Rfl:    omeprazole (PRILOSEC) 20 MG capsule, Take 20 mg by mouth daily., Disp: , Rfl:    pregabalin (LYRICA) 50 MG capsule, Take 50 mg by mouth daily., Disp: , Rfl:    Semaglutide (  RYBELSUS) 3 MG TABS, Take 1 tablet (3 mg total) by mouth daily., Disp: 30 tablet, Rfl: 0   traZODone (DESYREL) 50 MG tablet, Take 50 mg by mouth at bedtime., Disp: , Rfl:    [START ON 08/03/2023] Semaglutide 7 MG TABS, Take 1 tablet (7 mg total) by mouth daily. (Patient not taking: Reported on 07/17/2023), Disp: 30 tablet, Rfl: 3   Family History  Problem Relation Age of Onset   Pancreatic cancer Mother    Prostate cancer Father    Hypertension Sister    Hypertension Brother      Social History   Tobacco Use   Smoking status: Never   Smokeless tobacco: Never  Vaping Use   Vaping status: Never Used  Substance Use Topics   Alcohol use: Not Currently    Comment: rarely   Drug use: No    Allergies as of 07/17/2023 - Review Complete  07/17/2023  Allergen Reaction Noted   Metformin and related Diarrhea 07/03/2023   Oxycontin [oxycodone hcl] Rash 09/29/2014    Review of Systems:    All systems reviewed and negative except where noted in HPI.   Physical Exam:  BP (!) 157/83 (BP Location: Right Arm, Patient Position: Sitting, Cuff Size: Large)   Pulse 81   Temp 97.7 F (36.5 C) (Oral)   Ht 5\' 3"  (1.6 m)   Wt 286 lb (129.7 kg)   LMP 10/15/2019 Comment: Patient states there is no way she is pregnant.   BMI 50.66 kg/m  Patient's last menstrual period was 10/15/2019.  General:   Alert,  Well-developed, well-nourished, pleasant and cooperative in NAD Head:  Normocephalic and atraumatic. Eyes:  Sclera clear, no icterus.   Conjunctiva pink. Ears:  Normal auditory acuity. Nose:  No deformity, discharge, or lesions. Mouth:  No deformity or lesions,oropharynx pink & moist. Neck:  Supple; no masses or thyromegaly. Lungs:  Respirations even and unlabored.  Clear throughout to auscultation.   No wheezes, crackles, or rhonchi. No acute distress. Heart:  Regular rate and rhythm; no murmurs, clicks, rubs, or gallops. Abdomen:  Normal bowel sounds. Soft, non-tender and non-distended without masses, hepatosplenomegaly or hernias noted.  No guarding or rebound tenderness.   Rectal: Not performed Msk:  Symmetrical without gross deformities. Good, equal movement & strength bilaterally. Pulses:  Normal pulses noted. Extremities:  No clubbing or edema.  No cyanosis. Neurologic:  Alert and oriented x3;  grossly normal neurologically. Skin:  Intact without significant lesions or rashes. No jaundice. Psych:  Alert and cooperative. Normal mood and affect.  Imaging Studies: Reviewed  Assessment and Plan:   Leslie Bautista is a 63 y.o. female with history of metabolic syndrome, breast cancer s/p left lumpectomy, s/p chemotherapy which resulted in diarrhea, this has resolved since stopping chemotherapy.  Patient states she is noticing  passage of stools possibly from vaginal especially in setting of loose stools  Recommend colonoscopy with 2-day prep for further evaluation, as well as history of colon polyps She has an appointment with OB/GYN on 3/18   Follow up based on the colonoscopy results   Arlyss Repress, MD

## 2023-07-18 ENCOUNTER — Telehealth: Payer: Self-pay | Admitting: Family Medicine

## 2023-07-18 NOTE — Telephone Encounter (Signed)
 Pt given [place] results per notes of Charlcie Cradle, FNP from 07/13/23 on 07/18/23. Pt verbalized understanding.      Copied from CRM [id number]. Topic: Clinical - Lab/Test Results >> Jul 18, 2023  2:03 PM Macon Large wrote: Reason for CRM: Patient returned call for ultrasound results. Transferred to nurse triage

## 2023-07-19 ENCOUNTER — Encounter: Payer: Self-pay | Admitting: Emergency Medicine

## 2023-07-19 ENCOUNTER — Other Ambulatory Visit: Payer: Self-pay

## 2023-07-19 ENCOUNTER — Ambulatory Visit: Payer: Self-pay | Admitting: Family Medicine

## 2023-07-19 ENCOUNTER — Emergency Department
Admission: EM | Admit: 2023-07-19 | Discharge: 2023-07-19 | Disposition: A | Discharge status: Home / Self Care | Payer: 59 | Attending: Emergency Medicine | Admitting: Emergency Medicine

## 2023-07-19 DIAGNOSIS — I10 Essential (primary) hypertension: Secondary | ICD-10-CM | POA: Diagnosis not present

## 2023-07-19 DIAGNOSIS — E119 Type 2 diabetes mellitus without complications: Secondary | ICD-10-CM | POA: Insufficient documentation

## 2023-07-19 DIAGNOSIS — R1030 Lower abdominal pain, unspecified: Secondary | ICD-10-CM | POA: Diagnosis present

## 2023-07-19 DIAGNOSIS — K529 Noninfective gastroenteritis and colitis, unspecified: Secondary | ICD-10-CM | POA: Diagnosis not present

## 2023-07-19 HISTORY — DX: Type 2 diabetes mellitus without complications: E11.9

## 2023-07-19 LAB — COMPREHENSIVE METABOLIC PANEL
ALT: 18 U/L (ref 0–44)
AST: 23 U/L (ref 15–41)
Albumin: 3.7 g/dL (ref 3.5–5.0)
Alkaline Phosphatase: 100 U/L (ref 38–126)
Anion gap: 11 (ref 5–15)
BUN: 17 mg/dL (ref 8–23)
CO2: 24 mmol/L (ref 22–32)
Calcium: 9.6 mg/dL (ref 8.9–10.3)
Chloride: 103 mmol/L (ref 98–111)
Creatinine, Ser: 1.08 mg/dL — ABNORMAL HIGH (ref 0.44–1.00)
GFR, Estimated: 58 mL/min — ABNORMAL LOW (ref >60)
Glucose, Bld: 115 mg/dL — ABNORMAL HIGH (ref 70–99)
Potassium: 4 mmol/L (ref 3.5–5.1)
Sodium: 138 mmol/L (ref 135–145)
Total Bilirubin: 0.8 mg/dL (ref 0.0–1.2)
Total Protein: 8 g/dL (ref 6.5–8.1)

## 2023-07-19 LAB — RESP PANEL BY RT-PCR (RSV, FLU A&B, COVID)  RVPGX2
Influenza A by PCR: NEGATIVE
Influenza B by PCR: NEGATIVE
Resp Syncytial Virus by PCR: NEGATIVE
SARS Coronavirus 2 by RT PCR: NEGATIVE

## 2023-07-19 LAB — CBC
HCT: 37.3 % (ref 36.0–46.0)
Hemoglobin: 12.4 g/dL (ref 12.0–15.0)
MCH: 28.8 pg (ref 26.0–34.0)
MCHC: 33.2 g/dL (ref 30.0–36.0)
MCV: 86.7 fL (ref 80.0–100.0)
Platelets: 144 10*3/uL — ABNORMAL LOW (ref 150–400)
RBC: 4.3 MIL/uL (ref 3.87–5.11)
RDW: 14.4 % (ref 11.5–15.5)
WBC: 5.3 10*3/uL (ref 4.0–10.5)
nRBC: 0 % (ref 0.0–0.2)

## 2023-07-19 LAB — LIPASE, BLOOD: Lipase: 22 U/L (ref 11–51)

## 2023-07-19 MED ORDER — ACETAMINOPHEN 500 MG PO TABS
1000.0000 mg | ORAL_TABLET | Freq: Once | ORAL | Status: AC
  Administered 2023-07-19: 1000 mg via ORAL
  Filled 2023-07-19: qty 2

## 2023-07-19 MED ORDER — KETOROLAC TROMETHAMINE 30 MG/ML IJ SOLN
30.0000 mg | Freq: Once | INTRAMUSCULAR | Status: AC
  Administered 2023-07-19: 30 mg via INTRAMUSCULAR
  Filled 2023-07-19: qty 1

## 2023-07-19 MED ORDER — ONDANSETRON 4 MG PO TBDP
4.0000 mg | ORAL_TABLET | Freq: Once | ORAL | Status: AC
  Administered 2023-07-19: 4 mg via ORAL
  Filled 2023-07-19: qty 1

## 2023-07-19 MED ORDER — ONDANSETRON 4 MG PO TBDP: 4.0000 mg | ORAL_TABLET | Freq: Three times a day (TID) | ORAL | 0 refills | Status: AC | PRN

## 2023-07-19 NOTE — ED Triage Notes (Signed)
 Patient to ED via POV for abd pain, vomiting, diarrhea and headache. Ongoing since this AM. Had sick contacts.

## 2023-07-19 NOTE — ED Provider Notes (Addendum)
 [facility] Provider Note    Event Date/Time   First MD Initiated Contact with Patient 07/19/23 1349     (approximate)   History   Abdominal Pain   HPI  Leslie Bautista is a 63 y.o. female  with HTN, HLD, diabetes who comes in with nausea, vomiting, lower abdominal pain, diarrhea.  Patient is not on any immunotherapy.  She is on a hormone medication.  She reports multiple episodes of diarrhea.  No blood.  He denies any recent antibiotics, recent traveling I states, recent drinking [place] water.  She also reports multiple episodes of vomiting.  She reports some lower abdominal cramping associated with it.  She states that the 79 year old grand child has also been sick with something similar.  They told them it was a viral illness.  She reports that this started this morning.   Physical Exam   Triage Vital Signs: ED Triage Vitals  Encounter Vitals Group     BP 07/19/23 1245 (!) 170/102     Systolic BP Percentile --      Diastolic BP Percentile --      Pulse Rate 07/19/23 1245 85     Resp 07/19/23 1245 18     Temp 07/19/23 1245 98.5 F (36.9 C)     Temp Source 07/19/23 1245 Oral     SpO2 07/19/23 1245 100 %     Weight 07/19/23 1246 286 lb (129.7 kg)     Height 07/19/23 1246 5\' 2"  (1.575 m)     Head Circumference --      Peak Flow --      Pain Score 07/19/23 1245 10     Pain Loc --      Pain Education --      Exclude from Growth Chart --     Most recent vital signs: Vitals:   07/19/23 1245  BP: (!) 170/102  Pulse: 85  Resp: 18  Temp: 98.5 F (36.9 C)  SpO2: 100%     General: Awake, no distress.  CV:  Good peripheral perfusion.  Resp:  Normal effort.  Abd:  No distention.  Soft and nontender without any rebound, guarding. Other:     ED Results / Procedures / Treatments   Labs (all labs ordered are listed, but only abnormal results are displayed) Labs Reviewed  COMPREHENSIVE METABOLIC PANEL - Abnormal; Notable for the following  components:      Result Value   Glucose, Bld 115 (*)    Creatinine, Ser 1.08 (*)    GFR, Estimated 58 (*)    All other components within normal limits  CBC - Abnormal; Notable for the following components:   Platelets 144 (*)    All other components within normal limits  RESP PANEL BY RT-PCR (RSV, FLU A&B, COVID)  RVPGX2  LIPASE, BLOOD  URINALYSIS, ROUTINE W REFLEX MICROSCOPIC     PROCEDURES:  Critical Care performed: No  Procedures   MEDICATIONS ORDERED IN ED: Medications  ondansetron (ZOFRAN-ODT) disintegrating tablet 4 mg (4 mg Oral Given 07/19/23 1444)  acetaminophen (TYLENOL) tablet 1,000 mg (1,000 mg Oral Given 07/19/23 1443)  ketorolac (TORADOL) 30 MG/ML injection 30 mg (30 mg Intramuscular Given 07/19/23 1445)     IMPRESSION / MDM / ASSESSMENT AND PLAN / ED COURSE  I reviewed the triage vital signs and the nursing notes.   Patient's presentation is most consistent with acute presentation with potential threat to life or bodily function.   Patient comes in with nausea vomiting  diarrhea abdominal pain.  Abdomen is soft and nontender although she reports some cramping sensation.  I suspect this is most likely viral given onset this morning.  We discussed CT imaging to evaluate for other acute pathology such as diverticulitis, appendicitis but patient is opted to decline and would prefer monitoring it today and reevaluating tomorrow.  She understands she can return tomorrow if she develops worsening symptoms.  Patient's COVID and flu test were negative.  Lipase normal CMP shows stable creatinine.  CBC shows slightly low platelets.  Patient was offered IV versus oral, IM medications.  Patient preferred oral.  Patient reports improvement after oral medications, tolerating p.o. and feels comfortable with discharge home.  She is opted to hold off on CT imaging and will return tomorrow if symptoms are worsening.  Pt declined during UA test- she denies any symptoms Recommend follow  up for her BP but most likely related to illness.   FINAL CLINICAL IMPRESSION(S) / ED DIAGNOSES   Final diagnoses:  Gastroenteritis     Rx / DC Orders   ED Discharge Orders          Ordered    ondansetron (ZOFRAN-ODT) 4 MG disintegrating tablet  Every 8 hours PRN        07/19/23 1538             Note:  This document was prepared using Dragon voice recognition software and may include unintentional dictation errors.   Concha Se, MD 07/19/23 1538    Concha Se, MD 07/19/23 [phone]

## 2023-07-19 NOTE — Discharge Instructions (Addendum)
 Stay well-hydrated with Pedialyte, Gatorade without sugar return to the ER if you develop worsening symptoms or any other concerns.  We discussed CT imaging but of opted to hold off today since I suspect this is more likely viral in nature but if things are worsening develop worsening pain or lower abdomen he can always return to the ER for repeat evaluation

## 2023-07-19 NOTE — Telephone Encounter (Signed)
 Chief Complaint: Diarrhea Symptoms: Headache, stomach pain (10/10) Frequency: Constant stomach pain Pertinent Negatives: Patient denies nausea, vomiting, dizziness, blood in stool Disposition: [x] ED /[] Urgent Care (no appt availability in office) / [] Appointment(In office/virtual)/ []  [facility]/ [] Home Care/ [] Refused Recommended Disposition /[] [facility]/ []  Follow-up with PCP Additional Notes: Pt states the diarrhea and stomach pain started this morning. Pt states she has had x3 bowel movements. Pt states her grandson has the stomach bug and she has been around him. Pt states she is doing a lot of belching but she can't have a bm. Pt states she has had some water and ginger ale. Pt also took [employer] but it did not help. Pt states the stomach pain is 10/10. Pt advised to go to ED. This RN offered to call EMS for pt but pt declined. Pt told to have another adult drive pt to ED. Pt states understanding and that she will get someone else to drive her to ED. Pt verbalized understanding and agrees to plan.    Copied from CRM [id number]. Topic: Clinical - Red Word Triage >> Jul 19, 2023 10:44 AM Macon Large wrote: Red Word that prompted transfer to Nurse Triage: sever stomach pain with diarrhea Reason for Disposition  [1] SEVERE abdominal pain (e.g., excruciating) AND [2] present > 1 hour  Answer Assessment - Initial Assessment Questions 1. DIARRHEA SEVERITY: "How bad is the diarrhea?" "How many more stools have you had in the past 24 hours than normal?"    - NO DIARRHEA (SCALE 0)   - MILD (SCALE 1-3): Few loose or mushy BMs; increase of 1-3 stools over normal daily number of stools; mild increase in ostomy output.   -  MODERATE (SCALE 4-7): Increase of 4-6 stools daily over normal; moderate increase in ostomy output.   -  SEVERE (SCALE 8-10; OR "WORST POSSIBLE"): Increase of 7 or more stools daily over normal; moderate increase in ostomy output; incontinence.     Mild 2.  ONSET: "When did the diarrhea begin?"      This morning 3. BM CONSISTENCY: "How loose or watery is the diarrhea?"      Watery 4. VOMITING: "Are you also vomiting?" If Yes, ask: "How many times in the past 24 hours?"      Denies 5. ABDOMEN PAIN: "Are you having any abdomen pain?" If Yes, ask: "What does it feel like?" (e.g., crampy, dull, intermittent, constant)      Constant 6. ABDOMEN PAIN SEVERITY: If present, ask: "How bad is the pain?"  (e.g., Scale 1-10; mild, moderate, or severe)   - MILD (1-3): doesn't interfere with normal activities, abdomen soft and not tender to touch    - MODERATE (4-7): interferes with normal activities or awakens from sleep, abdomen tender to touch    - SEVERE (8-10): excruciating pain, doubled over, unable to do any normal activities       10 7. ORAL INTAKE: If vomiting, "Have you been able to drink liquids?" "How much liquids have you had in the past 24 hours?"     Water and ginger ale 8. HYDRATION: "Any signs of dehydration?" (e.g., dry mouth [not just dry lips], too weak to stand, dizziness, new weight loss) "When did you last urinate?"     Denies 9. ANTIBIOTIC USE: "Are you taking antibiotics now or have you taken antibiotics in the past 2 months?"       Denies  Protocols used: Diarrhea-A-AH

## 2023-07-20 ENCOUNTER — Ambulatory Visit: Payer: 59 | Admitting: Occupational Therapy

## 2023-07-26 ENCOUNTER — Other Ambulatory Visit: Payer: Self-pay

## 2023-07-26 ENCOUNTER — Encounter
Admission: RE | Disposition: A | Discharge status: Home / Self Care | Payer: Self-pay | Source: Home / Self Care | Attending: Gastroenterology

## 2023-07-26 ENCOUNTER — Encounter: Payer: Self-pay | Admitting: Gastroenterology

## 2023-07-26 ENCOUNTER — Ambulatory Visit: Admitting: Certified Registered"

## 2023-07-26 ENCOUNTER — Ambulatory Visit
Admission: RE | Admit: 2023-07-26 | Discharge: 2023-07-26 | Disposition: A | Discharge status: Home / Self Care | Payer: 59 | Attending: Gastroenterology | Admitting: Gastroenterology

## 2023-07-26 DIAGNOSIS — I10 Essential (primary) hypertension: Secondary | ICD-10-CM | POA: Diagnosis not present

## 2023-07-26 DIAGNOSIS — G4733 Obstructive sleep apnea (adult) (pediatric): Secondary | ICD-10-CM | POA: Insufficient documentation

## 2023-07-26 DIAGNOSIS — D122 Benign neoplasm of ascending colon: Secondary | ICD-10-CM | POA: Insufficient documentation

## 2023-07-26 DIAGNOSIS — K219 Gastro-esophageal reflux disease without esophagitis: Secondary | ICD-10-CM | POA: Insufficient documentation

## 2023-07-26 DIAGNOSIS — F32A Depression, unspecified: Secondary | ICD-10-CM | POA: Insufficient documentation

## 2023-07-26 DIAGNOSIS — Z791 Long term (current) use of non-steroidal anti-inflammatories (NSAID): Secondary | ICD-10-CM | POA: Diagnosis not present

## 2023-07-26 DIAGNOSIS — Z6841 Body Mass Index (BMI) 40.0 and over, adult: Secondary | ICD-10-CM | POA: Insufficient documentation

## 2023-07-26 DIAGNOSIS — Z1211 Encounter for screening for malignant neoplasm of colon: Secondary | ICD-10-CM | POA: Insufficient documentation

## 2023-07-26 DIAGNOSIS — M170 Bilateral primary osteoarthritis of knee: Secondary | ICD-10-CM | POA: Insufficient documentation

## 2023-07-26 DIAGNOSIS — K635 Polyp of colon: Secondary | ICD-10-CM

## 2023-07-26 DIAGNOSIS — Z79811 Long term (current) use of aromatase inhibitors: Secondary | ICD-10-CM | POA: Insufficient documentation

## 2023-07-26 DIAGNOSIS — M1990 Unspecified osteoarthritis, unspecified site: Secondary | ICD-10-CM | POA: Insufficient documentation

## 2023-07-26 DIAGNOSIS — E119 Type 2 diabetes mellitus without complications: Secondary | ICD-10-CM | POA: Diagnosis not present

## 2023-07-26 DIAGNOSIS — Z860100 Personal history of colon polyps, unspecified: Secondary | ICD-10-CM

## 2023-07-26 HISTORY — PX: COLONOSCOPY WITH PROPOFOL: SHX5780

## 2023-07-26 HISTORY — PX: POLYPECTOMY: SHX5525

## 2023-07-26 LAB — GLUCOSE, CAPILLARY: Glucose-Capillary: 98 mg/dL (ref 70–99)

## 2023-07-26 SURGERY — COLONOSCOPY WITH PROPOFOL
Anesthesia: General

## 2023-07-26 MED ORDER — LIDOCAINE HCL (PF) 2 % IJ SOLN
INTRAMUSCULAR | Status: AC
  Filled 2023-07-26: qty 20

## 2023-07-26 MED ORDER — PROPOFOL 10 MG/ML IV BOLUS
INTRAVENOUS | Status: AC
  Filled 2023-07-26: qty 20

## 2023-07-26 MED ORDER — LIDOCAINE HCL (CARDIAC) PF 100 MG/5ML IV SOSY
PREFILLED_SYRINGE | INTRAVENOUS | Status: DC | PRN
  Administered 2023-07-26: 50 mg via INTRAVENOUS

## 2023-07-26 MED ORDER — PROPOFOL 1000 MG/100ML IV EMUL
INTRAVENOUS | Status: AC
  Filled 2023-07-26: qty 500

## 2023-07-26 MED ORDER — PROPOFOL 500 MG/50ML IV EMUL
INTRAVENOUS | Status: DC | PRN
  Administered 2023-07-26: 150 ug/kg/min via INTRAVENOUS

## 2023-07-26 MED ORDER — SODIUM CHLORIDE 0.9 % IV SOLN: INTRAVENOUS | Status: DC

## 2023-07-26 MED ORDER — PROPOFOL 1000 MG/100ML IV EMUL
INTRAVENOUS | Status: AC
  Filled 2023-07-26: qty 100

## 2023-07-26 MED ORDER — LIDOCAINE HCL URETHRAL/MUCOSAL 2 % EX GEL
CUTANEOUS | Status: AC
  Filled 2023-07-26: qty 10

## 2023-07-26 NOTE — Op Note (Signed)
 [facility] Gastroenterology Patient Name: Leslie Bautista Procedure Date: 07/26/2023 7:18 AM MRN: [id number] Account #: [id number] Date of Birth: 12/23/1960 Admit Type: Outpatient Age: 63 Room: [facility] ENDO ROOM 3 Gender: Female Note Status: Finalized Instrument Name: Prentice Docker [id number] Procedure:             Colonoscopy Indications:           High risk colon cancer surveillance: Personal history                         of colonic polyps Providers:             Toney Reil MD, MD Referring MD:          No Local Md, MD (Referring MD) Medicines:             General Anesthesia Complications:         No immediate complications. Estimated blood loss: None. Procedure:             Pre-Anesthesia Assessment:                        - Prior to the procedure, a History and Physical was                         performed, and patient medications and allergies were                         reviewed. The patient is competent. The risks and                         benefits of the procedure and the sedation options and                         risks were discussed with the patient. All questions                         were answered and informed consent was obtained.                         Patient identification and proposed procedure were                         verified by the physician, the nurse, the                         anesthesiologist, the anesthetist and the technician                         in the pre-procedure area in the procedure room in the                         endoscopy suite. Mental Status Examination: alert and                         oriented. Airway Examination: normal oropharyngeal                         airway and neck mobility. Respiratory Examination:  clear to auscultation. CV Examination: normal.                         Prophylactic Antibiotics: The patient does not require                         prophylactic antibiotics.  Prior Anticoagulants: The                         patient has taken no anticoagulant or antiplatelet                         agents. ASA Grade Assessment: III - A patient with                         severe systemic disease. After reviewing the risks and                         benefits, the patient was deemed in satisfactory                         condition to undergo the procedure. The anesthesia                         plan was to use general anesthesia. Immediately prior                         to administration of medications, the patient was                         re-assessed for adequacy to receive sedatives. The                         heart rate, respiratory rate, oxygen saturations,                         blood pressure, adequacy of pulmonary ventilation, and                         response to care were monitored throughout the                         procedure. The physical status of the patient was                         re-assessed after the procedure.                        After obtaining informed consent, the colonoscope was                         passed under direct vision. Throughout the procedure,                         the patient's blood pressure, pulse, and oxygen                         saturations were monitored continuously. The  Colonoscope was introduced through the anus and                         advanced to the the cecum, identified by appendiceal                         orifice and ileocecal valve. The colonoscopy was                         performed without difficulty. The patient tolerated                         the procedure well. The quality of the bowel                         preparation was evaluated using the BBPS [facility] Bowel                         Preparation Scale) with scores of: Right Colon = 3,                         Transverse Colon = 3 and Left Colon = 3 (entire mucosa                         seen well with no  residual staining, small fragments                         of stool or opaque liquid). The total BBPS score                         equals 9. The ileocecal valve, appendiceal orifice,                         and rectum were photographed. Findings:      The perianal and digital rectal examinations were normal. Pertinent       negatives include normal sphincter tone and no palpable rectal lesions.      A 5 mm polyp was found in the ascending colon. The polyp was sessile.       The polyp was removed with a cold snare. Resection and retrieval were       complete. Estimated blood loss: none.      The retroflexed view of the distal rectum and anal verge was normal and       showed no anal or rectal abnormalities.      The exam was otherwise without abnormality. Impression:            - One 5 mm polyp in the ascending colon, removed with                         a cold snare. Resected and retrieved.                        - The distal rectum and anal verge are normal on                         retroflexion view.                        -  The examination was otherwise normal. Recommendation:        - Discharge patient to home (with escort).                        - Resume previous diet today.                        - Continue present medications.                        - Await pathology results.                        - Repeat colonoscopy in 5 to 7 years for surveillance                         based on pathology results. Procedure Code(s):     --- Professional ---                        [phone], Colonoscopy, flexible; with removal of                         tumor(s), polyp(s), or other lesion(s) by snare                         technique Diagnosis Code(s):     --- Professional ---                        Z86.010, Personal history of colonic polyps                        D12.2, Benign neoplasm of ascending colon CPT copyright 2022 American Medical Association. All rights reserved. The codes  documented in this report are preliminary and upon coder review may  be revised to meet current compliance requirements. Dr. Libby Maw Toney Reil MD, MD 07/26/2023 8:21:19 AM This report has been signed electronically. Number of Addenda: 0 Note Initiated On: 07/26/2023 7:18 AM Scope Withdrawal Time: 0 hours 8 minutes 0 seconds  Total Procedure Duration: 0 hours 11 minutes 22 seconds  Estimated Blood Loss:  Estimated blood loss: none.      [facility]

## 2023-07-26 NOTE — Anesthesia Preprocedure Evaluation (Signed)
 Anesthesia Evaluation  Patient identified by MRN, date of birth, ID band Patient awake    Reviewed: Allergy & Precautions, NPO status , Patient's Chart, lab work & pertinent test results  History of Anesthesia Complications Negative for: history of anesthetic complications  Airway Mallampati: III   Neck ROM: Full    Dental   Missing molars:   Pulmonary neg shortness of breath, sleep apnea , neg COPD, neg recent URI   Pulmonary exam normal breath sounds clear to auscultation       Cardiovascular Exercise Tolerance: Good hypertension, (-) angina (-) Past MI and (-) Cardiac Stents Normal cardiovascular exam(-) dysrhythmias (-) Valvular Problems/Murmurs Rhythm:Regular Rate:Normal  ECG 06/08/21:  Sinus bradycardia (HR 51) with sinus arrhythmia Otherwise normal ECG When compared with ECG of 05-Aug-2014 17:12, No significant change was found  Echo 02/09/18:   Technically difficult study due to chest wall/lung interference   Normal left ventricular systolic function, ejection fraction 60 to 65%   Mitral annular calcification   Normal right ventricular systolic function   Neuro/Psych  PSYCHIATRIC DISORDERS  Depression    negative neurological ROS     GI/Hepatic Neg liver ROS,GERD  Medicated,,  Endo/Other  diabetes  Class 3 obesityClass 3 obesity, prediabetes  Renal/GU negative Renal ROS     Musculoskeletal  (+) Arthritis ,    Abdominal   Peds  Hematology negative hematology ROS (+)   Anesthesia Other Findings Past Medical History: No date: Allergy No date: Cancer [facility])     Comment:  breast cancer on left side No date: Cataract No date: Depression No date: Diabetes mellitus without complication (HCC) No date: Hypertension No date: Morbid obesity with BMI of 50.0-59.9, adult (HCC) No date: Obstructive sleep apnea No date: Osteoarthritis of both knees No date: Pre-diabetes No date: Sleep apnea    Reproductive/Obstetrics                             Anesthesia Physical Anesthesia Plan  ASA: 3  Anesthesia Plan: General   Post-op Pain Management:    Induction: Intravenous  PONV Risk Score and Plan: 3 and Treatment may vary due to age or medical condition, Propofol infusion and TIVA  Airway Management Planned: Natural Airway and Nasal Cannula  Additional Equipment:   Intra-op Plan:   Post-operative Plan:   Informed Consent: I have reviewed the patients History and Physical, chart, labs and discussed the procedure including the risks, benefits and alternatives for the proposed anesthesia with the patient or authorized representative who has indicated his/her understanding and acceptance.     Dental advisory given  Plan Discussed with: CRNA  Anesthesia Plan Comments: (Patient consented for risks of anesthesia including but not limited to:  - adverse reactions to medications - damage to eyes, teeth, lips or other oral mucosa - nerve damage due to positioning  - sore throat or hoarseness - damage to heart, brain, nerves, lungs, other parts of body or loss of life  Informed patient about role of CRNA in peri- and intra-operative care.  Patient voiced understanding.)        Anesthesia Quick Evaluation

## 2023-07-26 NOTE — Anesthesia Postprocedure Evaluation (Signed)
 Anesthesia Post Note  Patient: Leslie Bautista  Procedure(s) Performed: COLONOSCOPY WITH PROPOFOL POLYPECTOMY  Patient location during evaluation: Endoscopy Anesthesia Type: General Level of consciousness: awake and alert Pain management: pain level controlled Vital Signs Assessment: post-procedure vital signs reviewed and stable Respiratory status: spontaneous breathing, nonlabored ventilation, respiratory function stable and patient connected to nasal cannula oxygen Cardiovascular status: blood pressure returned to baseline and stable Postop Assessment: no apparent nausea or vomiting Anesthetic complications: no   There were no known notable events for this encounter.   Last Vitals:  Vitals:   07/26/23 0834 07/26/23 0847  BP: 131/63 (!) 143/79  Pulse: 74   Resp: 19 17  Temp:    SpO2: 100% 96%    Last Pain:  Vitals:   07/26/23 0847  TempSrc:   PainSc: 0-No pain                 Lenard Simmer

## 2023-07-26 NOTE — H&P (Signed)
 Arlyss Repress, MD [address]  [address]  [place], [place] [address]  Main: [phone]  Fax: [phone] Pager: [phone]  Primary Care Physician:  Sallee Provencal, FNP Primary Gastroenterologist:  Dr. Arlyss Repress  Pre-Procedure History & Physical: HPI:  Leslie Bautista is a 63 y.o. female is here for an colonoscopy.   Past Medical History:  Diagnosis Date   Allergy    Cancer (HCC)    breast cancer on left side   Cataract    Depression    Diabetes mellitus without complication (HCC)    Hypertension    Morbid obesity with BMI of 50.0-59.9, adult (HCC)    Obstructive sleep apnea    Osteoarthritis of both knees    Pre-diabetes    Sleep apnea     Past Surgical History:  Procedure Laterality Date   ANKLE SURGERY Right    ligament repair   BREAST SURGERY     CATARACT EXTRACTION, BILATERAL Bilateral 2022   COLONOSCOPY  2015   EYE SURGERY     JOINT REPLACEMENT Right    Knee   TONSILLECTOMY Bilateral 06/14/2021   Procedure: TONSILLECTOMY;  Surgeon: Bud Face, MD;  Location: [facility] ORS;  Service: ENT;  Laterality: Bilateral;   TOTAL KNEE ARTHROPLASTY Right    TUBAL LIGATION      Prior to Admission medications   Medication Sig Start Date End Date Taking? Authorizing Provider  anastrozole (ARIMIDEX) 1 MG tablet Take 1 mg by mouth daily.   Yes [provider]  atorvastatin (LIPITOR) 20 MG tablet Take 20 mg by mouth at bedtime. 05/06/21  Yes [provider]  losartan (COZAAR) 50 MG tablet Take 50 mg by mouth daily.   Yes [provider]  meloxicam (MOBIC) 7.5 MG tablet Take 7.5 mg by mouth 2 (two) times daily. 06/27/23  Yes [provider]  nortriptyline (PAMELOR) 10 MG capsule Take 20 mg by mouth at bedtime.   Yes [provider]  omeprazole (PRILOSEC) 20 MG capsule Take 20 mg by mouth daily. 03/08/23  Yes [provider]  pregabalin (LYRICA) 50 MG capsule Take 50 mg by mouth daily. 04/23/23  Yes  [provider]  Semaglutide (RYBELSUS) 3 MG TABS Take 1 tablet (3 mg total) by mouth daily. 07/06/23  Yes Charlcie Cradle A, FNP  traZODone (DESYREL) 50 MG tablet Take 50 mg by mouth at bedtime. 04/30/14  Yes [provider]  amLODipine (NORVASC) 10 MG tablet Take 1 tablet by mouth daily. 07/24/16   [provider]  benzonatate (TESSALON) 100 MG capsule Take 1 capsule (100 mg total) by mouth 3 (three) times daily as needed for cough. 06/29/23   Sallee Provencal, FNP  gabapentin (NEURONTIN) 300 MG capsule Take 300 mg by mouth. Take 1 capsule in the evening and 2 capsules nightly. May take additional 1 capsule nightly as needed 06/19/22   [provider]  gabapentin (NEURONTIN) 600 MG tablet Take 600 mg by mouth 2 (two) times daily. 03/22/23   [provider]  Semaglutide 7 MG TABS Take 1 tablet (7 mg total) by mouth daily. Patient not taking: Reported on 07/17/2023 08/03/23   Sallee Provencal, FNP    Allergies as of 07/17/2023 - Review Complete 07/17/2023  Allergen Reaction Noted   Metformin and related Diarrhea 07/03/2023   Oxycontin [oxycodone hcl] Rash 09/29/2014    Family History  Problem Relation Age of Onset   Pancreatic cancer Mother    Prostate cancer Father  Hypertension Sister    Hypertension Brother     Social History   Socioeconomic History   Marital status: Single    Spouse name: Not on file   Number of children: Not on file   Years of education: Not on file   Highest education level: Not on file  Occupational History   Not on file  Tobacco Use   Smoking status: Never   Smokeless tobacco: Never  Vaping Use   Vaping status: Never Used  Substance and Sexual Activity   Alcohol use: Not Currently   Drug use: No   Sexual activity: Not Currently    Birth control/protection: Post-menopausal  Other Topics Concern   Not on file  Social History Narrative   Lives with daughters and grand child   Social Drivers of Health    Financial Resource Strain: Low Risk  (05/18/2023)   Received from [facility]   Overall Financial Resource Strain (CARDIA)    Difficulty of Paying Living Expenses: Not very hard  Recent Concern: Financial Resource Strain - High Risk (04/30/2023)   Received from [facility]   Overall Financial Resource Strain (CARDIA)    Difficulty of Paying Living Expenses: Very hard  Food Insecurity: No Food Insecurity (05/18/2023)   Received from [facility]   Hunger Vital Sign    Worried About Running Out of Food in the Last Year: Never true    Ran Out of Food in the Last Year: Never true  Recent Concern: Food Insecurity - Food Insecurity Present (04/30/2023)   Received from [facility]   Hunger Vital Sign    Worried About Running Out of Food in the Last Year: Sometimes true    Ran Out of Food in the Last Year: Sometimes true  Transportation Needs: No Transportation Needs (05/18/2023)   Received from [facility] - Transportation    In the past 12 months, has lack of transportation kept you from medical appointments or from getting medications?: No    Lack of Transportation (Non-Medical): No  Physical Activity: Not on file  Stress: No Stress Concern Present (03/29/2020)   Received from [facility]   [facility] of Occupational Health - Occupational Stress Questionnaire    Feeling of Stress : Only a little  Social Connections: Not on file  Intimate Partner Violence: Not At Risk (12/03/2022)   Humiliation, Afraid, Rape, and Kick questionnaire    Fear of Current or Ex-Partner: No    Emotionally Abused: No    Physically Abused: No    Sexually Abused: No    Review of Systems: See HPI, otherwise negative ROS  Physical Exam: BP 133/62   Pulse 73   Temp (!) 96 F (35.6 C) (Temporal)   Resp 16   Ht 5\' 3"  (1.6 m)   Wt 126 kg   LMP 10/15/2019 Comment: Patient states there is no way she is  pregnant.   SpO2 100%   BMI 49.21 kg/m  General:   Alert,  pleasant and cooperative in NAD Head:  Normocephalic and atraumatic. Neck:  Supple; no masses or thyromegaly. Lungs:  Clear throughout to auscultation.    Heart:  Regular rate and rhythm. Abdomen:  Soft, nontender and nondistended. Normal bowel sounds, without guarding, and without rebound.   Neurologic:  Alert and  oriented x4;  grossly normal neurologically.  Impression/Plan: Leslie Bautista is here for a colonoscopy to be performed for h/o colon polyps  Risks, benefits, limitations, and alternatives regarding  colonoscopy have been reviewed with the patient.  Questions have been answered.  All parties agreeable.   Lannette Donath, MD  07/26/2023, 8:02 AM

## 2023-07-26 NOTE — Transfer of Care (Signed)
 Immediate Anesthesia Transfer of Care Note  Patient: Leslie Bautista  Procedure(s) Performed: COLONOSCOPY WITH PROPOFOL POLYPECTOMY  Patient Location: PACU  Anesthesia Type:General  Level of Consciousness: awake, alert , and patient cooperative  Airway & Oxygen Therapy: Patient Spontanous Breathing  Post-op Assessment: Report given to RN and Post -op Vital signs reviewed and stable  Post vital signs: stable  Last Vitals:  Vitals Value Taken Time  BP 110/52 07/26/23 0823  Temp    Pulse 77 07/26/23 0824  Resp 19 07/26/23 0824  SpO2 98 % 07/26/23 0824  Vitals shown include unfiled device data.  Last Pain:  Vitals:   07/26/23 0725  TempSrc: Temporal  PainSc: 0-No pain         Complications: No notable events documented.

## 2023-07-27 ENCOUNTER — Encounter: Payer: Self-pay | Admitting: Gastroenterology

## 2023-07-27 LAB — SURGICAL PATHOLOGY

## 2023-07-30 ENCOUNTER — Encounter: Payer: Self-pay | Admitting: Gastroenterology

## 2023-07-30 ENCOUNTER — Encounter: Payer: Self-pay | Admitting: Emergency Medicine

## 2023-08-01 ENCOUNTER — Ambulatory Visit: Admitting: Emergency Medicine

## 2023-08-01 VITALS — Ht 63.0 in | Wt 277.0 lb

## 2023-08-01 DIAGNOSIS — Z0000 Encounter for general adult medical examination without abnormal findings: Secondary | ICD-10-CM | POA: Diagnosis not present

## 2023-08-01 NOTE — Progress Notes (Signed)
 Subjective:   Leslie Bautista is a 63 y.o. who presents for a Medicare Wellness preventive visit.  Visit Complete: Virtual I connected with  [name] on 08/01/23 by a audio enabled telemedicine application and verified that I am speaking with the correct person using two identifiers.  Patient Location: Home  Provider Location: Home Office  I discussed the limitations of evaluation and management by telemedicine. The patient expressed understanding and agreed to proceed.  Vital Signs: Because this visit was a virtual/telehealth visit, some criteria may be missing or patient reported. Any vitals not documented were not able to be obtained and vitals that have been documented are patient reported.  VideoDeclined- This patient declined Librarian, academic. Therefore the visit was completed with audio only.  AWV Questionnaire: No: Patient Medicare AWV questionnaire was not completed prior to this visit.  Cardiac Risk Factors include: hypertension;dyslipidemia;obesity (BMI >30kg/m2);Other (see comment), Risk factor comments: OSA (cpap), prediabetic     Objective:    Today's Vitals   08/01/23 0812  Weight: 277 lb (125.6 kg)  Height: 5\' 3"  (1.6 m)   Body mass index is 49.07 kg/m.     08/01/2023    8:23 AM 07/26/2023    7:21 AM 07/19/2023   12:47 PM 12/03/2022    1:45 PM 11/29/2021    6:22 PM 08/06/2021   11:27 AM 06/07/2021    2:08 PM  Advanced Directives  Does Patient Have a Medical Advance Directive? No No No No No No No  Would patient like information on creating a medical advance directive? Yes (MAU/Ambulatory/Procedural Areas - Information given) No - Patient declined  No - Patient declined No - Patient declined  No - Patient declined    Current Medications (verified) Outpatient Encounter Medications as of 08/01/2023  Medication Sig   amLODipine (NORVASC) 10 MG tablet Take 1 tablet by mouth daily.   anastrozole (ARIMIDEX) 1 MG tablet Take 1  mg by mouth daily.   atorvastatin (LIPITOR) 20 MG tablet Take 20 mg by mouth at bedtime.   gabapentin (NEURONTIN) 300 MG capsule Take 300 mg by mouth. Take 1 capsule in the evening and 2 capsules nightly. May take additional 1 capsule nightly as needed   gabapentin (NEURONTIN) 600 MG tablet Take 600 mg by mouth 2 (two) times daily.   losartan (COZAAR) 50 MG tablet Take 50 mg by mouth daily.   meloxicam (MOBIC) 7.5 MG tablet Take 7.5 mg by mouth 2 (two) times daily.   nortriptyline (PAMELOR) 10 MG capsule Take 20 mg by mouth at bedtime.   Semaglutide (RYBELSUS) 3 MG TABS Take 1 tablet (3 mg total) by mouth daily.   traZODone (DESYREL) 50 MG tablet Take 50 mg by mouth at bedtime.   benzonatate (TESSALON) 100 MG capsule Take 1 capsule (100 mg total) by mouth 3 (three) times daily as needed for cough. (Patient not taking: Reported on 08/01/2023)   omeprazole (PRILOSEC) 20 MG capsule Take 20 mg by mouth daily. (Patient not taking: Reported on 08/01/2023)   pregabalin (LYRICA) 50 MG capsule Take 50 mg by mouth daily. (Patient not taking: Reported on 08/01/2023)   [START ON 08/03/2023] Semaglutide 7 MG TABS Take 1 tablet (7 mg total) by mouth daily. (Patient not taking: Reported on 08/01/2023)   No facility-administered encounter medications on file as of 08/01/2023.    Allergies (verified) Metformin and related and Oxycontin [oxycodone hcl]   History: Past Medical History:  Diagnosis Date   Allergy    Cancer (HCC)  breast cancer on left side   Cataract    Depression    Diabetes mellitus without complication (HCC)    Hypertension    Morbid obesity with BMI of 50.0-59.9, adult (HCC)    Obstructive sleep apnea    Osteoarthritis of both knees    Pre-diabetes    Sleep apnea    Past Surgical History:  Procedure Laterality Date   ANKLE SURGERY Right    ligament repair   BREAST SURGERY     CATARACT EXTRACTION, BILATERAL Bilateral 2022   COLONOSCOPY  2015   COLONOSCOPY WITH PROPOFOL N/A  07/26/2023   Procedure: COLONOSCOPY WITH PROPOFOL;  Surgeon: Toney Reil, MD;  Location: [facility] END[facility];  Service: Gastroenterology;  Laterality: N/A;   EYE SURGERY     JOINT REPLACEMENT Right    Knee   POLYPECTOMY  07/26/2023   Procedure: POLYPECTOMY;  Surgeon: Toney Reil, MD;  Location: [facility] END[facility]COPY;  Service: Gastroenterology;;   TONSILLECTOMY Bilateral 06/14/2021   Procedure: TONSILLECTOMY;  Surgeon: Bud Face, MD;  Location: [facility] ORS;  Service: ENT;  Laterality: Bilateral;   TOTAL KNEE ARTHROPLASTY Right    TUBAL LIGATION     Family History  Problem Relation Age of Onset   Pancreatic cancer Mother    Prostate cancer Father    Hypertension Sister    Hypertension Brother    Social History   Socioeconomic History   Marital status: Single    Spouse name: Not on file   Number of children: 2   Years of education: Not on file   Highest education level: Not on file  Occupational History   Not on file  Tobacco Use   Smoking status: Never   Smokeless tobacco: Never  Vaping Use   Vaping status: Never Used  Substance and Sexual Activity   Alcohol use: Not Currently   Drug use: No   Sexual activity: Not Currently    Birth control/protection: Post-menopausal  Other Topics Concern   Not on file  Social History Narrative   Lives with daughters and grand child   Social Drivers of Health   Financial Resource Strain: Low Risk  (08/01/2023)   Overall Financial Resource Strain (CARDIA)    Difficulty of Paying Living Expenses: Not very hard  Food Insecurity: No Food Insecurity (08/01/2023)   Hunger Vital Sign    Worried About Running Out of Food in the Last Year: Never true    Ran Out of Food in the Last Year: Never true  Transportation Needs: No Transportation Needs (08/01/2023)   PRAPARE - Administrator, Civil Service (Medical): No    Lack of Transportation (Non-Medical): No  Physical Activity: Inactive (08/01/2023)   Exercise Vital Sign     Days of Exercise per Week: 0 days    Minutes of Exercise per Session: 0 min  Stress: No Stress Concern Present (08/01/2023)   [employer] of Occupational Health - Occupational Stress Questionnaire    Feeling of Stress : Not at all  Social Connections: Moderately Isolated (08/01/2023)   Social Connection and Isolation Panel [NHANES]    Frequency of Communication with Friends and Family: More than three times a week    Frequency of Social Gatherings with Friends and Family: More than three times a week    Attends Religious Services: More than 4 times per year    Active Member of [employer] or Organizations: No    Attends Banker Meetings: Never    Marital Status: Never married  Tobacco Counseling Counseling given: Not Answered    Clinical Intake:  Pre-visit preparation completed: Yes  Pain : No/denies pain     BMI - recorded: 49.07 Nutritional Status: BMI > 30  Obese Nutritional Risks: None Diabetes: No  How often do you need to have someone help you when you read instructions, pamphlets, or other written materials from your doctor or pharmacy?: 1 - Never  Interpreter Needed?: No  Information entered by :: Tora Kindred, CMA   Activities of Daily Living     08/01/2023    8:13 AM 12/03/2022    1:45 PM  In your present state of health, do you have any difficulty performing the following activities:  Hearing? 0 0  Vision? 0 0  Difficulty concentrating or making decisions? 0 0  Walking or climbing stairs? 1 0  Comment due to neuropathy   Dressing or bathing? 0 0  Doing errands, shopping? 0 0  Preparing Food and eating ? N   Using the Toilet? N   In the past six months, have you accidently leaked urine? N   Do you have problems with loss of bowel control? N   Managing your Medications? N   Managing your Finances? N   Housekeeping or managing your Housekeeping? N     Patient Care Team: Sallee Provencal, FNP as PCP - General (Family Medicine) Irene Limbo., MD (Ophthalmology) Toney Reil, MD as Consulting Physician (Gastroenterology) Bridgette Habermann, PA-C (Neurology) Hoyt Koch, NP as Nurse Practitioner (Oncology) Alan Ripper, PhD as Referring Physician (Radiation Oncology)  Indicate any recent Medical Services you may have received from other than [facility] providers in the past year (date may be approximate).     Assessment:   This is a routine wellness examination for Austine.  Hearing/Vision screen Hearing Screening - Comments:: Denies hearing loss Vision Screening - Comments:: Gets eye exams, Dr. Hulen Luster, [place]   Goals Addressed               This Visit's Progress     Patient Stated (pt-stated)        Lose 50 lbs       Depression Screen     08/01/2023    8:20 AM 06/29/2023    9:42 AM  PHQ 2/9 Scores  PHQ - 2 Score 0 0  PHQ- 9 Score 3 8    Fall Risk     08/01/2023    8:24 AM 06/29/2023    9:43 AM  Fall Risk   Falls in the past year? 0 0  Number falls in past yr: 0 0  Injury with Fall? 0 0  Risk for fall due to : No Fall Risks   Follow up Falls prevention discussed;Falls evaluation completed     MEDICARE RISK AT HOME:  Medicare Risk at Home Any stairs in or around the home?: Yes If so, are there any without handrails?: No Home free of loose throw rugs in walkways, pet beds, electrical cords, etc?: Yes Adequate lighting in your home to reduce risk of falls?: Yes Life alert?: No Use of a cane, walker or w/c?: No Grab bars in the bathroom?: No Shower chair or bench in shower?: No Elevated toilet seat or a handicapped toilet?: No  TIMED UP AND GO:  Was the test performed?  No  Cognitive Function: 6CIT completed        08/01/2023    8:25 AM  6CIT Screen  What Year? 0 points  What month? 0 points  What time? 0 points  Count back from 20 0 points  Months in reverse 2 points  Repeat phrase 2 points  Total Score 4 points    Immunizations Immunization History   Administered Date(s) Administered   DTaP 01/16/1963, 10/12/1964, 12/20/1964, 04/11/1965, 02/15/1968   Influenza, Seasonal, Injecte, Preservative Fre 03/27/2023   Influenza,inj,Quad PF,6+ Mos 02/22/2015, 02/21/2016, 01/23/2018   Measles 08/21/1970   PFIZER(Purple Top)SARS-COV-2 Vaccination 08/28/2019, 09/23/2019, 07/02/2020   Pfizer(Comirnaty)Fall Seasonal Vaccine 12 years and older 03/27/2023   Polio, Unspecified 07/21/1962, 09/15/1962, 04/11/1965, 03/03/1975, 05/02/1978   Rubella 08/21/1970   Smallpox 02/15/1968   Td 05/02/1978, 01/08/2006   Tdap 01/08/2006, 02/04/2014    Screening Tests Health Maintenance  Topic Date Due   Pneumococcal Vaccine 97-5 Years old (1 of 2 - PCV) Never done   Zoster Vaccines- Shingrix (1 of 2) Never done   Cervical Cancer Screening (HPV/Pap Cotest)  02/27/2021   COVID-19 Vaccine (5 - Pfizer risk 2024-25 season) 09/24/2023   DTaP/Tdap/Td (9 - Td or Tdap) 02/05/2024   MAMMOGRAM  05/21/2024   Medicare Annual Wellness (AWV)  07/31/2024   Colonoscopy  07/26/2030   INFLUENZA VACCINE  Completed   Hepatitis C Screening  Completed   HIV Screening  Completed   HPV VACCINES  Aged Out    Health Maintenance  Health Maintenance Due  Topic Date Due   Pneumococcal Vaccine 61-71 Years old (1 of 2 - PCV) Never done   Zoster Vaccines- Shingrix (1 of 2) Never done   Cervical Cancer Screening (HPV/Pap Cotest)  02/27/2021   Health Maintenance Items Addressed: See Nurse Notes  Additional Screening:  Vision Screening: Recommended annual ophthalmology exams for early detection of glaucoma and other disorders of the eye.  Dental Screening: Recommended annual dental exams for proper oral hygiene  Community Resource Referral / Chronic Care Management: CRR required this visit?  No   CCM required this visit?  No     Plan:     I have personally reviewed and noted the following in the patient's chart:   Medical and social history Use of alcohol, tobacco or  illicit drugs  Current medications and supplements including opioid prescriptions. Patient is not currently taking opioid prescriptions. Functional ability and status Nutritional status Physical activity Advanced directives List of other physicians Hospitalizations, surgeries, and ER visits in previous 12 months Vitals Screenings to include cognitive, depression, and falls Referrals and appointments  In addition, I have reviewed and discussed with patient certain preventive protocols, quality metrics, and best practice recommendations. A written personalized care plan for preventive services as well as general preventive health recommendations were provided to patient.     Tora Kindred, CMA   08/01/2023   After Visit Summary: (MyChart) Due to this being a telephonic visit, the after visit summary with patients personalized plan was offered to patient via MyChart   Notes:  6 CIT Score - 4 Needs pneumonia and shingles vaccines Will be due for Tdap 01/2024

## 2023-08-01 NOTE — Patient Instructions (Addendum)
 Leslie Bautista , Thank you for taking time to come for your Medicare Wellness Visit. I appreciate your ongoing commitment to your health goals. Please review the following plan we discussed and let me know if I can assist you in the future.   Referrals/Orders/Follow-Ups/Clinician Recommendations: You will be due to a tetanus shot 01/2024. Discuss getting the pneumonia shot with your doctor. You may get the shingles vaccines at your local pharmacy.  This is a list of the screening recommended for you and due dates:  Health Maintenance  Topic Date Due   Pneumococcal Vaccination (1 of 2 - PCV) Never done   Zoster (Shingles) Vaccine (1 of 2) Never done   Pap with HPV screening  02/27/2021   COVID-19 Vaccine (5 - Pfizer risk 2024-25 season) 09/24/2023   DTaP/Tdap/Td vaccine (9 - Td or Tdap) 02/05/2024   Mammogram  05/21/2024   Medicare Annual Wellness Visit  07/31/2024   Colon Cancer Screening  07/26/2030   Flu Shot  Completed   Hepatitis C Screening  Completed   HIV Screening  Completed   HPV Vaccine  Aged Out    Advanced directives: (ACP Link)Information on Advanced Care Planning can be found at [facility] Advance Health Care Directives Advance Health Care Directives. [email or web address]  You may also get these forms at your doctor's office.  Once you have completed the forms, please bring a copy of your health care power of attorney and living will to the office to be added to your chart at your convenience.   Next Medicare Annual Wellness Visit scheduled for next year: Yes, 08/06/24 @ 10:10am (phone visit)

## 2023-08-02 DIAGNOSIS — G620 Drug-induced polyneuropathy: Secondary | ICD-10-CM | POA: Insufficient documentation

## 2023-08-06 ENCOUNTER — Ambulatory Visit: Attending: Oncology | Admitting: Occupational Therapy

## 2023-08-06 DIAGNOSIS — M25622 Stiffness of left elbow, not elsewhere classified: Secondary | ICD-10-CM | POA: Insufficient documentation

## 2023-08-06 DIAGNOSIS — M6281 Muscle weakness (generalized): Secondary | ICD-10-CM | POA: Diagnosis present

## 2023-08-06 DIAGNOSIS — M79602 Pain in left arm: Secondary | ICD-10-CM | POA: Insufficient documentation

## 2023-08-06 DIAGNOSIS — I890 Lymphedema, not elsewhere classified: Secondary | ICD-10-CM | POA: Insufficient documentation

## 2023-08-06 NOTE — Therapy (Signed)
 OUTPATIENT OCCUPATIONAL THERAPY ORTHO TREATMENT  Patient Name: Leslie Bautista MRN: [id number] DOB:Sep 17, 1960, 63 y.o., female  PCP: [facility] REFERRING PROVIDER: Hewitt Blade  END OF SESSION:  OT End of Session - 08/06/23 1129     Visit Number 8    Number of Visits 12    Date for OT Re-Evaluation 08/17/23    OT Start Time 0949    OT Stop Time 1033    OT Time Calculation (min) 44 min    Activity Tolerance Patient tolerated treatment well    Behavior During Therapy WFL for tasks assessed/performed             Past Medical History:  Diagnosis Date   Allergy    Cancer (HCC)    breast cancer on left side   Cataract    Depression    Diabetes mellitus without complication (HCC)    Hypertension    Morbid obesity with BMI of 50.0-59.9, adult (HCC)    Obstructive sleep apnea    Osteoarthritis of both knees    Pre-diabetes    Sleep apnea    Past Surgical History:  Procedure Laterality Date   ANKLE SURGERY Right    ligament repair   BREAST SURGERY     CATARACT EXTRACTION, BILATERAL Bilateral 2022   COLONOSCOPY  2015   COLONOSCOPY WITH PROPOFOL N/A 07/26/2023   Procedure: COLONOSCOPY WITH PROPOFOL;  Surgeon: Toney Reil, MD;  Location: [facility] ENDOSCOPY;  Service: Gastroenterology;  Laterality: N/A;   EYE SURGERY     JOINT REPLACEMENT Right    Knee   POLYPECTOMY  07/26/2023   Procedure: POLYPECTOMY;  Surgeon: Toney Reil, MD;  Location: [facility] ENDOSCOPY;  Service: Gastroenterology;;   TONSILLECTOMY Bilateral 06/14/2021   Procedure: TONSILLECTOMY;  Surgeon: Bud Face, MD;  Location: [facility] ORS;  Service: ENT;  Laterality: Bilateral;   TOTAL KNEE ARTHROPLASTY Right    TUBAL LIGATION     Patient Active Problem List   Diagnosis Date Noted   Hx of colonic polyps 07/26/2023   Polyp of ascending colon 07/26/2023   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   Tenesmus (rectal) 06/29/2023   Chronic pain of both lower extremities 06/29/2023    Acute cough 06/29/2023   Need for hepatitis C screening test 06/29/2023   Screening for deficiency anemia 06/29/2023   Encounter to establish care with new doctor 06/29/2023   Neuropathy 06/29/2023   Chemotherapy-induced neutropenia (HCC) 12/05/2022   Thrombocytopenia (HCC) 12/05/2022   Lower urinary tract infectious disease 12/03/2022   Myocardial injury 12/03/2022   Obesity, Class III, BMI 40-49.9 (morbid obesity) (HCC) 12/03/2022   Breast cancer (HCC) 12/03/2022   HTN (hypertension) 12/03/2022   HLD (hyperlipidemia) 12/03/2022   OSA on CPAP 12/03/2022   Hypersensitivity reaction 10/24/2022   Invasive ductal carcinoma of breast, left (HCC) 09/26/2022   Endometrial thickening on ultrasound 07/05/2021   CKD stage G3a/A1, 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) 12/10/2019   Dyspareunia due to medical condition in female 10/24/2019   Postmenopause bleeding 03/08/2018   Pre-diabetes 11/22/2015   Depression 06/03/2014   Class 3 severe obesity without serious comorbidity with body mass index (BMI) of 50.0 to 59.9 in adult (HCC) 01/07/2014   Hypertension, benign 10/25/2011   Primary localized osteoarthrosis, lower leg 12/19/2007    ONSET DATE: December 2024  REFERRING DIAG: lymphedema Invasive ductal carcinoma of left breast  THERAPY DIAG:  Lymphedema, not elsewhere classified  Muscle weakness (generalized)  Stiffness of joint, upper arm, left  Pain  in left arm  Rationale for Evaluation and Treatment: Rehabilitation  SUBJECTIVE:   SUBJECTIVE STATEMENT: I lost my pulley.  But I continue doing the wall slides to keep the shoulder loose.  I did see my radiation doctor.  My left breast is larger than the right.  I wore a partial prosthesis in my right.  I do not think I told you.  But my left breast is fuller and larger.   Pt accompanied by: self  PERTINENT HISTORY: Pt with complex medical history over the last year with recent cancer diagnosis.  She  underwent surgery on 08/24/22 for a left lumpectomy with SLNB and b/l oncoplastic rearrangement of tissue with reduction. She underwent chemotherapy with last dose given on 12/19/22 after 3 cycles and overall poor tolerance.  Radiation to left breast with boost to tumor bed 02/19/2023 and completed on 03/16/2023.    PRECAUTIONS: None   WEIGHT BEARING RESTRICTIONS: No  PAIN:  Are you having pain?  No pain stiffness and tightness  under my L armpitFALLS: Has patient fallen in last 6 months? No  LIVING ENVIRONMENT: Lives with: lives with their family Lives in: House/apartment Has following equipment at home: None  PLOF: Independent  PATIENT GOALS: Pt would like to decrease the pain and stiffness of her left arm, decrease the pulling towards her back   NEXT MD VISIT: TBD  OBJECTIVE:  Note: Objective measures were completed at Evaluation unless otherwise noted.  HAND DOMINANCE: Right  ADLs: Overall ADLs: Pt able to complete basic self care tasks however has pain in left UE, difficulty with reaching tasks. She reports she feels her left arm is thick and tight, pulling towards her back along scar line.  She currently wears a compression bra and has decreased sensation in the area of her surgery.  She is currently not working.  She lives with her daughters and grandson.  She is able to engage in light homemaking tasks, cooking and is still able to drive.  Her daughters assist with heavier household chores.    FUNCTIONAL OUTCOME MEASURES: FOTO: TBD  UPPER EXTREMITY ROM:     Active ROM Right eval Left eval L 06/08/23 L 06/18/23 L 07/06/23  Shoulder flexion 150 128 160 170 140 coming in  Shoulder abduction 130 121 140 150 pull lat trunk 135 coming in   Shoulder adduction       Shoulder extension       Shoulder internal rotation       Shoulder external rotation       Elbow flexion WNL WNL     Elbow extension       Wrist flexion WNL WNL     Wrist extension       Wrist ulnar deviation        Wrist radial deviation       Wrist pronation WNL WNL     Wrist supination WNL WNL     (Blank rows = not tested)   UPPER EXTREMITY MMT:     MMT Right eval Left eval  Shoulder flexion    Shoulder abduction    Shoulder adduction    Shoulder extension    Shoulder internal rotation    Shoulder external rotation    Middle trapezius    Lower trapezius    Elbow flexion    Elbow extension    Wrist flexion    Wrist extension    Wrist ulnar deviation    Wrist radial deviation    Wrist pronation  Wrist supination    (Blank rows = not tested)  HAND FUNCTION: Grip strength: Right: 55 lbs; Left: 40 lbs, Lateral pinch: Right: 11 lbs, Left: 9 lbs, and 3 point pinch: Right: 7 lbs, Left: 5 lbs 06/26/23 Grip strength: Right: 55 lbs; Left: 43 lbs, Lateral pinch: Right: 14 lbs, Left: 14 lbs, and 3 point pinch: Right: 10 lbs, Left: 11 lbs  COORDINATION: Intact however 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     EDEMA: See flowsheet for circumferential measurements of bilateral UEs.  COGNITION: Overall cognitive status: Within functional limits for tasks assessed  TREATMENT DATE: 08/06/2022    Pt reports tenderness and tightness and discomfort on the breast is much better.   But increased lymphedema in the left breast.  Since seen about 4 weeks ago.  Increased heaviness and fullness in the left breast. Measurements from medial to lateral breast over nipple right breast 31 cm left 37 cm Nipple height same Decrease fibrosis. Bilateral upper extremities appear to be within normal limits circumference.  Patient denies any symptoms of lymphedema in the upper extremity Patient is left-hand dominant. Recommend for patient to unilateral postmastectomy Jovi pack to wear as much as she can for 2 to 4 weeks on left breast. Under compression bra.  To  decrease lymphedema in left breast. Education information provided and patient to contact [facility] to order and fit      Therapeutic Exercises:  Patient lost her pulleys the last 4 weeks. But tried to do daily her active assisted range of motion on wall for shoulder flexion and abduction. Reviewed with patient again needed mod assist plus verbal cueing  Upgrade patient to green Thera-Band for scapular retraction and next shoulder extension. Add also latissimus dorsi pull downs 2 sets of 15 once a day Discussed with patient REAL and HEEL exercise program Patient to discuss with daughter schedule to see if work schedule can modify for her to be able to attend. Discussed with patient also looking into Silver sneakers at the [facility].     PATIENT EDUCATION: Education details: ROM exercises for bilateral UEs, with focus on left.  Scar massage, gentle massage around breast and towards back where she has the most tightness and stiffness. Person educated: Patient Education method: Medical illustrator Education comprehension: verbalized understanding and returned demonstration  HOME EXERCISE PROGRAM: Pt to perform gentle range of motion and stretching exercises at home with assistance from her daughters to massage along scar line and areas of tightness which are limiting her current motion in her arm.  She reports feeling of "thickness" however after treatment this date she reported area felt much better and pain decreased.    GOALS: Goals reviewed with patient? Yes  SHORT TERM GOALS: Target date: 06/01/2023  Pt to demonstrate understanding of home exercise program with modified independence. Baseline: no current program at eval. Goal status: INITIAL   LONG TERM GOALS: Target date: 06/29/2023  Pt will demonstrate decrease in pain, 2/10 or less in her left UE during self care tasks. Baseline: currently increased pain and tightness in left UE which makes her self care tasks  difficult Goal status: INITIAL  2.  Pt to demonstrate improved left shoulder flexion by 10 degrees to facilitate reaching to obtain items from closet and shelves. Baseline: difficulty with reach at eval Goal status: INITIAL  3.  Pt to demonstrate understanding of  home program to manage symptoms of lymphedema including use of compression garments and/or pump if needed. Baseline: no current knowledge. Goal status: INITIAL  ASSESSMENT:  CLINICAL IMPRESSION: Patient is a 63 y.o. female who was seen for occupational therapy for lymphedema of left UE with pain in left arm. Pt is s/p lumpectomy in April of 2024 followed by 3 rounds of chemotherapy and 20 radiation treatments.  She also underwent a breast reduction on the right at the same time of surgery.  Patient was not seen for about 4 weeks.  Patient was able to maintain range of motion in shoulder flexion and abduction.  Still continues to lack endrange.  As well as decreased strength.  Pain improved greatly in L breast with ROM - mostly now some tightness in left axilla  and lats/subscapularis.  Information was provided for patient to contact [facility] to be fitted with a unilateral postmastectomy Jovi pack and compression bra to decrease lymphedema in left breast.  Left upper extremity appeared to be within normal limits patient is left-hand dominant.  And denies any symptoms.  Will continue to monitor.  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  Upgrade patient to green Thera-Band for scapular retraction as well as latissimus dorsi pull downs.  Recommended for patient to Real and Heel exercise program but need to see if she can do it with her schedule assisting family members. Also discussed with patient transitioning and looking into Silver sneakers at the [facility]. Monitor lymphedema signs and symptoms and left upper extremity as well as educating and wearing of Jovi pack to decrease left breast  lymphedema and thoracic.Marland Kitchen  Pt to continue to  benefit from skilled OT intervention to maximize safety and independence in necessary daily tasks.    PERFORMANCE DEFICITS: in functional skills including ADLs, IADLs, coordination, dexterity, sensation, edema, ROM, strength, pain, fascial restrictions, flexibility, decreased knowledge of use of DME, and UE functional use,  and psychosocial skills including environmental adaptation, habits, and routines and behaviors.   IMPAIRMENTS: are limiting patient from ADLs, IADLs, rest and sleep, and social participation.   COMORBIDITIES: may have co-morbidities  that affects occupational performance. Patient will benefit from skilled OT to address above impairments and improve overall function.  MODIFICATION OR ASSISTANCE TO COMPLETE EVALUATION: Min-Moderate modification of tasks or assist with assess necessary to complete an evaluation.  OT OCCUPATIONAL PROFILE AND HISTORY: Detailed assessment: Review of records and additional review of physical, cognitive, psychosocial history related to current functional performance.  CLINICAL DECISION MAKING: Moderate - several treatment options, min-mod task modification necessary  REHAB POTENTIAL: Good  EVALUATION COMPLEXITY: Moderate   PLAN:  OT FREQUENCY: 1x/week  OT DURATION: 6 wks  PLANNED INTERVENTIONS: 97168 OT Re-evaluation, 97535 self care/ADL training, 41324 therapeutic exercise, 97530 therapeutic activity, 97112 neuromuscular re-education, 97140 manual therapy, manual lymph drainage, passive range of motion, compression bandaging, coping strategies training, patient/family education, and DME and/or AE instructions  RECOMMENDED OTHER SERVICES: none currently  CONSULTED AND AGREED WITH PLAN OF CARE: Patient  Gloris Manchester, OTR/L, CLT 08/06/2023, 11:32 AM

## 2023-08-07 ENCOUNTER — Other Ambulatory Visit (HOSPITAL_COMMUNITY)
Admission: RE | Admit: 2023-08-07 | Discharge: 2023-08-07 | Disposition: A | Discharge status: Home / Self Care | Source: Ambulatory Visit | Attending: Obstetrics and Gynecology | Admitting: Obstetrics and Gynecology

## 2023-08-07 ENCOUNTER — Ambulatory Visit (INDEPENDENT_AMBULATORY_CARE_PROVIDER_SITE_OTHER): Payer: 59 | Admitting: Obstetrics and Gynecology

## 2023-08-07 ENCOUNTER — Encounter: Payer: Self-pay | Admitting: Obstetrics and Gynecology

## 2023-08-07 VITALS — BP 168/88 | HR 67 | Ht 63.0 in | Wt 286.7 lb

## 2023-08-07 DIAGNOSIS — Z7689 Persons encountering health services in other specified circumstances: Secondary | ICD-10-CM

## 2023-08-07 DIAGNOSIS — Z124 Encounter for screening for malignant neoplasm of cervix: Secondary | ICD-10-CM

## 2023-08-07 DIAGNOSIS — Z1151 Encounter for screening for human papillomavirus (HPV): Secondary | ICD-10-CM | POA: Diagnosis not present

## 2023-08-07 DIAGNOSIS — Z01419 Encounter for gynecological examination (general) (routine) without abnormal findings: Secondary | ICD-10-CM | POA: Insufficient documentation

## 2023-08-07 DIAGNOSIS — N898 Other specified noninflammatory disorders of vagina: Secondary | ICD-10-CM | POA: Diagnosis not present

## 2023-08-07 DIAGNOSIS — N823 Fistula of vagina to large intestine: Secondary | ICD-10-CM

## 2023-08-07 NOTE — Addendum Note (Signed)
 Addended by: Georgiana Shore R on: 08/07/2023 11:10 AM   Modules accepted: Orders

## 2023-08-07 NOTE — Progress Notes (Signed)
 HPI:      [name] is a 63 y.o. [id number] who LMP was Patient's last menstrual period was 10/15/2019.  Subjective:   She presents today because she believes that on 3 separate occasions in the last year she has noted stool coming from the vagina and the rectum at the same time.  She denies passage of gas per vagina or passage of liquid stools per vagina.  She denies vaginal discharge infection or odor. Of significant note, patient had a colonoscopy 2 weeks ago and had no issues with the prep for colonoscopy and no issues with liquid or gas coming from the vagina at the time of colonoscopy.  Obviously a fistula was not noted at this time. She looked up her symptoms on the Internet and is concerned that it is possible she has a rectovaginal fistula. She has had vaginal birth but says that it was not significantly traumatic and she does deny any prior vaginal surgery.  She reports no known history of diverticular disease. She is not currently sexually active. She says she is up-to-date on Pap smears. She has a recent history diagnosis and treatment of breast cancer.  She says she is currently cancer free and is taking Arimidex.    Hx: The following portions of the patient's history were reviewed and updated as appropriate:             She  has a past medical history of Allergy, Cancer (HCC), Cataract, Depression, Diabetes mellitus without complication (HCC), Hypertension, Morbid obesity with BMI of 50.0-59.9, adult (HCC), Obstructive sleep apnea, Osteoarthritis of both knees, Pre-diabetes, and Sleep apnea. She does not have any pertinent problems on file. She  has a past surgical history that includes Total knee arthroplasty (Right); Tubal ligation; Cataract extraction, bilateral (Bilateral, 2022); Colonoscopy (2015); Ankle surgery (Right); Tonsillectomy (Bilateral, 06/14/2021); Breast surgery; Eye surgery; Joint replacement (Right); Colonoscopy with propofol (N/A, 07/26/2023); and polypectomy  (07/26/2023). Her family history includes Hypertension in her brother and sister; Pancreatic cancer in her mother; Prostate cancer in her father. She  reports that she has never smoked. She has never used smokeless tobacco. She reports that she does not currently use alcohol. She reports that she does not use drugs. She has a current medication list which includes the following prescription(s): amlodipine, anastrozole, atorvastatin, gabapentin, gabapentin, losartan, meloxicam, nortriptyline, rybelsus, trazodone, and semaglutide. She is allergic to metformin and related and oxycontin [oxycodone hcl].       Review of Systems:  Review of Systems  Constitutional: Denied constitutional symptoms, night sweats, recent illness, fatigue, fever, insomnia and weight loss.  Eyes: Denied eye symptoms, eye pain, photophobia, vision change and visual disturbance.  Ears/Nose/Throat/Neck: Denied ear, nose, throat or neck symptoms, hearing loss, nasal discharge, sinus congestion and sore throat.  Cardiovascular: Denied cardiovascular symptoms, arrhythmia, chest pain/pressure, edema, exercise intolerance, orthopnea and palpitations.  Respiratory: Denied pulmonary symptoms, asthma, pleuritic pain, productive sputum, cough, dyspnea and wheezing.  Gastrointestinal: Denied, gastro-esophageal reflux, melena, nausea and vomiting.  Genitourinary: See HPI for additional information.  Musculoskeletal: Denied musculoskeletal symptoms, stiffness, swelling, muscle weakness and myalgia.  Dermatologic: Denied dermatology symptoms, rash and scar.  Neurologic: Denied neurology symptoms, dizziness, headache, neck pain and syncope.  Psychiatric: Denied psychiatric symptoms, anxiety and depression.  Endocrine: Denied endocrine symptoms including hot flashes and night sweats.   Meds:   Current Outpatient Medications on File Prior to Visit  Medication Sig Dispense Refill   amLODipine (NORVASC) 10 MG tablet Take 1 tablet by mouth  daily.     anastrozole (ARIMIDEX) 1 MG tablet Take 1 mg by mouth daily.     atorvastatin (LIPITOR) 20 MG tablet Take 20 mg by mouth at bedtime.     gabapentin (NEURONTIN) 300 MG capsule Take 300 mg by mouth. Take 1 capsule in the evening and 2 capsules nightly. May take additional 1 capsule nightly as needed     gabapentin (NEURONTIN) 600 MG tablet Take 600 mg by mouth 2 (two) times daily.     losartan (COZAAR) 50 MG tablet Take 50 mg by mouth daily.     meloxicam (MOBIC) 7.5 MG tablet Take 7.5 mg by mouth 2 (two) times daily.     nortriptyline (PAMELOR) 10 MG capsule Take 20 mg by mouth at bedtime.     Semaglutide (RYBELSUS) 3 MG TABS Take 1 tablet (3 mg total) by mouth daily. 30 tablet 0   traZODone (DESYREL) 50 MG tablet Take 50 mg by mouth at bedtime.     Semaglutide 7 MG TABS Take 1 tablet (7 mg total) by mouth daily. (Patient not taking: Reported on 07/17/2023) 30 tablet 3   No current facility-administered medications on file prior to visit.      Objective:     Vitals:   08/07/23 1023  BP: (!) 147/84  Pulse: 67   Filed Weights   08/07/23 1023  Weight: 286 lb 11.2 oz (130 kg)              Physical examination   Pelvic:   Vulva: Normal appearance.  No lesions.  Vagina: No lesions or abnormalities noted.  Moderate vaginal atrophy  Support: Normal pelvic support.  Urethra No masses tenderness or scarring.  Meatus Normal size without lesions or prolapse.  Cervix: Normal appearance.  No lesions.  Anus: Normal exam.  No lesions.  Perineum: Normal exam.  No lesions.   Systematic examination of the vagina shows no evidence of fistula.          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   Tenesmus (rectal) 06/29/2023   Chronic pain of both lower extremities 06/29/2023   Acute cough 06/29/2023   Need for hepatitis C screening test  06/29/2023   Screening for deficiency anemia 06/29/2023   Encounter to establish care with new doctor 06/29/2023   Neuropathy 06/29/2023   Chemotherapy-induced neutropenia (HCC) 12/05/2022   Thrombocytopenia (HCC) 12/05/2022   Lower urinary tract infectious disease 12/03/2022   Myocardial injury 12/03/2022   Obesity, Class III, BMI 40-49.9 (morbid obesity) (HCC) 12/03/2022   Breast cancer (HCC) 12/03/2022   HTN (hypertension) 12/03/2022   HLD (hyperlipidemia) 12/03/2022   OSA on CPAP 12/03/2022   Hypersensitivity reaction 10/24/2022   Invasive ductal carcinoma of breast, left (HCC) 09/26/2022   Endometrial thickening on ultrasound 07/05/2021   CKD stage G3a/A1, 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) 12/10/2019   Dyspareunia due to medical condition in female 10/24/2019   Postmenopause bleeding 03/08/2018   Pre-diabetes 11/22/2015   Depression 06/03/2014   Class 3 severe obesity without serious comorbidity with body mass index (BMI) of 50.0 to 59.9 in adult (HCC) 01/07/2014   Hypertension, benign 10/25/2011   Primary localized osteoarthrosis, lower leg 12/19/2007     1. Establishing care with new doctor, encounter for     Patient has concern regarding possibility of rectovaginal fistula.  No fistula noted at exam.  Fistula unlikely if not passing gas through the vagina.  Fistula unlikely if no issues with colonoscopy prep and with no evidence of fistula at the time of colonoscopy.  Passage of stool has occurred according to the patient approximately 3 times.    Plan:            1.  Offered her expectant management versus radiologic study with contrast to try to delineate/locate/diagnose a rectovaginal fistula should one be present.  She has chosen to employ expectant management.  If her issues with bowel movements continue would consider radiologic studies.  She will inform us.  2.  Patient asked that a Pap smear be performed while we were doing a vaginal examination  as she is due.  Pap was performed. Orders No orders of the defined types were placed in this encounter.   No orders of the defined types were placed in this encounter.     F/U  Return for Pt to contact us if symptoms worsen, We will contact her with any abnormal test results.  Elonda Husky, M.D. 08/07/2023 11:07 AM

## 2023-08-07 NOTE — Progress Notes (Signed)
 Patient presents today due to a potential fistula. She states over the past few years she is had occurences of stool passing through the vagina, it is not often. No additional concerns at this time.

## 2023-08-09 LAB — CYTOLOGY - PAP
Comment: NEGATIVE
Diagnosis: NEGATIVE
High risk HPV: NEGATIVE

## 2023-08-15 NOTE — Progress Notes (Signed)
 Respiratory panel ordered due to diarrhea.

## 2023-08-16 ENCOUNTER — Ambulatory Visit: Admitting: Occupational Therapy

## 2023-08-16 ENCOUNTER — Encounter: Payer: Self-pay | Admitting: *Deleted

## 2023-08-16 ENCOUNTER — Other Ambulatory Visit: Payer: Self-pay

## 2023-08-16 ENCOUNTER — Emergency Department
Admission: EM | Admit: 2023-08-16 | Discharge: 2023-08-16 | Disposition: A | Discharge status: Home / Self Care | Attending: Emergency Medicine | Admitting: Emergency Medicine

## 2023-08-16 ENCOUNTER — Emergency Department

## 2023-08-16 DIAGNOSIS — R1031 Right lower quadrant pain: Secondary | ICD-10-CM | POA: Diagnosis not present

## 2023-08-16 DIAGNOSIS — R109 Unspecified abdominal pain: Secondary | ICD-10-CM

## 2023-08-16 DIAGNOSIS — M546 Pain in thoracic spine: Secondary | ICD-10-CM | POA: Diagnosis not present

## 2023-08-16 LAB — URINALYSIS, ROUTINE W REFLEX MICROSCOPIC
Bilirubin Urine: NEGATIVE
Glucose, UA: NEGATIVE mg/dL
Hgb urine dipstick: NEGATIVE
Ketones, ur: NEGATIVE mg/dL
Nitrite: NEGATIVE
Protein, ur: NEGATIVE mg/dL
Specific Gravity, Urine: 1.016 (ref 1.005–1.030)
pH: 5 (ref 5.0–8.0)

## 2023-08-16 LAB — COMPREHENSIVE METABOLIC PANEL WITH GFR
ALT: 16 U/L (ref 0–44)
AST: 19 U/L (ref 15–41)
Albumin: 3.9 g/dL (ref 3.5–5.0)
Alkaline Phosphatase: 94 U/L (ref 38–126)
Anion gap: 8 (ref 5–15)
BUN: 15 mg/dL (ref 8–23)
CO2: 27 mmol/L (ref 22–32)
Calcium: 9.7 mg/dL (ref 8.9–10.3)
Chloride: 101 mmol/L (ref 98–111)
Creatinine, Ser: 1.08 mg/dL — ABNORMAL HIGH (ref 0.44–1.00)
GFR, Estimated: 58 mL/min — ABNORMAL LOW (ref >60)
Glucose, Bld: 98 mg/dL (ref 70–99)
Potassium: 4.2 mmol/L (ref 3.5–5.1)
Sodium: 136 mmol/L (ref 135–145)
Total Bilirubin: 0.5 mg/dL (ref 0.0–1.2)
Total Protein: 8 g/dL (ref 6.5–8.1)

## 2023-08-16 LAB — CBC
HCT: 36.6 % (ref 36.0–46.0)
Hemoglobin: 11.8 g/dL — ABNORMAL LOW (ref 12.0–15.0)
MCH: 28.9 pg (ref 26.0–34.0)
MCHC: 32.2 g/dL (ref 30.0–36.0)
MCV: 89.7 fL (ref 80.0–100.0)
Platelets: 171 10*3/uL (ref 150–400)
RBC: 4.08 MIL/uL (ref 3.87–5.11)
RDW: 14.6 % (ref 11.5–15.5)
WBC: 6.3 10*3/uL (ref 4.0–10.5)
nRBC: 0 % (ref 0.0–0.2)

## 2023-08-16 LAB — LIPASE, BLOOD: Lipase: 22 U/L (ref 11–51)

## 2023-08-16 MED ORDER — CYCLOBENZAPRINE HCL 10 MG PO TABS: 10.0000 mg | ORAL_TABLET | Freq: Three times a day (TID) | ORAL | 0 refills | Status: DC | PRN

## 2023-08-16 MED ORDER — IOHEXOL 300 MG/ML  SOLN
100.0000 mL | Freq: Once | INTRAMUSCULAR | Status: AC | PRN
  Administered 2023-08-16: 100 mL via INTRAVENOUS

## 2023-08-16 MED ORDER — ACETAMINOPHEN 500 MG PO TABS
1000.0000 mg | ORAL_TABLET | Freq: Once | ORAL | Status: AC
  Administered 2023-08-16: 1000 mg via ORAL
  Filled 2023-08-16: qty 2

## 2023-08-16 MED ORDER — LIDOCAINE 5 % EX PTCH
1.0000 | MEDICATED_PATCH | CUTANEOUS | Status: DC
  Administered 2023-08-16: 1 via TRANSDERMAL
  Filled 2023-08-16: qty 1

## 2023-08-16 NOTE — Progress Notes (Signed)
.  Patient discharged home. Discharge instructions, when to follow up, and prescriptions reviewed with patient.  Patient verbalized understanding. Patient left facility without escort. Patient refused discharge vitals

## 2023-08-16 NOTE — Discharge Instructions (Addendum)
 Your testing today was fortunately reassuring against an emergency cause for your symptoms.  I suspect you may have a muscle strain contributing to your symptoms.  You can take Tylenol and ibuprofen to help with your pain.  I have also sent a short course of a muscle relaxer.  Do not drive or operate machinery when taking this.  Follow with your primary care doctor for further evaluation.  Return to the ER for new or worsening symptoms.

## 2023-08-16 NOTE — ED Provider Notes (Signed)
 [facility] Provider Note    Event Date/Time   First MD Initiated Contact with Patient 08/16/23 1350     (approximate)   History   Abdominal Pain   HPI  Leslie Bautista is a 63 y.o. female who is otherwise healthy comes in with right mid back pain radiating into her right abdomen.  She denies ever having pain like this before denies any chest pain, shortness of breath.  States the pain has been going on intermittently since Saturday.  Does report walking a lot prior to the event.  Does report pain is worse with movements.  Is already taken naproxen, BC powder without relief in symptoms which is why she presented today.  She denies any fevers, vomiting, diarrhea associated with it.  She denies any rectal or urinary incontinence.  Denies any numbness, tingling.,  No saddle anesthesia.   Physical Exam   Triage Vital Signs: ED Triage Vitals  Encounter Vitals Group     BP 08/16/23 1215 (!) 145/86     Systolic BP Percentile --      Diastolic BP Percentile --      Pulse Rate 08/16/23 1215 79     Resp 08/16/23 1215 18     Temp 08/16/23 1215 98 F (36.7 C)     Temp Source 08/16/23 1215 Oral     SpO2 08/16/23 1215 97 %     Weight --      Height --      Head Circumference --      Peak Flow --      Pain Score 08/16/23 1214 10     Pain Loc --      Pain Education --      Exclude from Growth Chart --     Most recent vital signs: Vitals:   08/16/23 1215  BP: (!) 145/86  Pulse: 79  Resp: 18  Temp: 98 F (36.7 C)  SpO2: 97%     General: Awake, no distress.  CV:  Good peripheral perfusion.  Resp:  Normal effort.  Abd:  No distention.  Tender in the right lower abdomen. Other:  Able to lift both legs up off the bed but does report some pain in her back with lifting her legs.  Sensation intact bilaterally.  Tender in the right flank but no redness or rash noted.   ED Results / Procedures / Treatments   Labs (all labs ordered are listed, but only  abnormal results are displayed) Labs Reviewed  COMPREHENSIVE METABOLIC PANEL WITH GFR - Abnormal; Notable for the following components:      Result Value   Creatinine, Ser 1.08 (*)    GFR, Estimated 58 (*)    All other components within normal limits  CBC - Abnormal; Notable for the following components:   Hemoglobin 11.8 (*)    All other components within normal limits  URINALYSIS, ROUTINE W REFLEX MICROSCOPIC - Abnormal; Notable for the following components:   Color, Urine YELLOW (*)    APPearance HAZY (*)    Leukocytes,Ua LARGE (*)    Bacteria, UA RARE (*)    All other components within normal limits  LIPASE, BLOOD     RADIOLOGY I have reviewed the xray personally and interpreted    PROCEDURES:  Critical Care performed: No  Procedures   MEDICATIONS ORDERED IN ED: Medications  lidocaine (LIDODERM) 5 % 1 patch (1 patch Transdermal Patch Applied 08/16/23 1423)  acetaminophen (TYLENOL) tablet 1,000 mg (1,000 mg Oral Given  08/16/23 1423)  iohexol (OMNIPAQUE) 300 MG/ML solution 100 mL (100 mLs Intravenous Contrast Given 08/16/23 1409)     IMPRESSION / MDM / ASSESSMENT AND PLAN / ED COURSE  I reviewed the triage vital signs and the nursing notes.   Patient's presentation is most consistent with acute presentation with potential threat to life or bodily function.   Patient comes in with concerns for abdominal pain.  Tender in the right lower abdomen.  Will get CT scan with contrast to evaluate for appendicitis, diverticulitis, kidney stones.  Her urine does have some RBCs on it so this could be a kidney stone.  Lipase is normal CMP shows creatinine that is stable.  CBC without any evidence of white count elevation.  Patient does state that she has had a remote history of breast cancer not currently on any chemotherapy.  She denied any chest pain, upper abdominal pain to suggest ACS.  Patient be handed off pending CT imaging-patient declined anything else for pain given she will  be driving home    FINAL CLINICAL IMPRESSION(S) / ED DIAGNOSES   Final diagnoses:  Right lower quadrant abdominal pain     Rx / DC Orders   ED Discharge Orders     None        Note:  This document was prepared using Dragon voice recognition software and may include unintentional dictation errors.   Concha Se, MD 08/16/23 [phone]

## 2023-08-16 NOTE — ED Provider Notes (Signed)
 Care of this patient assumed from prior physician at 1500 pending CT and disposition. Please see prior physician note for further details.  Briefly, this is a 63 year old female who presented with right-sided abdominal pain radiating to flank.  Did have some muscular tenderness on exam.  Labs overall reassuring.  Urine overall not suggestive of infection.  Signed out to me pending CT and disposition.   CT resulted without acute findings.  Patient reassessed.  Updated on results of workup.  She is comfortable with discharge home.  Does report some continued tenderness over her back muscles, will DC with short course of muscle relaxer.  Strict return precautions provided.  Patient discharged in stable condition.   Trinna Post, MD 08/16/23 [phone]

## 2023-08-16 NOTE — ED Triage Notes (Signed)
 Pt is here for pain in her right side (mid axillary line) and to right flank and right lowe abdomen.

## 2023-08-17 LAB — URINE CULTURE

## 2023-08-23 DIAGNOSIS — R1312 Dysphagia, oropharyngeal phase: Secondary | ICD-10-CM | POA: Insufficient documentation

## 2023-08-23 DIAGNOSIS — M2578 Osteophyte, vertebrae: Secondary | ICD-10-CM | POA: Insufficient documentation

## 2023-08-30 ENCOUNTER — Encounter: Payer: Self-pay | Admitting: Obstetrics and Gynecology

## 2023-08-30 ENCOUNTER — Ambulatory Visit (INDEPENDENT_AMBULATORY_CARE_PROVIDER_SITE_OTHER): Admitting: Obstetrics and Gynecology

## 2023-08-30 VITALS — BP 131/78 | HR 64 | Ht 63.0 in | Wt 282.4 lb

## 2023-08-30 DIAGNOSIS — N823 Fistula of vagina to large intestine: Secondary | ICD-10-CM

## 2023-08-30 NOTE — Progress Notes (Signed)
 HPI:      Ms. Konner Warrior is a 63 y.o. [id number] who LMP was Patient's last menstrual period was 10/15/2019.  Subjective:   She presents today stating that her issue with passing stool through the vagina has returned.  She has had 1 more instance of this.  She noticed that after urinating her toilet tissue had stool on it.  She also states that she has been paying more attention and she believes she may be passing gas through the vagina as well.    Hx: The following portions of the patient's history were reviewed and updated as appropriate:             She  has a past medical history of Allergy, Cancer (HCC), Cataract, Depression, Diabetes mellitus without complication (HCC), Hypertension, Morbid obesity with BMI of 50.0-59.9, adult (HCC), Obstructive sleep apnea, Osteoarthritis of both knees, Pre-diabetes, and Sleep apnea. She does not have any pertinent problems on file. She  has a past surgical history that includes Total knee arthroplasty (Right); Tubal ligation; Cataract extraction, bilateral (Bilateral, 2022); Colonoscopy (2015); Ankle surgery (Right); Tonsillectomy (Bilateral, 06/14/2021); Breast surgery; Eye surgery; Joint replacement (Right); Colonoscopy with propofol (N/A, 07/26/2023); and polypectomy (07/26/2023). Her family history includes Hypertension in her brother and sister; Pancreatic cancer in her mother; Prostate cancer in her father. She  reports that she has never smoked. She has never used smokeless tobacco. She reports that she does not currently use alcohol. She reports that she does not use drugs. She has a current medication list which includes the following prescription(s): amlodipine, anastrozole, atorvastatin, cyclobenzaprine, gabapentin, gabapentin, losartan, meloxicam, nortriptyline, rybelsus, semaglutide, and trazodone. She is allergic to metformin and related and oxycontin [oxycodone hcl].       Review of Systems:  Review of Systems  Constitutional: Denied  constitutional symptoms, night sweats, recent illness, fatigue, fever, insomnia and weight loss.  Eyes: Denied eye symptoms, eye pain, photophobia, vision change and visual disturbance.  Ears/Nose/Throat/Neck: Denied ear, nose, throat or neck symptoms, hearing loss, nasal discharge, sinus congestion and sore throat.  Cardiovascular: Denied cardiovascular symptoms, arrhythmia, chest pain/pressure, edema, exercise intolerance, orthopnea and palpitations.  Respiratory: Denied pulmonary symptoms, asthma, pleuritic pain, productive sputum, cough, dyspnea and wheezing.  Gastrointestinal: Denied, gastro-esophageal reflux, melena, nausea and vomiting.  Genitourinary: See HPI for additional information.  Musculoskeletal: Denied musculoskeletal symptoms, stiffness, swelling, muscle weakness and myalgia.  Dermatologic: Denied dermatology symptoms, rash and scar.  Neurologic: Denied neurology symptoms, dizziness, headache, neck pain and syncope.  Psychiatric: Denied psychiatric symptoms, anxiety and depression.  Endocrine: Denied endocrine symptoms including hot flashes and night sweats.   Meds:   Current Outpatient Medications on File Prior to Visit  Medication Sig Dispense Refill   amLODipine (NORVASC) 10 MG tablet Take 1 tablet by mouth daily.     anastrozole (ARIMIDEX) 1 MG tablet Take 1 mg by mouth daily.     atorvastatin (LIPITOR) 20 MG tablet Take 20 mg by mouth at bedtime.     cyclobenzaprine (FLEXERIL) 10 MG tablet Take 1 tablet (10 mg total) by mouth 3 (three) times daily as needed for muscle spasms. 20 tablet 0   gabapentin (NEURONTIN) 300 MG capsule Take 300 mg by mouth. Take 1 capsule in the evening and 2 capsules nightly. May take additional 1 capsule nightly as needed     gabapentin (NEURONTIN) 600 MG tablet Take 600 mg by mouth 2 (two) times daily.     losartan (COZAAR) 50 MG tablet Take 50 mg by mouth daily.  meloxicam (MOBIC) 7.5 MG tablet Take 7.5 mg by mouth 2 (two) times daily.      nortriptyline (PAMELOR) 10 MG capsule Take 20 mg by mouth at bedtime.     Semaglutide (RYBELSUS) 3 MG TABS Take 1 tablet (3 mg total) by mouth daily. 30 tablet 0   Semaglutide 7 MG TABS Take 1 tablet (7 mg total) by mouth daily. 30 tablet 3   traZODone (DESYREL) 50 MG tablet Take 50 mg by mouth at bedtime.     No current facility-administered medications on file prior to visit.      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   Tenesmus (rectal) 06/29/2023   Chronic pain of both lower extremities 06/29/2023   Acute cough 06/29/2023   Need for hepatitis C screening test 06/29/2023   Screening for deficiency anemia 06/29/2023   Encounter to establish care with new doctor 06/29/2023   Neuropathy 06/29/2023   Chemotherapy-induced neutropenia (HCC) 12/05/2022   Thrombocytopenia (HCC) 12/05/2022   Lower urinary tract infectious disease 12/03/2022   Myocardial injury 12/03/2022   Obesity, Class III, BMI 40-49.9 (morbid obesity) (HCC) 12/03/2022   Breast cancer (HCC) 12/03/2022   HTN (hypertension) 12/03/2022   HLD (hyperlipidemia) 12/03/2022   OSA on CPAP 12/03/2022   Hypersensitivity reaction 10/24/2022   Invasive ductal carcinoma of breast, left (HCC) 09/26/2022   Endometrial thickening on ultrasound 07/05/2021   CKD stage G3a/A1, 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 Rectal vaginal fistula     Previous examination showed no evidence of rectovaginal fistula although they are often difficult to identify on examination.   Plan:            1.  I have spoken with radiology and they have recommended barium enema as a first start test.  I have ordered this for her.  2.  Consider GI consult if barium enema does not evidence fistula. Orders Orders Placed This Encounter  Procedures   PR COLON CA SCREEN;BARIUM ENEMA    No orders of the defined types were placed in this encounter.     F/U  Return for We will contact her with any abnormal test results.  Elonda Husky, M.D. 08/30/2023 10:44 AM

## 2023-08-30 NOTE — Progress Notes (Signed)
 Patient presents due to fistula symptoms returning. She states 3 weeks ago she noticed stool coming out vaginally, this was a one time occurrence. Reports she has been noticing she does pass gas vaginally as well.

## 2023-09-03 ENCOUNTER — Ambulatory Visit: Admitting: Occupational Therapy

## 2023-09-17 ENCOUNTER — Ambulatory Visit: Attending: Oncology | Admitting: Occupational Therapy

## 2023-09-17 DIAGNOSIS — I890 Lymphedema, not elsewhere classified: Secondary | ICD-10-CM | POA: Insufficient documentation

## 2023-09-17 DIAGNOSIS — M25622 Stiffness of left elbow, not elsewhere classified: Secondary | ICD-10-CM | POA: Insufficient documentation

## 2023-09-17 DIAGNOSIS — M6281 Muscle weakness (generalized): Secondary | ICD-10-CM | POA: Insufficient documentation

## 2023-09-17 DIAGNOSIS — M79602 Pain in left arm: Secondary | ICD-10-CM | POA: Diagnosis present

## 2023-09-17 NOTE — Therapy (Signed)
 OUTPATIENT OCCUPATIONAL THERAPY ORTHO TREATMENT/RECERT  Patient Name: Leslie Bautista MRN: [id number] DOB:[birth year], 63 y.o., female  PCP: [facility] REFERRING PROVIDER: Verlena Glenn  END OF SESSION:  OT End of Session - 09/17/23 1036     Visit Number 9    Number of Visits 12    Date for OT Re-Evaluation 10/29/23    OT Start Time 1035    OT Stop Time 1120    OT Time Calculation (min) 45 min    Activity Tolerance Patient tolerated treatment well    Behavior During Therapy WFL for tasks assessed/performed             Past Medical History:  Diagnosis Date   Allergy    Cancer (HCC)    breast cancer on left side   Cataract    Depression    Diabetes mellitus without complication (HCC)    Hypertension    Morbid obesity with BMI of 50.0-59.9, adult (HCC)    Obstructive sleep apnea    Osteoarthritis of both knees    Pre-diabetes    Sleep apnea    Past Surgical History:  Procedure Laterality Date   ANKLE SURGERY Right    ligament repair   BREAST SURGERY     CATARACT EXTRACTION, BILATERAL Bilateral 2022   COLONOSCOPY  2015   COLONOSCOPY WITH PROPOFOL  N/A 07/26/2023   Procedure: COLONOSCOPY WITH PROPOFOL ;  Surgeon: Selena Daily, MD;  Location: [facility] ENDOSCOPY;  Service: Gastroenterology;  Laterality: N/A;   EYE SURGERY     JOINT REPLACEMENT Right    Knee   POLYPECTOMY  07/26/2023   Procedure: POLYPECTOMY;  Surgeon: Selena Daily, MD;  Location: [facility] ENDOSCOPY;  Service: Gastroenterology;;   TONSILLECTOMY Bilateral 06/14/2021   Procedure: TONSILLECTOMY;  Surgeon: Rogers Clayman, MD;  Location: [facility] ORS;  Service: ENT;  Laterality: Bilateral;   TOTAL KNEE ARTHROPLASTY Right    TUBAL LIGATION     Patient Active Problem List   Diagnosis Date Noted   Hx of colonic polyps 07/26/2023   Polyp of ascending colon 07/26/2023   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   Tenesmus (rectal) 06/29/2023   Chronic pain of both lower extremities  06/29/2023   Acute cough 06/29/2023   Need for hepatitis C screening test 06/29/2023   Screening for deficiency anemia 06/29/2023   Encounter to establish care with new doctor 06/29/2023   Neuropathy 06/29/2023   Chemotherapy-induced neutropenia (HCC) 12/05/2022   Thrombocytopenia (HCC) 12/05/2022   Lower urinary tract infectious disease 12/03/2022   Myocardial injury 12/03/2022   Obesity, Class III, BMI 40-49.9 (morbid obesity) (HCC) 12/03/2022   Breast cancer (HCC) 12/03/2022   HTN (hypertension) 12/03/2022   HLD (hyperlipidemia) 12/03/2022   OSA on CPAP 12/03/2022   Hypersensitivity reaction 10/24/2022   Invasive ductal carcinoma of breast, left (HCC) 09/26/2022   Endometrial thickening on ultrasound 07/05/2021   CKD stage G3a/A1, 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) 12/10/2019   Dyspareunia due to medical condition in female 10/24/2019   Postmenopause bleeding 03/08/2018   Pre-diabetes 11/22/2015   Depression 06/03/2014   Class 3 severe obesity without serious comorbidity with body mass index (BMI) of 50.0 to 59.9 in adult (HCC) 01/07/2014   Hypertension, benign 10/25/2011   Primary localized osteoarthrosis, lower leg 12/19/2007    ONSET DATE: December 2024  REFERRING DIAG: lymphedema Invasive ductal carcinoma of left breast  THERAPY DIAG:  Lymphedema, not elsewhere classified  Muscle weakness (generalized)  Stiffness of joint, upper arm, left  Pain  in left arm  Rationale for Evaluation and Treatment: Rehabilitation  SUBJECTIVE:   SUBJECTIVE STATEMENT: You not going to be happy with me.  I did not see you for about 6 weeks.  I did not really do what I was supposed to.  Exercises and using my compression on my left breast.  My neuropathy is really getting bad.  And  my L thumb is clicking and locking.  I cannot make a tight fist.  Pt accompanied by: self  PERTINENT HISTORY: Pt with complex medical history over the last year with recent cancer  diagnosis.  She underwent surgery on 08/24/22 for a left lumpectomy with SLNB and b/l oncoplastic rearrangement of tissue with reduction. She underwent chemotherapy with last dose given on 12/19/22 after 3 cycles and overall poor tolerance.  Radiation to left breast with boost to tumor bed 02/19/2023 and completed on 03/16/2023.    PRECAUTIONS: None   WEIGHT BEARING RESTRICTIONS: No  PAIN:  Are you having pain?  No pain stiffness and tightness  under my L armpitFALLS: Has patient fallen in last 6 months? No  LIVING ENVIRONMENT: Lives with: lives with their family Lives in: House/apartment Has following equipment at home: None  PLOF: Independent  PATIENT GOALS: Pt would like to decrease the pain and stiffness of her left arm, decrease the pulling towards her back   NEXT MD VISIT: TBD  OBJECTIVE:  Note: Objective measures were completed at Evaluation unless otherwise noted.  HAND DOMINANCE: Right  ADLs: Overall ADLs: Pt able to complete basic self care tasks however has pain in left UE, difficulty with reaching tasks. She reports she feels her left arm is thick and tight, pulling towards her back along scar line.  She currently wears a compression bra and has decreased sensation in the area of her surgery.  She is currently not working.  She lives with her daughters and grandson.  She is able to engage in light homemaking tasks, cooking and is still able to drive.  Her daughters assist with heavier household chores.    FUNCTIONAL OUTCOME MEASURES: FOTO: TBD  UPPER EXTREMITY ROM:     Active ROM Right eval Left eval L 06/08/23 L 06/18/23 L 07/06/23  Shoulder flexion 150 128 160 170 140 coming in  Shoulder abduction 130 121 140 150 pull lat trunk 135 coming in   Shoulder adduction       Shoulder extension       Shoulder internal rotation       Shoulder external rotation       Elbow flexion WNL WNL     Elbow extension       Wrist flexion WNL WNL     Wrist extension       Wrist  ulnar deviation       Wrist radial deviation       Wrist pronation WNL WNL     Wrist supination WNL WNL     (Blank rows = not tested)   UPPER EXTREMITY MMT:     MMT Right eval Left eval  Shoulder flexion    Shoulder abduction    Shoulder adduction    Shoulder extension    Shoulder internal rotation    Shoulder external rotation    Middle trapezius    Lower trapezius    Elbow flexion    Elbow extension    Wrist flexion    Wrist extension    Wrist ulnar deviation    Wrist radial deviation    Wrist  pronation    Wrist supination    (Blank rows = not tested)  HAND FUNCTION: Grip strength: Right: 55 lbs; Left: 40 lbs, Lateral pinch: Right: 11 lbs, Left: 9 lbs, and 3 point pinch: Right: 7 lbs, Left: 5 lbs 06/26/23 Grip strength: Right: 55 lbs; Left: 43 lbs, Lateral pinch: Right: 14 lbs, Left: 14 lbs, and 3 point pinch: Right: 10 lbs, Left: 11 lbs  COORDINATION: Intact however 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     EDEMA: See flowsheet for circumferential measurements of bilateral UEs.  COGNITION: Overall cognitive status: Within functional limits for tasks assessed  TREATMENT DATE: 09/17/2022     Patient reports she has appointment with surgeon on 10/03/23 to look at her left breast swelling and may be drainage. She has not been wearing her Jovi pack breast pad. She has been wearing her to put back on the left axilla and lateral breast. Pt reports tenderness  and discomfort on the breast is much better.   Continues to have lymphedema in the left breast.  Increased heaviness and fullness in the left breast still. Decrease fibrosis. Bilateral upper extremities appear to be within normal limits circumference.  Patient denies any symptoms of lymphedema in the upper extremity See flowsheet Patient is left-hand  dominant. Recommended 6 weeks ago for patient to unilateral postmastectomy Jovi pack to wear as much as she can for 2 to 4 weeks on left breast. Patient reports has not been doing it.  She is bringing in her compression bra and Jovi pack Reviewed with patient wearing of Jovi pack.  Patient has a hard time with her neuropathy to fasten her compression bra. Recommended for patient even to use it under light compression like a camisole. But to try and wear it at least every day 6 to 8 hours until seeing the surgeon on the left breast and thoracic     Therapeutic Exercises:  Recommend for patient to continue with active assisted range of motion on the wall daily her active assisted range of motion shoulder flexion and abduction. 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  Patient showed decreased digit flexion as well as composite flexion. Increased tenderness and pain in the left A1 pulley of thumb and fourth digit. And triggering of left thumb and fourth digit  PATIENT EDUCATION: Education details: ROM exercises for bilateral UEs, with focus on left.  Scar massage, gentle massage around breast and towards back where she has the most tightness and stiffness. Person educated: Patient Education method: Medical illustrator Education comprehension: verbalized understanding and returned demonstration  HOME EXERCISE PROGRAM: Pt to perform gentle range of motion and stretching exercises at home with assistance from her daughters to massage along scar line and areas of tightness which are limiting her current motion in her arm.  She reports feeling of "thickness" however after treatment this date she reported area felt much better and pain decreased.    GOALS: Goals reviewed with patient? Yes  SHORT TERM GOALS: Target date: 06/01/2023  Pt to demonstrate understanding  of home exercise program with modified independence. Baseline: no current program at eval. Goal status: Met   LONG TERM GOALS: Target date: 11/12/2023  Pt will demonstrate decrease in pain, 2/10 or less in her left UE during  self care tasks. Baseline: currently increased pain and tightness in left UE which makes her self care tasks difficult Goal status: Met  2.  Pt to demonstrate improved left shoulder flexion by 10 degrees to facilitate reaching to obtain items from closet and shelves. Baseline: difficulty with reach at eval Goal status: Met  3.  Pt to demonstrate understanding of home program to manage symptoms of lymphedema including use of compression garments and/or pump if needed. Baseline: no current knowledge. Goal status: Progressing  ASSESSMENT:  CLINICAL IMPRESSION: Patient is a 63 y.o. female who was seen for occupational therapy for lymphedema of left UE with pain in left arm. Pt is s/p lumpectomy in April of 2024 followed by 3 rounds of chemotherapy and 20 radiation treatments.  She also underwent a breast reduction on the right at the same time of surgery.  Patient was only seen for 2 visits in the last 10 weeks.  Patient was able to maintain range of motion in left shoulder flexion and abduction.  Still continues to lack endrange.  As well as decreased strength.  Pain improved greatly in L breast with ROM - mostly now some fullness, swelling and tightness in left breast.  Patient was educated in self MLD.  But report not really following through with it as well as unilateral postmastectomy Jovi pack and compression bra had not been wearing it.  Reviewed with patient again the importance of wearing it for the next 2 to 3 weeks until seeing the surgeon daily 6 to 8 hours.  Patient do have increased neuropathy and limiting her ability to fasten compression bra.  Recommend for her to use even a camisole with light compression.  Patient to show this date decrease digit flexion and  range of motion as well as trigger thumb and fourth digit on left hand.  Referred patient to see Dr. Kubinski for possible shot for trigger fingers.  Reviewed with her home program as well as modifications to be independent.  Did discuss with patient in the past transitioning and looking into Silver sneakers at the [facility].  Patient to follow-up with me after appointment with surgeon as well as seeing orthopedics..  Pt to continue to  benefit from skilled OT to decrease risk for lymphedema, increased motion and strength in left upper extremity as well as being independent in ADLs and IADLs- to maximize safety and independence in necessary daily tasks.    PERFORMANCE DEFICITS: in functional skills including ADLs, IADLs, coordination, dexterity, sensation, edema, ROM, strength, pain, fascial restrictions, flexibility, decreased knowledge of use of DME, and UE functional use,  and psychosocial skills including environmental adaptation, habits, and routines and behaviors.   IMPAIRMENTS: are limiting patient from ADLs, IADLs, rest and sleep, and social participation.   COMORBIDITIES: may have co-morbidities  that affects occupational performance. Patient will benefit from skilled OT to address above impairments and improve overall function.  MODIFICATION OR ASSISTANCE TO COMPLETE EVALUATION: Min-Moderate modification of tasks or assist with assess necessary to complete an evaluation.  OT OCCUPATIONAL PROFILE AND HISTORY: Detailed assessment: Review of records and additional review of physical, cognitive, psychosocial history related to current functional performance.  CLINICAL DECISION MAKING: Moderate - several treatment options, min-mod task modification necessary  REHAB POTENTIAL: Good  EVALUATION COMPLEXITY: Moderate   PLAN:  OT FREQUENCY: Biweekly  OT DURATION: 8 wks  PLANNED INTERVENTIONS: 97168 OT Re-evaluation, 97535 self care/ADL training, 40981 therapeutic exercise, 97530 therapeutic  activity, 97112 neuromuscular re-education, 97140 manual therapy, manual lymph drainage, passive  range of motion, compression bandaging, coping strategies training, patient/family education, and DME and/or AE instructions  RECOMMENDED OTHER SERVICES: none currently  CONSULTED AND AGREED WITH PLAN OF CARE: Patient  Ihor Makua, OTR/L, CLT 09/17/2023, 12:19 PM

## 2023-09-20 ENCOUNTER — Ambulatory Visit: Payer: Self-pay

## 2023-09-20 NOTE — Telephone Encounter (Addendum)
 Copied from CRM [id number]. Topic: Clinical - Red Word Triage >> Sep 20, 2023 11:27 AM Elle L wrote: Red Word that prompted transfer to Nurse Triage: The patient is a diabetic and is having numbness and tingling in her feet.   Chief Complaint: diabetes foot problem Symptoms: numbness/tingling in her feet and hands as her normal, "toes turning black" and more complete numbness in toes since November, intermittent sharp pains in legs 5-6/10, limping/walk is affected Frequency: continual Pertinent Negatives: Patient denies redness/rash/swelling to legs, fever, odor from toes, chest pain, SOB, weakness, known prior injury, severe pain Disposition: [] 911 / [] ED /[] Urgent Care (no appt availability in office) / [] Appointment(In office/virtual)/ []  [facility]/ [] Home Care/ [x] Refused Recommended Disposition /[] [facility]/ []  Follow-up with PCP Additional Notes: Pt reporting that she has neuropathy in her feet and hands, pt concerned about her feet, been going to neurologist but "done nothing for my feet but give me meds," meds are not working and confirms no one has examined her feet. Pt reporting that her toes are turning black with more complete numbness in her toes than normal and numbness goes to "halfway down foot" towards the heel from the toes, confirms these symptoms started in November or so, finished radiation and chemo in October. Pt confirms she also has diabetes but is requesting more education about her diabetes, "don't know what kind," etc., PCP "just gave me meds." Pt reporting she also wants to know what other options she has besides neuro, thinking about vein and vascular or podiatrist.. Advised pt be examined in next 4 hours for her symptoms, no PCP availability today, advised UC, pt refusing exam today due to her schedule and wants to follow up with PCP next week. Attempted to call [facility] for other appt options, no answer from [facility]. Advised that sending HP message to office  for call back with further recommendations and appt options. Advised UC when able or ED if worsening or new symptoms. Pt verbalized understanding.  Reason for Disposition  [1] Purple or black skin on foot or toe AND [2] new or increased  Answer Assessment - Initial Assessment Questions 1. SYMPTOM: "What's the main symptom you're concerned about?" (e.g., rash, sore, callus, drainage, numbness)     Cancer pt, just recently got through radiation and chemo in Oct, neuropathy from chemo Going to neurologist, done nothing for my feet but give me meds, not working Want to see if any other options Wants a foot exam so can get some shoes Have appt with neuro tomorrow May need to see vein and vascular Some sharp pains in legs sometimes, like nerve jumping 5-6/10 multiple times a day, both legs Toes are turning black More complete numbness in toes than normal and halfway down foot to heel completely numb November or so started turning black Neurologist not even looked at my Careers information officer NP, pt needing education about diabetes not just med Walk is affected, limping  Protocols used: Diabetes - Foot Problems and Questions-A-AH

## 2023-09-21 NOTE — Telephone Encounter (Signed)
 Please schedule patient for appointment with next available provider to discuss referral options and answer questions regarding diabetes treatment plan

## 2023-09-21 NOTE — Telephone Encounter (Signed)
 Appt made for 09/25/2023 at 820 with Dr. Shann Darnel

## 2023-09-25 ENCOUNTER — Ambulatory Visit (INDEPENDENT_AMBULATORY_CARE_PROVIDER_SITE_OTHER): Admitting: Family Medicine

## 2023-09-25 ENCOUNTER — Encounter: Payer: Self-pay | Admitting: Family Medicine

## 2023-09-25 VITALS — BP 144/61 | HR 57 | Resp 16 | Wt 279.7 lb

## 2023-09-25 DIAGNOSIS — I10 Essential (primary) hypertension: Secondary | ICD-10-CM | POA: Diagnosis not present

## 2023-09-25 DIAGNOSIS — Z7984 Long term (current) use of oral hypoglycemic drugs: Secondary | ICD-10-CM | POA: Diagnosis not present

## 2023-09-25 DIAGNOSIS — E119 Type 2 diabetes mellitus without complications: Secondary | ICD-10-CM | POA: Insufficient documentation

## 2023-09-25 DIAGNOSIS — E1159 Type 2 diabetes mellitus with other circulatory complications: Secondary | ICD-10-CM

## 2023-09-25 MED ORDER — LOSARTAN POTASSIUM-HCTZ 50-12.5 MG PO TABS: 1.0000 | ORAL_TABLET | Freq: Every day | ORAL | 1 refills | Status: DC

## 2023-09-25 NOTE — Progress Notes (Signed)
 Established patient visit   Patient: Leslie Bautista   DOB: 01-Mar-1961   63 y.o. Female  MRN: [id number] Visit Date: 09/25/2023  Today's healthcare provider: Jeralene Mom, MD   Chief Complaint  Patient presents with   Diabetes    Patient is concerns about how to manage her diabetes.   Subjective    HPI  Patient of Rufus Council presents with questions about diabetes which was diagnosed at her new patient visit on 2/7 with an A1c of 6.5%. she was started on Rybelsus  which she has titrated up 7mg  and tolerating well, although her daughter reports she eats less frequently. She generally tried to avoid sweets in her diet. No regular exercise. She has many questions about diet and management of diabetes. She has follow up appointment scheduled with Rufus Council on June 9.   Lab Results  Component Value Date   HGBA1C 6.5 (H) 06/29/2023   HGBA1C 5.9 (H) 10/22/2018   Wt Readings from Last 3 Encounters:  09/25/23 279 lb 11.2 oz (126.9 kg)  08/30/23 282 lb 6.4 oz (128.1 kg)  08/16/23 286 lb 9.6 oz (130 kg)   BP Readings from Last 3 Encounters:  09/25/23 (!) 144/61  08/30/23 131/78  08/16/23 (!) 145/86   She is also noted to have uncontrolled systolic blood pressure. She was apparently on amlodipine years ago but reports her previous PCP changed her to losartan  a few years ago.   Outpatient Medications Prior to Visit  Medication Sig Note   anastrozole (ARIMIDEX) 1 MG tablet Take 1 mg by mouth daily.    atorvastatin  (LIPITOR) 20 MG tablet Take 20 mg by mouth at bedtime.    cyclobenzaprine  (FLEXERIL ) 10 MG tablet Take 1 tablet (10 mg total) by mouth 3 (three) times daily as needed for muscle spasms.    DULoxetine (CYMBALTA) 30 MG capsule Take 30 mg by mouth daily.    gabapentin  (NEURONTIN ) 300 MG capsule Take 300 mg by mouth. Take 1 capsule in the evening and 2 capsules nightly. May take additional 1 capsule nightly as needed    gabapentin  (NEURONTIN ) 600 MG tablet Take 600  mg by mouth 2 (two) times daily.    losartan  (COZAAR ) 50 MG tablet Take 50 mg by mouth daily.    meloxicam  (MOBIC ) 7.5 MG tablet Take 7.5 mg by mouth 2 (two) times daily.    nortriptyline (PAMELOR) 10 MG capsule Take 20 mg by mouth at bedtime.    omeprazole (PRILOSEC) 20 MG capsule Take 20 mg by mouth every morning.    Semaglutide  7 MG TABS Take 1 tablet (7 mg total) by mouth daily.    traZODone (DESYREL) 50 MG tablet Take 50 mg by mouth at bedtime.    Vitamin D, Ergocalciferol, (DRISDOL) 1.25 MG (50000 UNIT) CAPS capsule Take 1,250 mcg by mouth once a week.    No facility-administered medications prior to visit.       Objective    BP (!) 144/61 (BP Location: Left Wrist, Patient Position: Sitting, Cuff Size: Large)   Pulse (!) 57   Resp 16   Wt 279 lb 11.2 oz (126.9 kg)   LMP 10/15/2019 Comment: Patient states there is no way she is pregnant.   SpO2 100%   BMI 49.55 kg/m    Physical Exam   General appearance: Obese female, cooperative and in no acute distress Head: Normocephalic, without obvious abnormality, atraumatic Respiratory: Respirations even and unlabored, normal respiratory rate Extremities: All extremities are intact.  Skin: Skin  color, texture, turgor normal. No rashes seen  Psych: Appropriate mood and affect. Neurologic: Mental status: Alert, oriented to person, place, and time, thought content appropriate.    Assessment & Plan     1. Diabetes mellitus without complication (HCC) (Primary) Recently diagnosed in February 2025 with many questions about management. Doing well with Rybelsus  7mg . - Ambulatory referral to diabetic education - Follow up Mccoy Spell in June as already scheduled.   2. Primary hypertension Changed from amlodipine to losartan  by her previously PCP several years ago for unclear reasons.  Will change losartan  50 to losartan -hydrochlorothiazide (HYZAAR) 50-12.5 MG tablet; Take 1 tablet by mouth daily.  Dispense: 90 tablet; Refill: 1         Jeralene Mom, MD  [facility] [phone] (phone) [phone] (fax)  [facility]

## 2023-10-04 ENCOUNTER — Ambulatory Visit: Attending: Oncology | Admitting: Occupational Therapy

## 2023-10-04 DIAGNOSIS — M6281 Muscle weakness (generalized): Secondary | ICD-10-CM | POA: Insufficient documentation

## 2023-10-04 DIAGNOSIS — M25622 Stiffness of left elbow, not elsewhere classified: Secondary | ICD-10-CM | POA: Insufficient documentation

## 2023-10-04 DIAGNOSIS — I890 Lymphedema, not elsewhere classified: Secondary | ICD-10-CM | POA: Diagnosis present

## 2023-10-04 DIAGNOSIS — M79602 Pain in left arm: Secondary | ICD-10-CM | POA: Insufficient documentation

## 2023-10-04 NOTE — Therapy (Signed)
 OUTPATIENT OCCUPATIONAL THERAPY ORTHO TREATMENT  Patient Name: Leslie Bautista MRN: [id number] DOB:[birth year], 63 y.o., female  PCP: [facility] REFERRING PROVIDER: Verlena Glenn  END OF SESSION:  OT End of Session - 10/04/23 1622     Visit Number 10    Number of Visits 13    Date for OT Re-Evaluation 11/12/23    OT Start Time 1620    Activity Tolerance Patient tolerated treatment well    Behavior During Therapy [facility] for tasks assessed/performed             Past Medical History:  Diagnosis Date   Allergy    Cancer (HCC)    breast cancer on left side   Cataract    Depression    Diabetes mellitus without complication (HCC)    Hypertension    Morbid obesity with BMI of 50.0-59.9, adult (HCC)    Obstructive sleep apnea    Osteoarthritis of both knees    Pre-diabetes    Sleep apnea    Past Surgical History:  Procedure Laterality Date   ANKLE SURGERY Right    ligament repair   BREAST SURGERY     CATARACT EXTRACTION, BILATERAL Bilateral 2022   COLONOSCOPY  2015   COLONOSCOPY WITH PROPOFOL  N/A 07/26/2023   Procedure: COLONOSCOPY WITH PROPOFOL ;  Surgeon: Selena Daily, MD;  Location: [facility] ENDOSCOPY;  Service: Gastroenterology;  Laterality: N/A;   EYE SURGERY     JOINT REPLACEMENT Right    Knee   POLYPECTOMY  07/26/2023   Procedure: POLYPECTOMY;  Surgeon: Selena Daily, MD;  Location: [facility] ENDOSCOPY;  Service: Gastroenterology;;   TONSILLECTOMY Bilateral 06/14/2021   Procedure: TONSILLECTOMY;  Surgeon: Rogers Clayman, MD;  Location: [facility] ORS;  Service: ENT;  Laterality: Bilateral;   TOTAL KNEE ARTHROPLASTY Right    TUBAL LIGATION     Patient Active Problem List   Diagnosis Date Noted   Diabetes mellitus without complication (HCC) 09/25/2023   Hx of colonic polyps 07/26/2023   Polyp of ascending colon 07/26/2023   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   Tenesmus (rectal) 06/29/2023   Chronic pain of both lower extremities 06/29/2023    Acute cough 06/29/2023   Need for hepatitis C screening test 06/29/2023   Screening for deficiency anemia 06/29/2023   Encounter to establish care with new doctor 06/29/2023   Neuropathy 06/29/2023   Chemotherapy-induced neutropenia (HCC) 12/05/2022   Thrombocytopenia (HCC) 12/05/2022   Lower urinary tract infectious disease 12/03/2022   Myocardial injury 12/03/2022   Obesity, Class III, BMI 40-49.9 (morbid obesity) 12/03/2022   Breast cancer (HCC) 12/03/2022   HLD (hyperlipidemia) 12/03/2022   OSA on CPAP 12/03/2022   Hypersensitivity reaction 10/24/2022   Invasive ductal carcinoma of breast, left (HCC) 09/26/2022   Endometrial thickening on ultrasound 07/05/2021   CKD stage G3a/A1, 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) 12/10/2019   Dyspareunia due to medical condition in female 10/24/2019   Postmenopause bleeding 03/08/2018   Pre-diabetes 11/22/2015   Depression 06/03/2014   Class 3 severe obesity without serious comorbidity with body mass index (BMI) of 50.0 to 59.9 in adult 01/07/2014   HTN (hypertension) 10/25/2011   Primary localized osteoarthrosis, lower leg 12/19/2007    ONSET DATE: December 2024  REFERRING DIAG: lymphedema Invasive ductal carcinoma of left breast  THERAPY DIAG:  Lymphedema, not elsewhere classified  Muscle weakness (generalized)  Stiffness of joint, upper arm, left  Pain in left arm  Rationale for Evaluation and Treatment: Rehabilitation  SUBJECTIVE:   SUBJECTIVE  STATEMENT: I am having neck surgery.  I have bone spurs and they can remove - I have trouble swallowing -and the day before that I seen the neurologist.  I did see Dr. Windle Hatch and he recommended to nerve conduction.  The numbness is still in my hands.  In the trigger fingers.  My ring fingers are still worse in my thumb.  I could not take the shot.  I am sorry.  I did see the breast cancer surgeon and she told me to wear compression and I am seeing her in 6  months.  Pt accompanied by: self  PERTINENT HISTORY: Pt with complex medical history over the last year with recent cancer diagnosis.  She underwent surgery on 08/24/22 for a left lumpectomy with SLNB and b/l oncoplastic rearrangement of tissue with reduction. She underwent chemotherapy with last dose given on 12/19/22 after 3 cycles and overall poor tolerance.  Radiation to left breast with boost to tumor bed 02/19/2023 and completed on 03/16/2023.    PRECAUTIONS: None   WEIGHT BEARING RESTRICTIONS: No  PAIN:  Are you having pain?  No pain stiffness and tightness  under my L armpit FALLS: Has patient fallen in last 6 months? No  LIVING ENVIRONMENT: Lives with: lives with their family Lives in: House/apartment Has following equipment at home: None  PLOF: Independent  PATIENT GOALS: Pt would like to decrease the pain and stiffness of her left arm, decrease the pulling towards her back   NEXT MD VISIT: TBD  OBJECTIVE:  Note: Objective measures were completed at Evaluation unless otherwise noted.  HAND DOMINANCE: Right  ADLs: Overall ADLs: Pt able to complete basic self care tasks however has pain in left UE, difficulty with reaching tasks. She reports she feels her left arm is thick and tight, pulling towards her back along scar line.  She currently wears a compression bra and has decreased sensation in the area of her surgery.  She is currently not working.  She lives with her daughters and grandson.  She is able to engage in light homemaking tasks, cooking and is still able to drive.  Her daughters assist with heavier household chores.    FUNCTIONAL OUTCOME MEASURES: FOTO: TBD  UPPER EXTREMITY ROM:     Active ROM Right eval Left eval L 06/08/23 L 06/18/23 L 07/06/23  Shoulder flexion 150 128 160 170 140 coming in  Shoulder abduction 130 121 140 150 pull lat trunk 135 coming in   Shoulder adduction       Shoulder extension       Shoulder internal rotation       Shoulder  external rotation       Elbow flexion WNL WNL     Elbow extension       Wrist flexion WNL WNL     Wrist extension       Wrist ulnar deviation       Wrist radial deviation       Wrist pronation WNL WNL     Wrist supination WNL WNL     (Blank rows = not tested)   UPPER EXTREMITY MMT:     MMT Right eval Left eval  Shoulder flexion    Shoulder abduction    Shoulder adduction    Shoulder extension    Shoulder internal rotation    Shoulder external rotation    Middle trapezius    Lower trapezius    Elbow flexion    Elbow extension    Wrist flexion  Wrist extension    Wrist ulnar deviation    Wrist radial deviation    Wrist pronation    Wrist supination    (Blank rows = not tested)  HAND FUNCTION: Grip strength: Right: 55 lbs; Left: 40 lbs, Lateral pinch: Right: 11 lbs, Left: 9 lbs, and 3 point pinch: Right: 7 lbs, Left: 5 lbs 06/26/23 Grip strength: Right: 55 lbs; Left: 43 lbs, Lateral pinch: Right: 14 lbs, Left: 14 lbs, and 3 point pinch: Right: 10 lbs, Left: 11 lbs  COORDINATION: Intact however 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     EDEMA: See flowsheet for circumferential measurements of bilateral UEs.  COGNITION: Overall cognitive status: Within functional limits for tasks assessed  TREATMENT DATE: 10/04/2023    Patient arrived did see her breast surgeon.  Because of having neck surgery early June.  Needs to continue wearing compression bra and the Jovi pack.  She has been wearing her chip back.  Pt reports tenderness  and discomfort on the left breast is much better.   .  Left shoulder active range of motion within functional limits.  Strength within functional limits.  Denying any pain. Bilateral upper extremities appear to be within normal limits circumference.  Patient denies any symptoms of lymphedema in the  upper extremity  Patient is left-hand dominant. Reviewed with patient wearing of Jovi pack.  Patient has a hard time with her neuropathy to fasten her compression bra. Recommended for patient even to use it under light compression like a camisole. Or a larger sports bra that is easier to put on      Therapeutic Exercises:  Recommend for patient to continue with active assisted range of motion on the wall daily her active assisted range of motion shoulder flexion and abduction. 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  Patient did see Dr. Windle Hatch.  Was fitted with bilateral wrist braces to wear at nighttime and 2 hours during the day.   Patient did get something for pain and neuropathy at [facility] that she is rubbing on her hands twice a day.  As well as her feet.   Increased tenderness and pain in the left A1 pulley of left thumb and right and left fourth digit. Patient reports triggering but did not see any triggering in the session. Pain with composite flexion Discussed with patient that she is doing well with left shoulder motion she is doing well with wearing compression for left breast until she see the surgeon and she is having neck surgery.  PATIENT EDUCATION: Education details: ROM exercises for bilateral UEs, with focus on left.  Scar massage, gentle massage around breast and towards back where she has the most tightness and stiffness. Person educated: Patient Education method: Medical illustrator Education comprehension: verbalized understanding and returned demonstration  HOME EXERCISE PROGRAM: Pt to perform gentle range of motion and stretching exercises at home with assistance from her daughters to massage along scar line and areas of tightness which are limiting her current motion in her arm.  She reports feeling of "thickness" however after  treatment this date she reported area felt much better and pain decreased.    GOALS: Goals reviewed with patient? Yes  SHORT TERM GOALS: Target date: 06/01/2023  Pt to demonstrate understanding of home exercise  program with modified independence. Baseline: no current program at eval. Goal status: Met   LONG TERM GOALS: Target date: 11/12/2023  Pt will demonstrate decrease in pain, 2/10 or less in her left UE during self care tasks. Baseline: currently increased pain and tightness in left UE which makes her self care tasks difficult Goal status: Met  2.  Pt to demonstrate improved left shoulder flexion by 10 degrees to facilitate reaching to obtain items from closet and shelves. Baseline: difficulty with reach at eval Goal status: Met  3.  Pt to demonstrate understanding of home program to manage symptoms of lymphedema including use of compression garments and/or pump if needed. Baseline: no current knowledge. Goal status: Progressing  ASSESSMENT:  CLINICAL IMPRESSION: Patient is a 63 y.o. female who was seen for occupational therapy for lymphedema of left UE with pain in left arm. Pt is s/p lumpectomy in April of 2024 followed by 3 rounds of chemotherapy and 20 radiation treatments.  She also underwent a breast reduction on the right at the same time of surgery.   Patient was able to maintain range of motion in left shoulder flexion and abduction.  Still continues to lack endrange.  But this date active range of motion and strength within functional limits deny pain in the left shoulder and breast.  Patient did see surgeon.  Will check back with her 6 months.  She is having neck surgery early June.  Reinforced with patient again to follow through with unilateral postmastectomy Jovi pack and wear maybe a sports bra instead of the compression bra if having a hard time with her hands donning.  Patient do have increased neuropathy and limiting her ability to fasten compression bra.   Recommend for her to use even a camisole or sports bra with light compression.  Patient did see Dr. Windle Hatch.  Patient was referred to neurology and for possible nerve conduction.  Patient declined a shot for trigger thumb.  Reviewed with her home program as well as modifications to be independent.  Also reviewed with her some home exercises for her bilateral hand range of motion and pain.  Did discuss with patient in the past transitioning and looking into Silver sneakers at the [facility].  But patient to hold off now until cleared from neck surgery -patient can continue at home.  And can discuss with her orthopedic or neurology if she wants to be referred for hand therapy.  PERFORMANCE DEFICITS: in functional skills including ADLs, IADLs, coordination, dexterity, sensation, edema, ROM, strength, pain, fascial restrictions, flexibility, decreased knowledge of use of DME, and UE functional use,  and psychosocial skills including environmental adaptation, habits, and routines and behaviors.   IMPAIRMENTS: are limiting patient from ADLs, IADLs, rest and sleep, and social participation.   COMORBIDITIES: may have co-morbidities  that affects occupational performance. Patient will benefit from skilled OT to address above impairments and improve overall function.  MODIFICATION OR ASSISTANCE TO COMPLETE EVALUATION: Min-Moderate modification of tasks or assist with assess necessary to complete an evaluation.  OT OCCUPATIONAL PROFILE AND HISTORY: Detailed assessment: Review of records and additional review of physical, cognitive, psychosocial history related to current functional performance.  CLINICAL DECISION MAKING: Moderate - several treatment options, min-mod task modification necessary  REHAB POTENTIAL: Good  EVALUATION COMPLEXITY: Moderate   PLAN:  OT FREQUENCY: Biweekly  OT DURATION: 8 wks  PLANNED INTERVENTIONS: 97168 OT Re-evaluation, 97535 self care/ADL training, 95188 therapeutic exercise,  97530 therapeutic activity, 97112 neuromuscular re-education, 97140 manual therapy, manual lymph drainage,  passive range of motion, compression bandaging, coping strategies training, patient/family education, and DME and/or AE instructions  RECOMMENDED OTHER SERVICES: none currently  CONSULTED AND AGREED WITH PLAN OF CARE: Patient  Ihor Makua, OTR/L, CLT 10/04/2023, 4:23 PM

## 2023-10-08 ENCOUNTER — Telehealth: Payer: Self-pay | Admitting: Family Medicine

## 2023-10-08 NOTE — Telephone Encounter (Signed)
 Left message that provider is still out on leave and we need to reschedule 6/9 appt. If patient calls back ok to reschedule

## 2023-10-10 ENCOUNTER — Telehealth: Payer: Self-pay

## 2023-10-10 NOTE — Telephone Encounter (Unsigned)
 Copied from CRM [id number]. Topic: Clinical - Prescription Issue >> Oct 10, 2023  4:00 PM Leslie Bautista wrote: Reason for CRM: Pt called in because [facility] ot [facility] Rebelius and was told they were on back order and they did not tell her when it would be ready. She did [facility] other [facility] and no one had any in stock.    Pt would like to know what she can do please follow up [phone]

## 2023-10-10 NOTE — Telephone Encounter (Signed)
 Copied from CRM [id number]. Topic: Clinical - Prescription Issue >> Oct 10, 2023  4:00 PM Leslie Bautista wrote: Reason for CRM: Pt called in because she [facility] Rebelius and was told they were on back order and they did not tell her when it would be ready. She did [facility] other [facility] and no one had any in stock.    Pt would like to know what she can do please follow up [phone]

## 2023-10-12 NOTE — Telephone Encounter (Signed)
 [id number] for E2C2 to give patient provider's message.

## 2023-10-12 NOTE — Telephone Encounter (Signed)
 There are not  any similar alternative medications. She needs to check with other pharmacies and find one that has 7mg  rybelsus . Recommend she try [facility], [facility] or [facility]. One of them should have it.

## 2023-10-16 NOTE — Telephone Encounter (Signed)
 Called patient and reports that [facility] got a shipment in and patient picked up medication.

## 2023-10-29 ENCOUNTER — Ambulatory Visit: Payer: 59 | Admitting: Family Medicine

## 2023-11-15 ENCOUNTER — Encounter: Payer: Self-pay | Admitting: Family Medicine

## 2023-11-15 ENCOUNTER — Ambulatory Visit (INDEPENDENT_AMBULATORY_CARE_PROVIDER_SITE_OTHER): Admitting: Family Medicine

## 2023-11-15 VITALS — BP 106/53 | HR 63 | Ht 63.5 in | Wt 270.8 lb

## 2023-11-15 DIAGNOSIS — G629 Polyneuropathy, unspecified: Secondary | ICD-10-CM | POA: Diagnosis not present

## 2023-11-15 DIAGNOSIS — E1122 Type 2 diabetes mellitus with diabetic chronic kidney disease: Secondary | ICD-10-CM

## 2023-11-15 DIAGNOSIS — M25551 Pain in right hip: Secondary | ICD-10-CM

## 2023-11-15 DIAGNOSIS — G4733 Obstructive sleep apnea (adult) (pediatric): Secondary | ICD-10-CM

## 2023-11-15 DIAGNOSIS — I152 Hypertension secondary to endocrine disorders: Secondary | ICD-10-CM

## 2023-11-15 DIAGNOSIS — G5603 Carpal tunnel syndrome, bilateral upper limbs: Secondary | ICD-10-CM | POA: Diagnosis not present

## 2023-11-15 DIAGNOSIS — E1159 Type 2 diabetes mellitus with other circulatory complications: Secondary | ICD-10-CM

## 2023-11-15 DIAGNOSIS — N1831 Chronic kidney disease, stage 3a: Secondary | ICD-10-CM

## 2023-11-15 DIAGNOSIS — Z7984 Long term (current) use of oral hypoglycemic drugs: Secondary | ICD-10-CM

## 2023-11-15 MED ORDER — GABAPENTIN 300 MG PO CAPS: ORAL_CAPSULE | ORAL | 2 refills | Status: AC

## 2023-11-15 NOTE — Progress Notes (Signed)
 Established Patient Office Visit  Subjective   Patient ID: Leslie Bautista, female    DOB: 05-17-1961  Age: 63 y.o. MRN: [id number]  Chief Complaint  Patient presents with   Medical Management of Chronic Issues   Hypertension    Patient reports taking medication as prescribed and monitoring at home on occasions. She reports SOB when walking for a while and becoming tired easy but no other symptoms. She does not smoke   Diabetes    She reports she monitors at home with reading never under 100 and ranging from 110 mg/dL and 884 mg/dL. She reports eye exam scheduled July 1st. She reports an increase in appetite and no other symptoms   Hip Pain    Right hip pain X 2-3 weeks when walking but she will sit down and relax if she begins to feel the pain. Describes as a throbbing pain and does not radiate.   Care Management    Pneumococcal Vaccine declined     Discussed the use of AI scribe software for clinical note transcription with the patient, who gave verbal consent to proceed.  History of Present Illness Leslie Bautista is a 63 year old female who presents for follow-up of her medical conditions.  She is recovering well from her recent osteophyte removal procedure. Swelling is subsiding, and she can swallow and speak without significant issues. She is not currently undergoing speech therapy. She is managed by ENT.  Her hypertension management includes a recent medication change to losartan  with hydrochlorothiazide. She monitors her blood pressure at home. Denies any vision changes, headaches, lightheadedness, dizziness, or syncope.  For diabetes management, she is on Rybelsus  and reports no side effects. Her A1c was checked today, and she has lost 10 pounds since her last visit. Home blood glucose readings are 1130s-150s fasting.  She has completed breast cancer treatment and is on a five-year medication, anastrozole, regimen that started in October. A follow-up with her oncologist is  scheduled for July. She experiences neuropathy in her hands and feet, worsened during chemotherapy, and uses heat for relief. She has a history of radiation therapy.  She experiences right hip pain with walking and sometimes while sitting, alleviated by sitting down. No trauma or injury to the hip and no recent imaging. She takes meloxicam  twice daily for pain management.  She reports fatigue and low energy levels, attributing it to poor sleep. She sleeps 3-4 hours per night, often waking to use the bathroom and having difficulty falling back asleep. She has a history of snoring and owns a CPAP machine, which she does not currently use.  She has a history of carpal tunnel syndrome and pinched nerves in both elbows, for which she wears braces at night. She has not undergone physical therapy for these issues and is interested.      11/15/2023   11:26 AM 09/25/2023    8:40 AM 08/01/2023    8:20 AM  Depression screen PHQ 2/9  Decreased Interest 0 0 0  Down, Depressed, Hopeless 2 0 0  PHQ - 2 Score 2 0 0  Altered sleeping 3 1 0  Tired, decreased energy 3 1 3   Change in appetite 2 2 0  Feeling bad or failure about yourself  0 0 0  Trouble concentrating 0 0 0  Moving slowly or fidgety/restless 0 0 0  Suicidal thoughts 0 0 0  PHQ-9 Score 10 4 3   Difficult doing work/chores Not difficult at all Not difficult at all Somewhat difficult  11/15/2023   11:27 AM 09/25/2023    8:41 AM 06/29/2023    9:42 AM  GAD 7 : Generalized Anxiety Score  Nervous, Anxious, on Edge 0 0 0  Control/stop worrying 0 0 0  Worry too much - different things 0 0 0  Trouble relaxing 0 0 1  Restless 0 0 1  Easily annoyed or irritable 0 0 0  Afraid - awful might happen 0 0 0  Total GAD 7 Score 0 0 2  Anxiety Difficulty Not difficult at all Not difficult at all Not difficult at all     Review of Systems  All other systems reviewed and are negative.   Negative unless indicated in HPI   Objective:     BP (!)  106/53 (BP Location: Right Arm, Patient Position: Sitting, Cuff Size: Large)   Pulse 63   Ht 5' 3.5 (1.613 m)   Wt 270 lb 12.8 oz (122.8 kg)   LMP 10/15/2019 Comment: Patient states there is no way she is pregnant.   SpO2 100%   BMI 47.22 kg/m    Physical Exam Constitutional:      General: She is not in acute distress.    Appearance: Normal appearance. She is obese. She is not toxic-appearing or diaphoretic.  HENT:     Head: Normocephalic.     Right Ear: Tympanic membrane, ear canal and external ear normal.     Left Ear: Tympanic membrane, ear canal and external ear normal.     Nose: Nose normal.     Mouth/Throat:     Mouth: Mucous membranes are moist.     Pharynx: Oropharynx is clear.   Eyes:     Extraocular Movements: Extraocular movements intact.     Pupils: Pupils are equal, round, and reactive to light.   Neck:     Vascular: No carotid bruit.     Comments: Healing incision site from osteophyte removal procedure Cardiovascular:     Rate and Rhythm: Normal rate and regular rhythm.     Pulses: Normal pulses.          Dorsalis pedis pulses are 2+ on the right side and 2+ on the left side.       Posterior tibial pulses are 2+ on the right side and 2+ on the left side.     Heart sounds: Normal heart sounds. No murmur heard.    No friction rub. No gallop.  Pulmonary:     Effort: No respiratory distress.     Breath sounds: No stridor. No wheezing, rhonchi or rales.  Chest:     Chest wall: No tenderness.   Musculoskeletal:     Cervical back: Normal range of motion and neck supple.     Right lower leg: No edema.     Left lower leg: No edema.     Right foot: Normal range of motion. No deformity, bunion, Charcot foot, foot drop or prominent metatarsal heads.     Left foot: Normal range of motion. No deformity, bunion, Charcot foot, foot drop or prominent metatarsal heads.  Feet:     Right foot:     Protective Sensation: 10 sites tested.  9 sites sensed.     Skin  integrity: Skin integrity normal.     Toenail Condition: Right toenails are abnormally thick.     Left foot:     Protective Sensation: 10 sites tested.  9 sites sensed.     Skin integrity: Skin integrity normal.     Toenail  Condition: Left toenails are abnormally thick.  Lymphadenopathy:     Cervical: No cervical adenopathy.   Skin:    General: Skin is warm and dry.     Capillary Refill: Capillary refill takes less than 2 seconds.   Neurological:     General: No focal deficit present.     Mental Status: She is alert and oriented to person, place, and time. Mental status is at baseline.     Cranial Nerves: No cranial nerve deficit.     Motor: Weakness present.     Comments: Weaker grip strength d/t neuropathy, full strength arms 5/5  Psychiatric:        Mood and Affect: Mood normal.        Behavior: Behavior normal.        Thought Content: Thought content normal.        Judgment: Judgment normal.     No results found for any visits on 11/15/23.    The 10-year ASCVD risk score (Arnett DK, et al., 2019) is: 7.3%    Assessment & Plan:  Type 2 diabetes mellitus with stage 3a chronic kidney disease, without long-term current use of insulin (HCC) -     Microalbumin / creatinine urine ratio -     Comprehensive metabolic panel with GFR -     Hemoglobin A1c  Right hip pain -     DG Arthro Hip Right; Future -     Ambulatory referral to Physical Therapy  Carpal tunnel syndrome, bilateral -     Ambulatory referral to Physical Therapy  Neuropathy -     Gabapentin ; Take one 300mg  capsule in the morning and at night take one 300 mg tablet plus 600 tablet.  Dispense: 180 capsule; Refill: 2  Hypertension associated with diabetes (HCC)  OSA (obstructive sleep apnea)     Assessment and Plan Assessment & Plan Post-operative recovery from osteophyte removal Recovering well with decreasing swelling. No speech therapy needed. - has f/u with ENT virtual appointment at the end of  July or early August.  Hip Pain Right-sided hip pain with walking and sometimes sitting.  Radiation history may contribute to bone issues. DXA scan in October 2024 normal - repeat to be in Fall 2026 - Order x-ray of the hip to rule out bone malformations due to radiation. - Recommend heat and ice, stretching, encourage weight mgmt. Continue prn meloxiam  Breast Cancer Completed treatment and on a five-year anastrozole regimen since October 2024. 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 - Follow up with oncologist in July. - Consider referral to physical therapy for neuropathy management.  Carpal Tunnel Syndrome and Pinched Nerve/ Neuropathy Continue gabapentin  Per patient Pinched nerve in both elbows and carpal tunnel syndrome in both hands. Wears braces at night Recommend use of \ a frozen water bottle rolling on wrist for relief. Weaker grip strength - Refer to physical therapy for carpal tunnel syndrome and pinched nerve management.  Hypertension On losartan  and hydrochlorothiazide 50/12.5.  GOAL<130/80 Today 106/53 Denies syncope, lightheadedness or dizziness. - Monitor blood pressure at home. - Consider reducing medication dosage if blood pressure remains at todays numbers. - Limit additive salts, increase purposeful movement as tolerated  Type 2 Diabetes Mellitus On Rybelsus  7mg  po dailywith no side effects.  A1c previously 6.5%.  Lost 10 pounds since last visit. Fasting blood glucose 130-150 mg/dL, target 80 to 869 mg/dL. - foot exam done today, greater toe bilaterally decrease sensation - wear proper footwaer, be ware of skin  checks, risk for wounds and falls. - be aware of stairs, rugs, carpets, cords.steps - Eye screening scheduled in July 2025 - Check A1c today. - Continue Rybelsus . - on ARB - Monitor fasting blood glucose levels. - Attend diabetes management class on July 10th.  Obstructive Sleep Apnea Has CPAP machine but not using it.   Reports poor sleep quality, frequent waking, and fatigue. - Encourage and highly advise use of CPAP machine. - Monitor sleep quality and fatigue.  General Health Maintenance Advised to drink at least 80 ounces of water daily and maintain a balanced diet with frequent small meals to manage blood glucose levels. - Encourage hydration with at least 80 ounces of water daily. - Maintain a balanced diet with frequent small meals.   Return in about 3 months (around 02/15/2024) for DM adn HTN.   I, Curtis DELENA Boom, FNP, have reviewed all documentation for this visit. The documentation on 11/15/23 for the exam, diagnosis, procedures, and orders are all accurate and complete.   Curtis DELENA Boom, FNP

## 2023-11-16 ENCOUNTER — Other Ambulatory Visit: Payer: Self-pay | Admitting: Family Medicine

## 2023-11-16 ENCOUNTER — Ambulatory Visit
Admission: RE | Admit: 2023-11-16 | Discharge: 2023-11-16 | Disposition: A | Discharge status: Home / Self Care | Attending: Family Medicine | Admitting: Family Medicine

## 2023-11-16 ENCOUNTER — Ambulatory Visit
Admission: RE | Admit: 2023-11-16 | Discharge: 2023-11-16 | Disposition: A | Discharge status: Home / Self Care | Source: Ambulatory Visit | Attending: Family Medicine | Admitting: Family Medicine

## 2023-11-16 DIAGNOSIS — M25551 Pain in right hip: Secondary | ICD-10-CM

## 2023-11-19 LAB — MICROALBUMIN / CREATININE URINE RATIO

## 2023-11-20 ENCOUNTER — Ambulatory Visit: Payer: Self-pay | Admitting: Family Medicine

## 2023-11-20 LAB — COMPREHENSIVE METABOLIC PANEL WITH GFR
ALT: 9 IU/L (ref 0–32)
AST: 16 IU/L (ref 0–40)
Albumin: 3.8 g/dL — AB (ref 3.9–4.9)
Alkaline Phosphatase: 119 IU/L (ref 44–121)
BUN/Creatinine Ratio: 13 (ref 12–28)
BUN: 15 mg/dL (ref 8–27)
Bilirubin Total: 0.3 mg/dL (ref 0.0–1.2)
CO2: 23 mmol/L (ref 20–29)
Calcium: 9.3 mg/dL (ref 8.7–10.3)
Chloride: 101 mmol/L (ref 96–106)
Creatinine, Ser: 1.2 mg/dL — AB (ref 0.57–1.00)
Globulin, Total: 3.1 g/dL (ref 1.5–4.5)
Glucose: 93 mg/dL (ref 70–99)
Potassium: 3.9 mmol/L (ref 3.5–5.2)
Sodium: 142 mmol/L (ref 134–144)
Total Protein: 6.9 g/dL (ref 6.0–8.5)
eGFR: 51 mL/min/{1.73_m2} — AB (ref >59)

## 2023-11-20 LAB — HEMOGLOBIN A1C
Est. average glucose Bld gHb Est-mCnc: 120 mg/dL
Hgb A1c MFr Bld: 5.8 % — ABNORMAL HIGH (ref 4.8–5.6)

## 2023-11-20 LAB — OPHTHALMOLOGY REPORT-SCANNED

## 2023-11-20 LAB — MICROALBUMIN / CREATININE URINE RATIO

## 2023-11-22 ENCOUNTER — Other Ambulatory Visit: Payer: Self-pay | Admitting: Family Medicine

## 2023-11-22 ENCOUNTER — Telehealth: Payer: Self-pay | Admitting: Family Medicine

## 2023-11-22 DIAGNOSIS — E1165 Type 2 diabetes mellitus with hyperglycemia: Secondary | ICD-10-CM

## 2023-11-22 MED ORDER — TRAZODONE HCL 50 MG PO TABS: 50.0000 mg | ORAL_TABLET | Freq: Every day | ORAL | 1 refills | Status: AC

## 2023-11-22 NOTE — Telephone Encounter (Signed)
 Copied from CRM [id number]. Topic: Clinical - Medication Refill >> Nov 22, 2023  9:58 AM Tiffini S wrote: Medication: traZODone (DESYREL) 100 MG tablet Has the patient contacted their pharmacy? Yes (Agent: If no, request that the patient contact the pharmacy for the refill. If patient does not wish to contact the pharmacy document the reason why and proceed with request.) (Agent: If yes, when and what did the pharmacy advise?)  This is the patient's preferred pharmacy:  [facility] [address] [place] - [address] [place] [address] Phone: [phone] Fax: [phone]  Is this the correct pharmacy for this prescription? Yes If no, delete pharmacy and type the correct one.   Has the prescription been filled recently? Yes  Is the patient out of the medication? Yes, have one tablet for tonight   Has the patient been seen for an appointment in the last year OR does the patient have an upcoming appointment? Yes  Can we respond through MyChart? No, patient is asking for phone call at [phone]  Agent: Please be advised that Rx refills may take up to 3 business days. We ask that you follow-up with your pharmacy.

## 2023-11-22 NOTE — Telephone Encounter (Signed)
 Trazodone LOV:11/15/2023 NOV:02/15/24 LR: 04/30/2014 qty: ?  Rybelsus : LR: 08/03/23 Is she on 3mg  or 7mg 

## 2023-11-22 NOTE — Telephone Encounter (Signed)
 Converted into refill request.

## 2023-11-25 ENCOUNTER — Ambulatory Visit: Payer: Self-pay | Admitting: Family Medicine

## 2023-11-29 ENCOUNTER — Encounter: Payer: Self-pay | Admitting: Dietician

## 2023-11-29 ENCOUNTER — Encounter: Attending: Family Medicine | Admitting: Dietician

## 2023-11-29 DIAGNOSIS — Z713 Dietary counseling and surveillance: Secondary | ICD-10-CM | POA: Insufficient documentation

## 2023-11-29 DIAGNOSIS — E119 Type 2 diabetes mellitus without complications: Secondary | ICD-10-CM | POA: Insufficient documentation

## 2023-11-29 NOTE — Progress Notes (Signed)
 Patient was seen on 11/29/2023 for the first of a series of three diabetes self-management courses at the [facility].  Patient Education Plan per assessed needs and concerns is to attend three course education program for Diabetes Self Management Education.  A1C was 5.8% on 11/15/2023, 6.5% on 06/29/2023  The following learning objectives were met by the patient during this class: Describe diabetes, types of diabetes and pathophysiology State some common risk factors for diabetes Defines the role of glucose and insulin Describe the relationship between diabetes and cardiovascular and other risks State the members of the Healthcare Team States the rationale for glucose monitoring and when to test State their individual Target Range State the importance of logging glucose readings and how to interpret the readings Identifies A1C target Explain the correlation between A1c and eAG values State symptoms and treatment of high blood glucose and low blood glucose Explain proper technique for glucose testing and identify proper sharps disposal  Handouts given during class include: How to Thrive:  A Guide for Your Journey with Diabetes by the ADA Meal Plan Card and carbohydrate content list Dietary intake form Low Sodium Flavoring Tips Types of Fats Dining Out Label reading Snack list The diabetes portion plate Diabetes Resources A1c to eAG Conversion Chart Blood Glucose Log Diabetes Recommended Care Schedule Support Group Diabetes Success Plan Core Class Satisfaction Survey   Follow-Up Plan: Attend core 2

## 2023-12-03 ENCOUNTER — Telehealth: Payer: Self-pay | Admitting: Family Medicine

## 2023-12-03 NOTE — Telephone Encounter (Signed)
Patient advised of lab results

## 2023-12-03 NOTE — Telephone Encounter (Signed)
 Copied from CRM [id number]. 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 She states that her right hip is still giving her trouble with pain for walking and standing- area is where the hip and back connect. Patient is asking for explanation about lab results at [phone].

## 2023-12-06 ENCOUNTER — Ambulatory Visit

## 2023-12-13 ENCOUNTER — Ambulatory Visit

## 2024-01-07 ENCOUNTER — Encounter: Payer: Self-pay | Admitting: Occupational Therapy

## 2024-01-07 ENCOUNTER — Ambulatory Visit: Attending: Orthopedic Surgery | Admitting: Occupational Therapy

## 2024-01-07 DIAGNOSIS — M79642 Pain in left hand: Secondary | ICD-10-CM | POA: Insufficient documentation

## 2024-01-07 DIAGNOSIS — M25641 Stiffness of right hand, not elsewhere classified: Secondary | ICD-10-CM | POA: Insufficient documentation

## 2024-01-07 DIAGNOSIS — R208 Other disturbances of skin sensation: Secondary | ICD-10-CM | POA: Diagnosis present

## 2024-01-07 DIAGNOSIS — M6281 Muscle weakness (generalized): Secondary | ICD-10-CM | POA: Diagnosis present

## 2024-01-07 DIAGNOSIS — M79641 Pain in right hand: Secondary | ICD-10-CM | POA: Diagnosis present

## 2024-01-07 DIAGNOSIS — M25642 Stiffness of left hand, not elsewhere classified: Secondary | ICD-10-CM | POA: Insufficient documentation

## 2024-01-07 NOTE — Therapy (Signed)
 OUTPATIENT OCCUPATIONAL THERAPY ORTHO EVALUATION  Patient Name: Leslie Bautista MRN: [id number] DOB:1961-04-22, 63 y.o., female Today's Date: 01/07/2024  PCP: [name] NP REFERRING PROVIDER: Dr Kathlynn  END OF SESSION:  OT End of Session - 01/07/24 1813     Visit Number 1    Number of Visits 12    Date for OT Re-Evaluation 02/18/24    OT Start Time 1602    OT Stop Time 1651    OT Time Calculation (min) 49 min    Activity Tolerance Patient tolerated treatment well    Behavior During Therapy WFL for tasks assessed/performed          Past Medical History:  Diagnosis Date   Allergy    Cancer (HCC)    breast cancer on left side   Cataract    Depression    Diabetes mellitus without complication (HCC)    Hypertension    Morbid obesity with BMI of 50.0-59.9, adult (HCC)    Obstructive sleep apnea    Osteoarthritis of both knees    Pre-diabetes    Sleep apnea    Past Surgical History:  Procedure Laterality Date   ANKLE SURGERY Right    ligament repair   BREAST SURGERY     CATARACT EXTRACTION, BILATERAL Bilateral 2022   COLONOSCOPY  2015   COLONOSCOPY WITH PROPOFOL  N/A 07/26/2023   Procedure: COLONOSCOPY WITH PROPOFOL ;  Surgeon: Unk Corinn Skiff, MD;  Location: [facility];  Service: Gastroenterology;  Laterality: N/A;   EYE SURGERY     JOINT REPLACEMENT Right    Knee   POLYPECTOMY  07/26/2023   Procedure: POLYPECTOMY;  Surgeon: Unk Corinn Skiff, MD;  Location: [facility];  Service: Gastroenterology;;   TONSILLECTOMY Bilateral 06/14/2021   Procedure: TONSILLECTOMY;  Surgeon: Milissa Hamming, MD;  Location: [facility];  Service: ENT;  Laterality: Bilateral;   TOTAL KNEE ARTHROPLASTY Right    TUBAL LIGATION     Patient Active Problem List   Diagnosis Date Noted   Diabetes mellitus without complication (HCC) 09/25/2023   Hx of colonic polyps 07/26/2023   Polyp of ascending colon 07/26/2023   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   Tenesmus (rectal) 06/29/2023    Chronic pain of both lower extremities 06/29/2023   Acute cough 06/29/2023   Need for hepatitis C screening test 06/29/2023   Screening for deficiency anemia 06/29/2023   Encounter to establish care with new doctor 06/29/2023   Neuropathy 06/29/2023   Chemotherapy-induced neutropenia (HCC) 12/05/2022   Thrombocytopenia (HCC) 12/05/2022   Lower urinary tract infectious disease 12/03/2022   Myocardial injury 12/03/2022   Obesity, Class III, BMI 40-49.9 (morbid obesity) 12/03/2022   Breast cancer (HCC) 12/03/2022   HLD (hyperlipidemia) 12/03/2022   OSA on CPAP 12/03/2022   Hypersensitivity reaction 10/24/2022   Invasive ductal carcinoma of breast, left (HCC) 09/26/2022   Endometrial thickening on ultrasound 07/05/2021   CKD stage G3a/A1, 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) 12/10/2019   Dyspareunia due to medical condition in female 10/24/2019   Postmenopause bleeding 03/08/2018   Pre-diabetes 11/22/2015   Depression 06/03/2014   Class 3 severe obesity without serious comorbidity with body mass index (BMI) of 50.0 to 59.9 in adult 01/07/2014   HTN (hypertension) 10/25/2011   Primary localized osteoarthrosis, lower leg 12/19/2007    ONSET DATE: 3-6 months  REFERRING DIAG: Bilateral hand stiffness and pain mostly 3rd through 5th digits  THERAPY DIAG:  Stiffness of left hand, not elsewhere classified  Stiffness of right hand, not elsewhere classified  Pain in left hand  Pain in right hand  Muscle weakness (generalized)  Other disturbances of skin sensation  Rationale for Evaluation and Treatment: Rehabilitation  SUBJECTIVE:   SUBJECTIVE STATEMENT: My fingers is so stiff.  I cannot make a fist.  And they hurts over the knuckles into the palm into my forearm.  I have no strength.  Pain and difficulty with laundry and cooking and bathing and dressing driving. Pt accompanied by: self  PERTINENT HISTORY: 01/03/24 Dr Kathlynn visit :  DIAGNOSTIC Upper extremity  nerve conduction study: Chronic mild sensory polyneuropathy (11/07/2023) Lower extremity nerve conduction study: Chronic severe sensorimotor polyneuropathy (11/06/2023)    Assessment/Plan:   Assessment & Plan Bilateral hand stiffness and weakness  Chronic bilateral hand stiffness and weakness with difficulty making a fist. Negative for carpal tunnel syndrome and trigger finger. Suspected inflammatory problem affecting tendons, not related to bones or nerves. Possible rheumatologic condition considered, though no joint swelling observed. Diabetes is well-controlled with an A1c of 5.8, and impaired kidney function precludes anti-inflammatory medications. Refer to hand therapy at [facility] outpatient facility on [facility] for therapy twice a week for a month. Instruct to contact [name], the hand therapist, directly for specialized care. Advise to call back if no improvement after a month for re-evaluation. Plan for potential follow-up with a hand specialist, Dr. Ezra, if symptoms persist.  Mild bilateral hand osteoarthritis  Mild osteoarthritis in hand joints, not severe enough to explain current stiffness and weakness. X-rays show minimal arthritis, particularly in the thumb. Diagnoses and all orders for this visit:  Stiffness of joints of both hands - Ambulatory Referral to Occupational Therapy   PRECAUTIONS: 10 pound limit because of cervical surgery about 3 months ago   WEIGHT BEARING RESTRICTIONS: No  PAIN:  Are you having pain?  9/10 over left metacarpals and webspace and right metacarpals into the palm and bilateral forearms  FALLS: Has patient fallen in last 6 months? No  LIVING ENVIRONMENT: Lives with: lives with their family   PLOF: Patient on disability.  Patient had breast cancer treatment and then had about 3 months ago cervical surgery.  Patient perform her own ADLs and helps at home with some cleaning some cooking and laundry.  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  PATIENT GOALS: I want the stiffness and the pain better so I can use my hands doing laundry, cooking, bathing, driving.    OBJECTIVE:  Note: Objective measures were completed at Evaluation unless otherwise noted.  HAND DOMINANCE: Left  ADLs: Unable to grasp to cook or laundry or cleaning cannot make a composite fist with increased pain and stiffness in the palm, fingers and forearm-difficulty with bathing and dressing and driving to  FUNCTIONAL OUTCOME MEASURES: To do next session UPPER EXTREMITY ROM:     Active ROM Right eval Left eval  Shoulder flexion    Shoulder abduction    Shoulder adduction    Shoulder extension    Shoulder internal rotation    Shoulder external rotation    Elbow flexion    Elbow extension    Wrist flexion    Wrist extension 60 55  Wrist ulnar deviation    Wrist radial deviation    Wrist pronation    Wrist supination    (Blank rows = not tested)  Active ROM Right eval Left eval  Thumb MCP (0-60)    Thumb IP (0-80)    Thumb Radial  abd/add (0-55)     Thumb Palmar abd/add (0-45)     Thumb Opposition to Small Finger     Index MCP (0-90) 65 75   Index PIP (0-100) 95  90  Index DIP (0-70)      Long MCP (0-90) 70  80   Long PIP (0-100)  90 90   Long DIP (0-70)      Ring MCP (0-90) 75  90   Ring PIP (0-100)  90  90  Ring DIP (0-70)      Little MCP (0-90)  80  90  Little PIP (0-100)  90 85   Little DIP (0-70)      (Blank rows = not tested) Patient with increased pain in the left more than the right with attempts of composite fist.  HAND FUNCTION: Grip strength: Right: 26 lbs; Left: 24 lbs, Lateral pinch: Right: 12 lbs, Left: 6 lbs, and 3 point pinch: Right: 9 lbs, Left: 5 lbs  COORDINATION: Decreased because of increased stiffness in digits unable to make composite fist.  And pain in digit with 3-point pinch with 2-point pinch  SENSATION: Since chemotherapy patient reports no  neuropathy in all digits  EDEMA: Patient denies swelling  COGNITION: Overall cognitive status: Within functional limits for tasks assessed     TREATMENT DATE: 01/07/24                                                                                                                            Modalities: Fluidotherapy:  Time: 8 minutes Location: Bilateral wrist and hands Decrease stiffness and pain prior to review of home exercises.  Pain improved from a 9/10 to 2-5/10 in bilateral hands.  With increased motion  Reviewed with patient 3 times oh day home exercises with a moist heat prior to Soft tissue mobilization over right foam roller for palms as well as volar forearms 20 reps each Followed by gentle focusing on motion block to tendon glides with composite fist to right roller pain-free.  10 reps   Patient to enlarge her grips on her utensils as well as her toothbrush.  As well as not over grip steering wheel or when doing household activities. Patient also to use larger joints like her palm and forearms to carry and lift objects.      PATIENT EDUCATION: Education details: findings of eval and HEP  Person educated: Patient Education method: Explanation, Demonstration, Tactile cues, Verbal cues, and Handouts Education comprehension: verbalized understanding, returned demonstration, verbal cues required, and needs further education    GOALS: Goals reviewed with patient? Yes    LONG TERM GOALS: Target date: 6 weeks  Patient to be independent in home program to decrease pain with home exercises to less than 5/10 to be able to increase motion Baseline: Pain 9/10 with decreased MCP flexion on the right 65-80 and PIP is 90-95.  Left MC 75-90 and PIP is 90 to 85 degrees. Goal status: INITIAL  2.  Bilateral digits flexion improved for patient to be able to touch palm with pain less than 2/10 with cutting food, brushing her teeth and driving. Baseline: Patient limited in flexion  of all digits with increased pain over the metacarpals as well as the volar palm and forearm.  With increased pain to 9/10 with use Goal status: INITIAL  3.  Patient's pain in bilateral metacarpals as well as palm of forearm decreased to 2/10 to improve active range of motion of bilateral hands and wrist within functional limits Baseline: Pain 9/10 with decreased motion and decreased strength Goal status: INITIAL  4.  Bilateral grip and prehension strength improved to within normal range for her age to be able to improve her functional use in cooking and laundry symptom free Baseline: Right grip 26 pounds and left 24 pounds with pain.  Lateral pinch 12 pounds on the right and left 6 pounds and 3 point pinch 9 on the right left 5 pounds.  With increased pain with functional use Goal status: INITIAL  5.   PRWHE score for pain and function increased with more than 15 points Baseline:  PRWHE score to be done next session  Goal status: INITIAL  ASSESSMENT:  CLINICAL IMPRESSION: Patient seen today for occupational therapy evaluation for bilateral hand and finger stiffness.  With increased pain.  Left worse than the right.  Patient is left-handed.  Patient had breast cancer with treatment of chemotherapy and report some neuropathy in all digits.  Patient also had cervical surgery about 3 months ago.  Patient with some arthritic changes to bilateral hands.  Patient present to OT evaluation with decreased AROM in all digits with 3rd through 5th the worst.  But patient also limited in second digit avoiding composite fist but using more dexterity and 3-point pinch.  Patient's left hand pain worse than the right but motion wise right worse than the left.  See flowsheets above for motion.  Patient with increased pain over the metacarpals into the palm as well as the volar forearm.  Thumb range of motion within normal limits and denies any pain.  Patient also with decreased grip and prehension strength with  increased pain.  Patient limited in functional use of bilateral hands and ADLs and IADLs.  Patient can benefit from skilled OT services to decrease pain and stiffness and increased motion and strength to return to prior level of function.  PERFORMANCE DEFICITS: in functional skills including ADLs, IADLs, ROM, strength, pain, flexibility, decreased knowledge of use of DME, and UE functional use,   and psychosocial skills including environmental adaptation and routines and behaviors.   IMPAIRMENTS: are limiting patient from ADLs, IADLs, rest and sleep, play, leisure, and social participation.   COMORBIDITIES: has no other co-morbidities that affects occupational performance. Patient will benefit from skilled OT to address above impairments and improve overall function.  MODIFICATION OR ASSISTANCE TO COMPLETE EVALUATION: No modification of tasks or assist necessary to complete an evaluation.  OT OCCUPATIONAL PROFILE AND HISTORY: Problem focused assessment: Including review of records relating to presenting problem.  CLINICAL DECISION MAKING: LOW - limited treatment options, no task modification necessary  REHAB POTENTIAL: Good for goals  EVALUATION COMPLEXITY: Low      PLAN:  OT FREQUENCY: 1-2x/week  OT DURATION: 6 wks  PLANNED INTERVENTIONS: 97168 OT Re-evaluation, 97535 self care/ADL training, 02889 therapeutic exercise, 97530 therapeutic activity, 97112 neuromuscular re-education, 97140 manual therapy, 97035 ultrasound, 97018 paraffin, 02960 fluidotherapy, 97010 moist heat, 97760 Orthotic Initial, [id number] Orthotic/Prosthetic subsequent, patient/family education,  and DME and/or AE instructions    CONSULTED AND AGREED WITH PLAN OF CARE: Patient     Ancel Peters, OTR/L,CLT 01/07/2024, 6:17 PM

## 2024-01-14 ENCOUNTER — Ambulatory Visit: Admitting: Occupational Therapy

## 2024-01-14 DIAGNOSIS — M25641 Stiffness of right hand, not elsewhere classified: Secondary | ICD-10-CM

## 2024-01-14 DIAGNOSIS — M79642 Pain in left hand: Secondary | ICD-10-CM

## 2024-01-14 DIAGNOSIS — M79641 Pain in right hand: Secondary | ICD-10-CM

## 2024-01-14 DIAGNOSIS — R208 Other disturbances of skin sensation: Secondary | ICD-10-CM

## 2024-01-14 DIAGNOSIS — M25642 Stiffness of left hand, not elsewhere classified: Secondary | ICD-10-CM

## 2024-01-14 DIAGNOSIS — M6281 Muscle weakness (generalized): Secondary | ICD-10-CM

## 2024-01-14 NOTE — Therapy (Signed)
 OUTPATIENT OCCUPATIONAL THERAPY ORTHO TREATMENT  Patient Name: Leslie Bautista MRN: [id number] DOB:1961-02-25, 63 y.o., female Today's Date: 01/14/2024  PCP: [name] NP REFERRING PROVIDER: Dr Kathlynn  END OF SESSION:  OT End of Session - 01/14/24 1521     Visit Number 2    Number of Visits 12    Date for OT Re-Evaluation 02/18/24    OT Start Time 1520    OT Stop Time 1613    OT Time Calculation (min) 53 min    Activity Tolerance Patient tolerated treatment well    Behavior During Therapy WFL for tasks assessed/performed          Past Medical History:  Diagnosis Date   Allergy    Cancer (HCC)    breast cancer on left side   Cataract    Depression    Diabetes mellitus without complication (HCC)    Hypertension    Morbid obesity with BMI of 50.0-59.9, adult (HCC)    Obstructive sleep apnea    Osteoarthritis of both knees    Pre-diabetes    Sleep apnea    Past Surgical History:  Procedure Laterality Date   ANKLE SURGERY Right    ligament repair   BREAST SURGERY     CATARACT EXTRACTION, BILATERAL Bilateral 2022   COLONOSCOPY  2015   COLONOSCOPY WITH PROPOFOL  N/A 07/26/2023   Procedure: COLONOSCOPY WITH PROPOFOL ;  Surgeon: Unk Corinn Skiff, MD;  Location: [facility];  Service: Gastroenterology;  Laterality: N/A;   EYE SURGERY     JOINT REPLACEMENT Right    Knee   POLYPECTOMY  07/26/2023   Procedure: POLYPECTOMY;  Surgeon: Unk Corinn Skiff, MD;  Location: [facility];  Service: Gastroenterology;;   TONSILLECTOMY Bilateral 06/14/2021   Procedure: TONSILLECTOMY;  Surgeon: Milissa Hamming, MD;  Location: [facility];  Service: ENT;  Laterality: Bilateral;   TOTAL KNEE ARTHROPLASTY Right    TUBAL LIGATION     Patient Active Problem List   Diagnosis Date Noted   Diabetes mellitus without complication (HCC) 09/25/2023   Hx of colonic polyps 07/26/2023   Polyp of ascending colon 07/26/2023   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   Tenesmus (rectal) 06/29/2023    Chronic pain of both lower extremities 06/29/2023   Acute cough 06/29/2023   Need for hepatitis C screening test 06/29/2023   Screening for deficiency anemia 06/29/2023   Encounter to establish care with new doctor 06/29/2023   Neuropathy 06/29/2023   Chemotherapy-induced neutropenia (HCC) 12/05/2022   Thrombocytopenia (HCC) 12/05/2022   Lower urinary tract infectious disease 12/03/2022   Myocardial injury 12/03/2022   Obesity, Class III, BMI 40-49.9 (morbid obesity) 12/03/2022   Breast cancer (HCC) 12/03/2022   HLD (hyperlipidemia) 12/03/2022   OSA on CPAP 12/03/2022   Hypersensitivity reaction 10/24/2022   Invasive ductal carcinoma of breast, left (HCC) 09/26/2022   Endometrial thickening on ultrasound 07/05/2021   CKD stage G3a/A1, 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) 12/10/2019   Dyspareunia due to medical condition in female 10/24/2019   Postmenopause bleeding 03/08/2018   Pre-diabetes 11/22/2015   Depression 06/03/2014   Class 3 severe obesity without serious comorbidity with body mass index (BMI) of 50.0 to 59.9 in adult 01/07/2014   HTN (hypertension) 10/25/2011   Primary localized osteoarthrosis, lower leg 12/19/2007    ONSET DATE: 3-6 months  REFERRING DIAG: Bilateral hand stiffness and pain mostly 3rd through 5th digits  THERAPY DIAG:  Stiffness of left hand, not elsewhere classified  Stiffness of right hand, not elsewhere classified  Pain in left hand  Pain in right hand  Muscle weakness (generalized)  Other disturbances of skin sensation  Rationale for Evaluation and Treatment: Rehabilitation  SUBJECTIVE:   SUBJECTIVE STATEMENT:   My thumbs feels good and forearms but my L index finger the worse and middle knuckles on the L and middle and ring finger on the R - R hand better than the L - I done the exercises like you told me  Pt accompanied by: self  PERTINENT HISTORY: 01/03/24 Dr Kathlynn visit :  DIAGNOSTIC Upper extremity nerve  conduction study: Chronic mild sensory polyneuropathy (11/07/2023) Lower extremity nerve conduction study: Chronic severe sensorimotor polyneuropathy (11/06/2023)    Assessment/Plan:   Assessment & Plan Bilateral hand stiffness and weakness  Chronic bilateral hand stiffness and weakness with difficulty making a fist. Negative for carpal tunnel syndrome and trigger finger. Suspected inflammatory problem affecting tendons, not related to bones or nerves. Possible rheumatologic condition considered, though no joint swelling observed. Diabetes is well-controlled with an A1c of 5.8, and impaired kidney function precludes anti-inflammatory medications. Refer to hand therapy at [facility] outpatient facility on [facility] for therapy twice a week for a month. Instruct to contact [name], the hand therapist, directly for specialized care. Advise to call back if no improvement after a month for re-evaluation. Plan for potential follow-up with a hand specialist, Dr. Ezra, if symptoms persist.  Mild bilateral hand osteoarthritis  Mild osteoarthritis in hand joints, not severe enough to explain current stiffness and weakness. X-rays show minimal arthritis, particularly in the thumb. Diagnoses and all orders for this visit:  Stiffness of joints of both hands - Ambulatory Referral to Occupational Therapy   PRECAUTIONS: 10 pound limit because of cervical surgery about 3 months ago   WEIGHT BEARING RESTRICTIONS: No  PAIN:  Are you having pain? 4/10 in the R 4th and 3rd PIP and  9/10 over left 2nd  metacarpals  and 4/10  3rd thru 5th PIP's  FALLS: Has patient fallen in last 6 months? No  LIVING ENVIRONMENT: Lives with: lives with their family   PLOF: Patient on disability.  Patient had breast cancer treatment and then had about 3 months ago cervical surgery.  Patient perform her own ADLs and helps at home with some cleaning some cooking and laundry.  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  PATIENT GOALS: I want the stiffness and the pain better so I can use my hands doing laundry, cooking, bathing, driving.    OBJECTIVE:  Note: Objective measures were completed at Evaluation unless otherwise noted.  HAND DOMINANCE: Left  ADLs: Unable to grasp to cook or laundry or cleaning cannot make a composite fist with increased pain and stiffness in the palm, fingers and forearm-difficulty with bathing and dressing and driving to  FUNCTIONAL OUTCOME MEASURES: To do next session UPPER EXTREMITY ROM:     Active ROM Right eval Left eval  Shoulder flexion    Shoulder abduction    Shoulder adduction    Shoulder extension    Shoulder internal rotation    Shoulder external rotation    Elbow flexion    Elbow extension    Wrist flexion    Wrist extension 60 55  Wrist ulnar deviation    Wrist radial deviation    Wrist pronation    Wrist supination    (Blank rows = not tested)  Active ROM Right eval Left eval R/L 01/14/24  Thumb MCP (0-60)     Thumb IP (0-80)     Thumb Radial abd/add (0-55)      Thumb Palmar abd/add (0-45)      Thumb Opposition to Small Finger      Index MCP (0-90) 65 75  70/75  Index PIP (0-100) 95  90 100/95  Index DIP (0-70)       Long MCP (0-90) 70  80  85/90  Long PIP (0-100)  90 90  100/90  Long DIP (0-70)       Ring MCP (0-90) 75  90  85/90  Ring PIP (0-100)  90  90 100/90  Ring DIP (0-70)       Little MCP (0-90)  80  90 85/90  Little PIP (0-100)  90 85  95/90  Little DIP (0-70)       (Blank rows = not tested) Patient with increased pain in the left more than the right with attempts of composite fist.  HAND FUNCTION:  Grip strength: Right: 26 lbs; Left: 24 lbs, Lateral pinch: Right: 12 lbs, Left: 6 lbs, and 3 point pinch: Right: 9 lbs, Left: 5 lbs 01/14/24  Grip strength: Right: 35 lbs; Left: 24 lbs, Lateral pinch: Right: 12 lbs, Left: 12 lbs, and 3 point pinch: Right: 9 lbs, Left: 5  lbs  COORDINATION: Decreased because of increased stiffness in digits unable to make composite fist.  And pain in digit with 3-point pinch with 2-point pinch  SENSATION: Since chemotherapy patient reports no neuropathy in all digits  EDEMA: Patient denies swelling  COGNITION: Overall cognitive status: Within functional limits for tasks assessed     TREATMENT DATE: 01/14/24           Increase motion in all digits R more than L -and increase grip and prehension strength on R more than L - see flowsheet Cont stiffness in the L 3rd thru 5th PIP's and pain and tenderness and edema over L 2nd MC                                                                                                                        Modalities: Fluidotherapy:  Time: 8 minutes Location: L wrist and hand Decrease stiffness and pain- increase AROM in wrist and digits - pain decrease to 6/10   Paraffin to right hand 8 minutes to decrease stiffness and pain in 3rd and 4th PIP digits. Great improvement.  And pain and stiffness. Patient to do at home moist heat to the right hand but contrast to the left to decrease edema and pain in the left second metacarpal   patient continues to do 3 times a day home exercises prior Soft tissue mobilization over right out of palm pain-free for palms as well as volar forearms 20 reps each   Manual therapy: Done this date Graston tool #2 for sweeping over volar and dorsal forearm with wrist flexion extension.  As well as Graston tool #6 for brushing and sweeping  over volar digits and palm prior to digit flexion extension. Done dry light static cupping 5 cm size on volar forearm 2 times for 10 reps combination wrist flexion extension as well as dorsal forearm 2 cups with wrist flexion extension.  Repeating volar forearm in combination with 1 cup on the palm with digit extension combination with wrist flexion extension On left and right.  With great improvement in trigger point  as well as tight fascia    Followed by tendon glides with focus on metacarpal flexion active assisted if needed 10 reps Followed by blocked intrinsic a fist pain-free not to force the left 3rd through 5th 10 reps  with composite fist to palm on the right and left to red foam roller pain-free 10 reps reinforced Continue with gentle wrist flexion extension.  Patient to enlarge her grips on her utensils as well as her toothbrush.  As well as not over grip steering wheel or when doing household activities. Patient also to use larger joints like her palm and forearms to carry and lift objects.      PATIENT EDUCATION: Education details: findings of eval and HEP  Person educated: Patient Education method: Explanation, Demonstration, Tactile cues, Verbal cues, and Handouts Education comprehension: verbalized understanding, returned demonstration, verbal cues required, and needs further education    GOALS: Goals reviewed with patient? Yes    LONG TERM GOALS: Target date: 6 weeks  Patient to be independent in home program to decrease pain with home exercises to less than 5/10 to be able to increase motion Baseline: Pain 9/10 with decreased MCP flexion on the right 65-80 and PIP is 90-95.  Left MC 75-90 and PIP is 90 to 85 degrees. Goal status: INITIAL  2.  Bilateral digits flexion improved for patient to be able to touch palm with pain less than 2/10 with cutting food, brushing her teeth and driving. Baseline: Patient limited in flexion of all digits with increased pain over the metacarpals as well as the volar palm and forearm.  With increased pain to 9/10 with use Goal status: INITIAL  3.  Patient's pain in bilateral metacarpals as well as palm of forearm decreased to 2/10 to improve active range of motion of bilateral hands and wrist within functional limits Baseline: Pain 9/10 with decreased motion and decreased strength Goal status: INITIAL  4.  Bilateral grip and prehension  strength improved to within normal range for her age to be able to improve her functional use in cooking and laundry symptom free Baseline: Right grip 26 pounds and left 24 pounds with pain.  Lateral pinch 12 pounds on the right and left 6 pounds and 3 point pinch 9 on the right left 5 pounds.  With increased pain with functional use Goal status: INITIAL  5.   PRWHE score for pain and function increased with more than 15 points Baseline:  PRWHE score to be done next session  Goal status: INITIAL  ASSESSMENT:  CLINICAL IMPRESSION: Patient seen today for occupational therapy evaluation for bilateral hand and finger stiffness.  With increased pain.  Left worse than the right.  Patient is left-handed.  Patient had breast cancer with treatment of chemotherapy and report some neuropathy in all digits.  Patient also had cervical surgery about 3 months ago.  Patient with some arthritic changes to bilateral hands.  Patient present to OT evaluation with decreased AROM in all digits with 3rd through 5th the worst.  But patient also limited in second digit avoiding composite fist but  using more dexterity and 3-point pinch.  Patient's left hand pain worse than the right but motion wise right worse than the left.  See flowsheets above for motion.  Patient with increased pain over the metacarpals into the palm as well as the volar forearm.  Thumb range of motion within normal limits and denies any pain.  NOW patient arrived today with great improvement in active range of motion and pain with right more than the left.  Patient with decreased pain in the forearm as well as webspaces.  Reinforced with patient to focus on composite flexion to right foam roller on the left.  Right palm.  Plan to initiate iontophoresis with dexamethasone  to the left second digit metacarpal if continues to be tender.  With increased edema.  Patient also with decreased grip and prehension strength with increased pain.  Patient limited in  functional use of bilateral hands and ADLs and IADLs.  Patient can benefit from skilled OT services to decrease pain and stiffness and increased motion and strength to return to prior level of function.  PERFORMANCE DEFICITS: in functional skills including ADLs, IADLs, ROM, strength, pain, flexibility, decreased knowledge of use of DME, and UE functional use,   and psychosocial skills including environmental adaptation and routines and behaviors.   IMPAIRMENTS: are limiting patient from ADLs, IADLs, rest and sleep, play, leisure, and social participation.   COMORBIDITIES: has no other co-morbidities that affects occupational performance. Patient will benefit from skilled OT to address above impairments and improve overall function.  MODIFICATION OR ASSISTANCE TO COMPLETE EVALUATION: No modification of tasks or assist necessary to complete an evaluation.  OT OCCUPATIONAL PROFILE AND HISTORY: Problem focused assessment: Including review of records relating to presenting problem.  CLINICAL DECISION MAKING: LOW - limited treatment options, no task modification necessary  REHAB POTENTIAL: Good for goals  EVALUATION COMPLEXITY: Low      PLAN:  OT FREQUENCY: 1-2x/week  OT DURATION: 6 wks  PLANNED INTERVENTIONS: 97168 OT Re-evaluation, 97535 self care/ADL training, 02889 therapeutic exercise, 97530 therapeutic activity, 97112 neuromuscular re-education, 97140 manual therapy, 97035 ultrasound, 97018 paraffin, 02960 fluidotherapy, 97010 moist heat, 97760 Orthotic Initial, [id number] Orthotic/Prosthetic subsequent, patient/family education, and DME and/or AE instructions    CONSULTED AND AGREED WITH PLAN OF CARE: Patient     Ancel Peters, OTR/L,CLT 01/14/2024, 5:02 PM

## 2024-01-24 ENCOUNTER — Ambulatory Visit: Attending: Orthopedic Surgery | Admitting: Occupational Therapy

## 2024-01-24 DIAGNOSIS — R208 Other disturbances of skin sensation: Secondary | ICD-10-CM | POA: Diagnosis present

## 2024-01-24 DIAGNOSIS — M25642 Stiffness of left hand, not elsewhere classified: Secondary | ICD-10-CM | POA: Insufficient documentation

## 2024-01-24 DIAGNOSIS — M79642 Pain in left hand: Secondary | ICD-10-CM | POA: Diagnosis present

## 2024-01-24 DIAGNOSIS — M25641 Stiffness of right hand, not elsewhere classified: Secondary | ICD-10-CM | POA: Diagnosis present

## 2024-01-24 DIAGNOSIS — M79641 Pain in right hand: Secondary | ICD-10-CM | POA: Insufficient documentation

## 2024-01-24 DIAGNOSIS — M6281 Muscle weakness (generalized): Secondary | ICD-10-CM | POA: Diagnosis present

## 2024-01-24 NOTE — Therapy (Signed)
 OUTPATIENT OCCUPATIONAL THERAPY ORTHO TREATMENT  Patient Name: Leslie Bautista MRN: [id number] DOB:15-Aug-1960, 63 y.o., female Today's Date: 01/24/2024  PCP: [name] NP REFERRING PROVIDER: Dr Kathlynn  END OF SESSION:  OT End of Session - 01/24/24 1208     Visit Number 3    Number of Visits 12    Date for OT Re-Evaluation 02/18/24    OT Start Time 1205    OT Stop Time 1245    OT Time Calculation (min) 40 min    Activity Tolerance Patient tolerated treatment well    Behavior During Therapy WFL for tasks assessed/performed          Past Medical History:  Diagnosis Date   Allergy    Cancer (HCC)    breast cancer on left side   Cataract    Depression    Diabetes mellitus without complication (HCC)    Hypertension    Morbid obesity with BMI of 50.0-59.9, adult (HCC)    Obstructive sleep apnea    Osteoarthritis of both knees    Pre-diabetes    Sleep apnea    Past Surgical History:  Procedure Laterality Date   ANKLE SURGERY Right    ligament repair   BREAST SURGERY     CATARACT EXTRACTION, BILATERAL Bilateral 2022   COLONOSCOPY  2015   COLONOSCOPY WITH PROPOFOL  N/A 07/26/2023   Procedure: COLONOSCOPY WITH PROPOFOL ;  Surgeon: Unk Corinn Skiff, MD;  Location: [facility];  Service: Gastroenterology;  Laterality: N/A;   EYE SURGERY     JOINT REPLACEMENT Right    Knee   POLYPECTOMY  07/26/2023   Procedure: POLYPECTOMY;  Surgeon: Unk Corinn Skiff, MD;  Location: [facility];  Service: Gastroenterology;;   TONSILLECTOMY Bilateral 06/14/2021   Procedure: TONSILLECTOMY;  Surgeon: Milissa Hamming, MD;  Location: [facility] OR[facility];  Service: ENT;  Laterality: Bilateral;   TOTAL KNEE ARTHROPLASTY Right    TUBAL LIGATION     Patient Active Problem List   Diagnosis Date Noted   Diabetes mellitus without complication (HCC) 09/25/2023   Hx of colonic polyps 07/26/2023   Polyp of ascending colon 07/26/2023   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   Tenesmus (rectal) 06/29/2023    Chronic pain of both lower extremities 06/29/2023   Acute cough 06/29/2023   Need for hepatitis C screening test 06/29/2023   Screening for deficiency anemia 06/29/2023   Encounter to establish care with new doctor 06/29/2023   Neuropathy 06/29/2023   Chemotherapy-induced neutropenia (HCC) 12/05/2022   Thrombocytopenia (HCC) 12/05/2022   Lower urinary tract infectious disease 12/03/2022   Myocardial injury 12/03/2022   Obesity, Class III, BMI 40-49.9 (morbid obesity) 12/03/2022   Breast cancer (HCC) 12/03/2022   HLD (hyperlipidemia) 12/03/2022   OSA on CPAP 12/03/2022   Hypersensitivity reaction 10/24/2022   Invasive ductal carcinoma of breast, left (HCC) 09/26/2022   Endometrial thickening on ultrasound 07/05/2021   CKD stage G3a/A1, 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) 12/10/2019   Dyspareunia due to medical condition in female 10/24/2019   Postmenopause bleeding 03/08/2018   Pre-diabetes 11/22/2015   Depression 06/03/2014   Class 3 severe obesity without serious comorbidity with body mass index (BMI) of 50.0 to 59.9 in adult 01/07/2014   HTN (hypertension) 10/25/2011   Primary localized osteoarthrosis, lower leg 12/19/2007    ONSET DATE: 3-6 months  REFERRING DIAG: Bilateral hand stiffness and pain mostly 3rd through 5th digits  THERAPY DIAG:  Stiffness of left hand, not elsewhere classified  Stiffness of right hand, not elsewhere classified  Pain in left hand  Pain in right hand  Muscle weakness (generalized)  Rationale for Evaluation and Treatment: Rehabilitation  SUBJECTIVE:   SUBJECTIVE STATEMENT:   My hand still goes numb at night.  But I think it is all my fingers.  I did bring my elbow splints in for you to look at and my right middle finger still bothers me in my room left ring and index Pt accompanied by: self  PERTINENT HISTORY: 01/03/24 Dr Kathlynn visit :  DIAGNOSTIC Upper extremity nerve conduction study: Chronic mild sensory  polyneuropathy (11/07/2023) Lower extremity nerve conduction study: Chronic severe sensorimotor polyneuropathy (11/06/2023)    Assessment/Plan:   Assessment & Plan Bilateral hand stiffness and weakness  Chronic bilateral hand stiffness and weakness with difficulty making a fist. Negative for carpal tunnel syndrome and trigger finger. Suspected inflammatory problem affecting tendons, not related to bones or nerves. Possible rheumatologic condition considered, though no joint swelling observed. Diabetes is well-controlled with an A1c of 5.8, and impaired kidney function precludes anti-inflammatory medications. Refer to hand therapy at [facility] outpatient facility on [facility] for therapy twice a week for a month. Instruct to contact [name], the hand therapist, directly for specialized care. Advise to call back if no improvement after a month for re-evaluation. Plan for potential follow-up with a hand specialist, Dr. Ezra, if symptoms persist.  Mild bilateral hand osteoarthritis  Mild osteoarthritis in hand joints, not severe enough to explain current stiffness and weakness. X-rays show minimal arthritis, particularly in the thumb. Diagnoses and all orders for this visit:  Stiffness of joints of both hands - Ambulatory Referral to Occupational Therapy   PRECAUTIONS: 10 pound limit because of cervical surgery about 3 months ago   WEIGHT BEARING RESTRICTIONS: No  PAIN:  Are you having pain? 4/10 in the R third A1 pulley and into the palm and 6/10 over left 2nd  metacarpals fourth PIP and both A1 pulleys FALLS: Has patient fallen in last 6 months? No  LIVING ENVIRONMENT: Lives with: lives with their family   PLOF: Patient on disability.  Patient had breast cancer treatment and then had about 3 months ago cervical surgery.  Patient perform her own ADLs and helps at home with some cleaning some cooking and laundry.  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  PATIENT GOALS: I want the stiffness and the pain better so I can use my hands doing laundry, cooking, bathing, driving.    OBJECTIVE:  Note: Objective measures were completed at Evaluation unless otherwise noted.  HAND DOMINANCE: Left  ADLs: Unable to grasp to cook or laundry or cleaning cannot make a composite fist with increased pain and stiffness in the palm, fingers and forearm-difficulty with bathing and dressing and driving to  FUNCTIONAL OUTCOME MEASURES: To do next session UPPER EXTREMITY ROM:     Active ROM Right eval Left eval  Shoulder flexion    Shoulder abduction    Shoulder adduction    Shoulder extension    Shoulder internal rotation    Shoulder external rotation    Elbow flexion    Elbow extension    Wrist flexion    Wrist extension 60 55  Wrist ulnar deviation    Wrist radial deviation    Wrist pronation    Wrist supination    (Blank rows = not tested)  Active ROM Right eval Left eval R/L 01/14/24 R/L 01/24/24  Thumb MCP (0-60)  Thumb IP (0-80)      Thumb Radial abd/add (0-55)       Thumb Palmar abd/add (0-45)       Thumb Opposition to Small Finger       Index MCP (0-90) 65 75  70/75 60/75  Index PIP (0-100) 95  90 100/95 100/100  Index DIP (0-70)        Long MCP (0-90) 70  80  85/90 90/90  Long PIP (0-100)  90 90  100/90 100/95  Long DIP (0-70)        Ring MCP (0-90) 75  90  85/90 85/90  Ring PIP (0-100)  90  90 100/90 100/95  Ring DIP (0-70)        Little MCP (0-90)  80  90 85/90 90/90  Little PIP (0-100)  90 85  95/90 100/95  Little DIP (0-70)        (Blank rows = not tested) Patient with increased pain in the left more than the right with attempts of composite fist.  HAND FUNCTION:  Grip strength: Right: 26 lbs; Left: 24 lbs, Lateral pinch: Right: 12 lbs, Left: 6 lbs, and 3 point pinch: Right: 9 lbs, Left: 5 lbs 01/14/24  Grip strength: Right: 35 lbs; Left: 24 lbs, Lateral pinch: Right: 12 lbs, Left:  12 lbs, and 3 point pinch: Right: 9 lbs, Left: 5 lbs 01/24/24  Grip strength: Right: 38 lbs; Left: 26 lbs, Lateral pinch: Right: 14 lbs, Left: 12 lbs, and 3 point pinch: Right: 10 lbs, Left: 8 lbs   COORDINATION: Decreased because of increased stiffness in digits unable to make composite fist.  And pain in digit with 3-point pinch with 2-point pinch  SENSATION: Since chemotherapy patient reports no neuropathy in all digits  EDEMA: Patient denies swelling  COGNITION: Overall cognitive status: Within functional limits for tasks assessed     TREATMENT DATE: 01/24/24           Patient showed increased digit flexion as well as decreased pain and stiffness since start of care. As well as increased grip impression strength see flowsheet Continues to be limited mostly and left second metacarpal and fourth metacarpal and A1 pulley On the right hand mostly at the third A1 pulley to the palm with composite flexion at 3rd and 4th                                                                                                                      Modalities: Fluidotherapy:  Time: 8 minutes Location: Bilateral hands and wrist Decrease stiffness and pain- increase AROM in wrist and digits - pain decrease    patient continues to do 3 times a day home exercises prior Soft tissue mobilization over right out of palm pain-free for palms as well as volar forearms 20 reps each   Manual therapy: Done this date Graston tool #2 for sweeping over volar and dorsal forearm with wrist flexion extension.  As well as Graston tool #6 for brushing and  sweeping over volar digits and palm prior to digit flexion extension. Done dry light static cupping 5 cm size on volar forearm 2 times for 10 reps combination wrist flexion extension as well as dorsal forearm 2 cups with wrist flexion extension.  Repeating volar forearm in combination with 1 cup on the palm with digit extension combination with wrist flexion  extension On left and right.  With great improvement in trigger point as well as tight fascia    Followed by tendon glides with focus on metacarpal flexion active assisted if needed 10 reps Followed by blocked intrinsic a fist pain-free not to force the left 3rd through 5th 10 reps  with composite fist to palm on the right and left pain-free 10 reps reinforced Continue with gentle wrist flexion extension.  Patient to enlarge her grips on her utensils as well as her toothbrush.  As well as not over grip steering wheel or when doing household activities. Patient also to use larger joints like her palm and forearms to carry and lift objects.      PATIENT EDUCATION: Education details: findings of eval and HEP  Person educated: Patient Education method: Explanation, Demonstration, Tactile cues, Verbal cues, and Handouts Education comprehension: verbalized understanding, returned demonstration, verbal cues required, and needs further education    GOALS: Goals reviewed with patient? Yes    LONG TERM GOALS: Target date: 6 weeks  Patient to be independent in home program to decrease pain with home exercises to less than 5/10 to be able to increase motion Baseline: Pain 9/10 with decreased MCP flexion on the right 65-80 and PIP is 90-95.  Left MC 75-90 and PIP is 90 to 85 degrees. Goal status: INITIAL  2.  Bilateral digits flexion improved for patient to be able to touch palm with pain less than 2/10 with cutting food, brushing her teeth and driving. Baseline: Patient limited in flexion of all digits with increased pain over the metacarpals as well as the volar palm and forearm.  With increased pain to 9/10 with use Goal status: INITIAL  3.  Patient's pain in bilateral metacarpals as well as palm of forearm decreased to 2/10 to improve active range of motion of bilateral hands and wrist within functional limits Baseline: Pain 9/10 with decreased motion and decreased strength Goal  status: INITIAL  4.  Bilateral grip and prehension strength improved to within normal range for her age to be able to improve her functional use in cooking and laundry symptom free Baseline: Right grip 26 pounds and left 24 pounds with pain.  Lateral pinch 12 pounds on the right and left 6 pounds and 3 point pinch 9 on the right left 5 pounds.  With increased pain with functional use Goal status: INITIAL  5.   PRWHE score for pain and function increased with more than 15 points Baseline:  PRWHE score to be done next session  Goal status: INITIAL  ASSESSMENT:  CLINICAL IMPRESSION: Patient seen today for occupational therapy evaluation for bilateral hand and finger stiffness.  With increased pain.  Left worse than the right.  Patient is left-handed.  Patient had breast cancer with treatment of chemotherapy and report some neuropathy in all digits.  Patient also had cervical surgery about 3 months ago.  Patient with some arthritic changes to bilateral hands.  Patient present to OT evaluation with decreased AROM in all digits with 3rd through 5th the worst.  But patient also limited in second digit avoiding composite fist but using more dexterity  and 3-point pinch.  Patient's left hand pain worse than the right but motion wise right worse than the left.  See flowsheets above for motion.  Patient with increased pain over the metacarpals into the palm as well as the volar forearm.  Thumb range of motion within normal limits and denies any pain.  NOW patient arrived today with improvement in active range of motion and pain   Patient with decreased pain in the forearm as well as webspaces.  Reinforced with patient to focus on composite flexion to right foam roller on the left and right if touching palm is painful.  Plan to initiate iontophoresis with dexamethasone  left 2nd and 4th A1 pulley and right third A1 pulley. Patient also with decreased grip and prehension strength with increased pain.  Patient limited  in functional use of bilateral hands and ADLs and IADLs.  Patient can benefit from skilled OT services to decrease pain and stiffness and increased motion and strength to return to prior level of function.  PERFORMANCE DEFICITS: in functional skills including ADLs, IADLs, ROM, strength, pain, flexibility, decreased knowledge of use of DME, and UE functional use,   and psychosocial skills including environmental adaptation and routines and behaviors.   IMPAIRMENTS: are limiting patient from ADLs, IADLs, rest and sleep, play, leisure, and social participation.   COMORBIDITIES: has no other co-morbidities that affects occupational performance. Patient will benefit from skilled OT to address above impairments and improve overall function.  MODIFICATION OR ASSISTANCE TO COMPLETE EVALUATION: No modification of tasks or assist necessary to complete an evaluation.  OT OCCUPATIONAL PROFILE AND HISTORY: Problem focused assessment: Including review of records relating to presenting problem.  CLINICAL DECISION MAKING: LOW - limited treatment options, no task modification necessary  REHAB POTENTIAL: Good for goals  EVALUATION COMPLEXITY: Low      PLAN:  OT FREQUENCY: 1-2x/week  OT DURATION: 6 wks  PLANNED INTERVENTIONS: 97168 OT Re-evaluation, 97535 self care/ADL training, 02889 therapeutic exercise, 97530 therapeutic activity, 97112 neuromuscular re-education, 97140 manual therapy, 97035 ultrasound, 97018 paraffin, 02960 fluidotherapy, 97010 moist heat, 97760 Orthotic Initial, [id number] Orthotic/Prosthetic subsequent, patient/family education, and DME and/or AE instructions    CONSULTED AND AGREED WITH PLAN OF CARE: Patient     Ancel Peters, OTR/L,CLT 01/24/2024, 12:50 PM

## 2024-02-01 ENCOUNTER — Ambulatory Visit: Admitting: Occupational Therapy

## 2024-02-01 DIAGNOSIS — M79642 Pain in left hand: Secondary | ICD-10-CM

## 2024-02-01 DIAGNOSIS — M25642 Stiffness of left hand, not elsewhere classified: Secondary | ICD-10-CM

## 2024-02-01 DIAGNOSIS — M79641 Pain in right hand: Secondary | ICD-10-CM

## 2024-02-01 DIAGNOSIS — M6281 Muscle weakness (generalized): Secondary | ICD-10-CM

## 2024-02-01 DIAGNOSIS — M25641 Stiffness of right hand, not elsewhere classified: Secondary | ICD-10-CM

## 2024-02-01 NOTE — Therapy (Signed)
 OUTPATIENT OCCUPATIONAL THERAPY ORTHO TREATMENT  Patient Name: Leslie Bautista MRN: [id number] DOB:02-13-1961, 64 y.o., female Today's Date: 02/01/2024  PCP: [name] NP REFERRING PROVIDER: Dr Kathlynn  END OF SESSION:  OT End of Session - 02/01/24 0950     Visit Number 4    Number of Visits 12    Date for OT Re-Evaluation 02/18/24    OT Start Time 0948    OT Stop Time 1040    OT Time Calculation (min) 52 min    Activity Tolerance Patient tolerated treatment well    Behavior During Therapy WFL for tasks assessed/performed          Past Medical History:  Diagnosis Date   Allergy    Cancer (HCC)    breast cancer on left side   Cataract    Depression    Diabetes mellitus without complication (HCC)    Hypertension    Morbid obesity with BMI of 50.0-59.9, adult (HCC)    Obstructive sleep apnea    Osteoarthritis of both knees    Pre-diabetes    Sleep apnea    Past Surgical History:  Procedure Laterality Date   ANKLE SURGERY Right    ligament repair   BREAST SURGERY     CATARACT EXTRACTION, BILATERAL Bilateral 2022   COLONOSCOPY  2015   COLONOSCOPY WITH PROPOFOL  N/A 07/26/2023   Procedure: COLONOSCOPY WITH PROPOFOL ;  Surgeon: Unk Corinn Skiff, MD;  Location: [facility];  Service: Gastroenterology;  Laterality: N/A;   EYE SURGERY     JOINT REPLACEMENT Right    Knee   POLYPECTOMY  07/26/2023   Procedure: POLYPECTOMY;  Surgeon: Unk Corinn Skiff, MD;  Location: [facility];  Service: Gastroenterology;;   TONSILLECTOMY Bilateral 06/14/2021   Procedure: TONSILLECTOMY;  Surgeon: Milissa Hamming, MD;  Location: [facility] ORS;  Service: ENT;  Laterality: Bilateral;   TOTAL KNEE ARTHROPLASTY Right    TUBAL LIGATION     Patient Active Problem List   Diagnosis Date Noted   Diabetes mellitus without complication (HCC) 09/25/2023   Hx of colonic polyps 07/26/2023   Polyp of ascending colon 07/26/2023   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   Tenesmus (rectal) 06/29/2023    Chronic pain of both lower extremities 06/29/2023   Acute cough 06/29/2023   Need for hepatitis C screening test 06/29/2023   Screening for deficiency anemia 06/29/2023   Encounter to establish care with new doctor 06/29/2023   Neuropathy 06/29/2023   Chemotherapy-induced neutropenia (HCC) 12/05/2022   Thrombocytopenia (HCC) 12/05/2022   Lower urinary tract infectious disease 12/03/2022   Myocardial injury 12/03/2022   Obesity, Class III, BMI 40-49.9 (morbid obesity) 12/03/2022   Breast cancer (HCC) 12/03/2022   HLD (hyperlipidemia) 12/03/2022   OSA on CPAP 12/03/2022   Hypersensitivity reaction 10/24/2022   Invasive ductal carcinoma of breast, left (HCC) 09/26/2022   Endometrial thickening on ultrasound 07/05/2021   CKD stage G3a/A1, 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) 12/10/2019   Dyspareunia due to medical condition in female 10/24/2019   Postmenopause bleeding 03/08/2018   Pre-diabetes 11/22/2015   Depression 06/03/2014   Class 3 severe obesity without serious comorbidity with body mass index (BMI) of 50.0 to 59.9 in adult 01/07/2014   HTN (hypertension) 10/25/2011   Primary localized osteoarthrosis, lower leg 12/19/2007    ONSET DATE: 3-6 months  REFERRING DIAG: Bilateral hand stiffness and pain mostly 3rd through 5th digits  THERAPY DIAG:  Stiffness of left hand, not elsewhere classified  Stiffness of right hand, not elsewhere classified  Pain in left hand  Pain in right hand  Muscle weakness (generalized)  Rationale for Evaluation and Treatment: Rehabilitation  SUBJECTIVE:   SUBJECTIVE STATEMENT: My fingers still feels stiff in the left more than the right.  And then pain especially with both ring fingers.  They do lock like once or twice a day.  The left worse than the right  Pt accompanied by: self  PERTINENT HISTORY: 01/03/24 Dr Kathlynn visit :  DIAGNOSTIC Upper extremity nerve conduction study: Chronic mild sensory polyneuropathy  (11/07/2023) Lower extremity nerve conduction study: Chronic severe sensorimotor polyneuropathy (11/06/2023)    Assessment/Plan:   Assessment & Plan Bilateral hand stiffness and weakness  Chronic bilateral hand stiffness and weakness with difficulty making a fist. Negative for carpal tunnel syndrome and trigger finger. Suspected inflammatory problem affecting tendons, not related to bones or nerves. Possible rheumatologic condition considered, though no joint swelling observed. Diabetes is well-controlled with an A1c of 5.8, and impaired kidney function precludes anti-inflammatory medications. Refer to hand therapy at [facility] outpatient facility on [facility] for therapy twice a week for a month. Instruct to contact [name], the hand therapist, directly for specialized care. Advise to call back if no improvement after a month for re-evaluation. Plan for potential follow-up with a hand specialist, Dr. Ezra, if symptoms persist.  Mild bilateral hand osteoarthritis  Mild osteoarthritis in hand joints, not severe enough to explain current stiffness and weakness. X-rays show minimal arthritis, particularly in the thumb. Diagnoses and all orders for this visit:  Stiffness of joints of both hands - Ambulatory Referral to Occupational Therapy   PRECAUTIONS: 10 pound limit because of cervical surgery about 3 months ago   WEIGHT BEARING RESTRICTIONS: No  PAIN:  Are you having pain? 4/10 in the R 4th A1 pulley and into the palm and 6/10 over left 2nd  metacarpals and 4th  PIP and both A1 pulleys FALLS: Has patient fallen in last 6 months? No  LIVING ENVIRONMENT: Lives with: lives with their family   PLOF: Patient on disability.  Patient had breast cancer treatment and then had about 3 months ago cervical surgery.  Patient perform her own ADLs and helps at home with some cleaning some cooking and laundry.  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  PATIENT GOALS: I want the stiffness and the pain better so I can use my hands doing laundry, cooking, bathing, driving.    OBJECTIVE:  Note: Objective measures were completed at Evaluation unless otherwise noted.  HAND DOMINANCE: Left  ADLs: Unable to grasp to cook or laundry or cleaning cannot make a composite fist with increased pain and stiffness in the palm, fingers and forearm-difficulty with bathing and dressing and driving to  FUNCTIONAL OUTCOME MEASURES: To do next session UPPER EXTREMITY ROM:     Active ROM Right eval Left eval  Shoulder flexion    Shoulder abduction    Shoulder adduction    Shoulder extension    Shoulder internal rotation    Shoulder external rotation    Elbow flexion    Elbow extension    Wrist flexion    Wrist extension 60 55  Wrist ulnar deviation    Wrist radial deviation    Wrist pronation    Wrist supination    (Blank rows = not tested)  Active ROM Right eval Left eval R/L 01/14/24 R/L 01/24/24  Thumb MCP (0-60)      Thumb IP (  0-80)      Thumb Radial abd/add (0-55)       Thumb Palmar abd/add (0-45)       Thumb Opposition to Small Finger       Index MCP (0-90) 65 75  70/75 60/75  Index PIP (0-100) 95  90 100/95 100/100  Index DIP (0-70)        Long MCP (0-90) 70  80  85/90 90/90  Long PIP (0-100)  90 90  100/90 100/95  Long DIP (0-70)        Ring MCP (0-90) 75  90  85/90 85/90  Ring PIP (0-100)  90  90 100/90 100/95  Ring DIP (0-70)        Little MCP (0-90)  80  90 85/90 90/90  Little PIP (0-100)  90 85  95/90 100/95  Little DIP (0-70)        (Blank rows = not tested) Patient with increased pain in the left more than the right with attempts of composite fist.  HAND FUNCTION:  Grip strength: Right: 26 lbs; Left: 24 lbs, Lateral pinch: Right: 12 lbs, Left: 6 lbs, and 3 point pinch: Right: 9 lbs, Left: 5 lbs 01/14/24  Grip strength: Right: 35 lbs; Left: 24 lbs, Lateral pinch: Right: 12 lbs, Left: 12 lbs, and 3  point pinch: Right: 9 lbs, Left: 5 lbs 01/24/24  Grip strength: Right: 38 lbs; Left: 26 lbs, Lateral pinch: Right: 14 lbs, Left: 12 lbs, and 3 point pinch: Right: 10 lbs, Left: 8 lbs   COORDINATION: Decreased because of increased stiffness in digits unable to make composite fist.  And pain in digit with 3-point pinch with 2-point pinch  SENSATION: Since chemotherapy patient reports no neuropathy in all digits  EDEMA: Patient denies swelling  COGNITION: Overall cognitive status: Within functional limits for tasks assessed     TREATMENT DATE: 02/01/24          Received verbal order to use iontophoresis with dexamethasone  from Medford Amber, PA of Dr. Kathlynn.  Patient made progress in bilateral hand digit active range of motion up to last visit.  This date patient able to maintain her progress. Continues with increased stiffness in bilateral hands with the right 50% left 80%. Continues to be limited with pain in the left second MCP and tenderness over the A1 pulley as well as fourth PIP with flexion and A1 pulley On the right hand tenderness over the fourth A1 pulley and the PIP with flexion                                                                                                                        Modalities: Fluidotherapy:  Time: 8 minutes Location: Bilateral hands and wrist Decrease stiffness and pain- increase AROM in wrist and digits - pain decrease    patient continues to do 3 times a day home exercises prior Soft tissue mobilization over red foam roller for soft tissue mobilization over  palm pain-free as  well as volar forearms 20 reps each   Manual therapy: Done this date Graston tool #2 for sweeping over volar and dorsal forearm with wrist flexion extension.  As well as Graston tool #6 for brushing and sweeping over volar digits and palm prior to digit flexion extension. Done dry light static cupping 5 cm size on volar forearm 2 times for 10 reps combination wrist  flexion extension as well as dorsal forearm 2 cups with wrist flexion extension.  Repeating volar forearm in combination with 1 cup on the palm with digit extension combination with wrist flexion extension On left and right.  With great improvement in trigger point as well as tight fascia    Followed by tendon glides with focus on metacarpal flexion active assisted if needed 10 reps Followed by blocked intrinsic a fist pain-free to all digits except right and left fourth digit 10 reps with composite fist to palm on the right and left pain-free 10 reps reinforced Continue with wrist flexion extension.  Patient to enlarge her grips on her utensils as well as her toothbrush.  As well as not over grip steering wheel or when doing household activities. Patient also to use larger joints like her palm and forearms to carry and lift objects.   Iontophoresis with dexamethasone  medium patch over right fourth A1 pulley as well as left fourth A1 pulley 2.0 current at 20 minutes-skin check done prior and afterwards.  Patient tolerated well.   PATIENT EDUCATION: Education details: findings of eval and HEP  Person educated: Patient Education method: Explanation, Demonstration, Tactile cues, Verbal cues, and Handouts Education comprehension: verbalized understanding, returned demonstration, verbal cues required, and needs further education    GOALS: Goals reviewed with patient? Yes    LONG TERM GOALS: Target date: 6 weeks  Patient to be independent in home program to decrease pain with home exercises to less than 5/10 to be able to increase motion Baseline: Pain 9/10 with decreased MCP flexion on the right 65-80 and PIP is 90-95.  Left MC 75-90 and PIP is 90 to 85 degrees. Goal status: INITIAL  2.  Bilateral digits flexion improved for patient to be able to touch palm with pain less than 2/10 with cutting food, brushing her teeth and driving. Baseline: Patient limited in flexion of all digits  with increased pain over the metacarpals as well as the volar palm and forearm.  With increased pain to 9/10 with use Goal status: INITIAL  3.  Patient's pain in bilateral metacarpals as well as palm of forearm decreased to 2/10 to improve active range of motion of bilateral hands and wrist within functional limits Baseline: Pain 9/10 with decreased motion and decreased strength Goal status: INITIAL  4.  Bilateral grip and prehension strength improved to within normal range for her age to be able to improve her functional use in cooking and laundry symptom free Baseline: Right grip 26 pounds and left 24 pounds with pain.  Lateral pinch 12 pounds on the right and left 6 pounds and 3 point pinch 9 on the right left 5 pounds.  With increased pain with functional use Goal status: INITIAL  5.   PRWHE score for pain and function increased with more than 15 points Baseline:  PRWHE score to be done next session  Goal status: INITIAL  ASSESSMENT:  CLINICAL IMPRESSION: Patient seen today for occupational therapy evaluation for bilateral hand and finger stiffness.  With increased pain.  Left worse than the right.  Patient is left-handed.  Patient had breast cancer with treatment of chemotherapy and report some neuropathy in all digits.  Patient also had cervical surgery about 3 months ago.  Patient with some arthritic changes to bilateral hands.  Patient present to OT evaluation with decreased AROM in all digits with 3rd through 5th the worst.  But patient also limited in second digit avoiding composite fist but using more dexterity and 3-point pinch.  Patient's left hand pain worse than the right but motion wise right worse than the left.  See flowsheets above for motion.  Patient with increased pain over the metacarpals into the palm as well as the volar forearm.  Thumb range of motion within normal limits and denies any pain.  NOW patient made great improvement improvement in active range of motion and  pain in bilateral hands from start of care.  Since last time patient maintained her progress.  But continues to be limited with pain and tenderness over bilateral fourth A1 pulleys as well as PIP with flexion.  Decreased intrinsic a fist and composite fist.  As well as tenderness over the left second A1 pulley and pain with composite flexion of second digits.  Did get a verbal order from Medford Amber, [place] of Dr. Kathlynn for iontophoresis with dexamethasone .  Did initiate this today.  On bilateral fourth A1 pulleys.  Patient tolerated well.  Patient also with decreased grip and prehension strength with increased pain.  Patient limited in functional use of bilateral hands and ADLs and IADLs.  Patient can benefit from skilled OT services to decrease pain and stiffness and increased motion and strength to return to prior level of function.  PERFORMANCE DEFICITS: in functional skills including ADLs, IADLs, ROM, strength, pain, flexibility, decreased knowledge of use of DME, and UE functional use,   and psychosocial skills including environmental adaptation and routines and behaviors.   IMPAIRMENTS: are limiting patient from ADLs, IADLs, rest and sleep, play, leisure, and social participation.   COMORBIDITIES: has no other co-morbidities that affects occupational performance. Patient will benefit from skilled OT to address above impairments and improve overall function.  MODIFICATION OR ASSISTANCE TO COMPLETE EVALUATION: No modification of tasks or assist necessary to complete an evaluation.  OT OCCUPATIONAL PROFILE AND HISTORY: Problem focused assessment: Including review of records relating to presenting problem.  CLINICAL DECISION MAKING: LOW - limited treatment options, no task modification necessary  REHAB POTENTIAL: Good for goals  EVALUATION COMPLEXITY: Low      PLAN:  OT FREQUENCY: 1-2x/week  OT DURATION: 6 wks  PLANNED INTERVENTIONS: 97168 OT Re-evaluation, 97535 self care/ADL training,  97110 therapeutic exercise, 97530 therapeutic activity, 97112 neuromuscular re-education, 97140 manual therapy, 97035 ultrasound, 02981 paraffin, 97039 fluidotherapy, 97010 moist heat, 97760 Orthotic Initial, [id number] Orthotic/Prosthetic subsequent, patient/family education, and DME and/or AE instructions    CONSULTED AND AGREED WITH PLAN OF CARE: Patient     Ancel Peters, OTR/L,CLT 02/01/2024, 12:32 PM

## 2024-02-03 NOTE — Progress Notes (Signed)
 Department: [facility] at [facility]) Date: 02/04/2024  Event: Canceled/Rescheduled by patient..  Encounter Type: (NP) New patient evaluation.          Advance notice: Less than 24 hr notice.  Reason: Sickness..          Note: Patient will R/S

## 2024-02-04 ENCOUNTER — Ambulatory Visit (HOSPITAL_BASED_OUTPATIENT_CLINIC_OR_DEPARTMENT_OTHER): Admitting: Pain Medicine

## 2024-02-04 DIAGNOSIS — M899 Disorder of bone, unspecified: Secondary | ICD-10-CM | POA: Insufficient documentation

## 2024-02-04 DIAGNOSIS — Z789 Other specified health status: Secondary | ICD-10-CM | POA: Insufficient documentation

## 2024-02-04 DIAGNOSIS — G8929 Other chronic pain: Secondary | ICD-10-CM

## 2024-02-04 DIAGNOSIS — G894 Chronic pain syndrome: Secondary | ICD-10-CM | POA: Insufficient documentation

## 2024-02-04 DIAGNOSIS — Z91199 Patient's noncompliance with other medical treatment and regimen due to unspecified reason: Secondary | ICD-10-CM

## 2024-02-04 DIAGNOSIS — Z79899 Other long term (current) drug therapy: Secondary | ICD-10-CM | POA: Insufficient documentation

## 2024-02-07 ENCOUNTER — Encounter: Attending: Family Medicine | Admitting: Dietician

## 2024-02-07 ENCOUNTER — Encounter: Payer: Self-pay | Admitting: Dietician

## 2024-02-07 DIAGNOSIS — E119 Type 2 diabetes mellitus without complications: Secondary | ICD-10-CM | POA: Insufficient documentation

## 2024-02-07 NOTE — Progress Notes (Signed)
 Patient was seen on 02/07/2024 for the second of a series of three diabetes self-management courses at the [facility]. The following learning objectives were met by the patient during this class:  Describe the role of different macronutrients on glucose Explain how carbohydrates affect blood glucose State what foods contain the most carbohydrates Demonstrate carbohydrate counting Demonstrate how to read Nutrition Facts food label Describe effects of various fats on heart health Describe the importance of good nutrition for health and healthy eating strategies Describe techniques for managing your shopping, cooking and meal planning List strategies to follow meal plan when dining out Describe the effects of alcohol on glucose and how to use it safely  Goals:  Follow Diabetes Meal Plan as instructed  Aim to spread carbs evenly throughout the day  Aim for 3 meals per day and snacks as needed Include lean protein foods to meals/snacks  Monitor glucose levels as instructed by your doctor   Follow-Up Plan: Attend Core 3 Work towards following your personal food plan.

## 2024-02-12 ENCOUNTER — Ambulatory Visit: Admitting: Occupational Therapy

## 2024-02-12 DIAGNOSIS — M25642 Stiffness of left hand, not elsewhere classified: Secondary | ICD-10-CM

## 2024-02-12 DIAGNOSIS — M79642 Pain in left hand: Secondary | ICD-10-CM

## 2024-02-12 DIAGNOSIS — M79641 Pain in right hand: Secondary | ICD-10-CM

## 2024-02-12 DIAGNOSIS — M25641 Stiffness of right hand, not elsewhere classified: Secondary | ICD-10-CM

## 2024-02-12 DIAGNOSIS — M6281 Muscle weakness (generalized): Secondary | ICD-10-CM

## 2024-02-12 DIAGNOSIS — R208 Other disturbances of skin sensation: Secondary | ICD-10-CM

## 2024-02-12 NOTE — Therapy (Signed)
 OUTPATIENT OCCUPATIONAL THERAPY ORTHO TREATMENT  Patient Name: Leslie Bautista MRN: [id number] DOB:11-25-1960, 63 y.o., female Today's Date: 02/12/2024  PCP: [name] NP REFERRING PROVIDER: Dr Kathlynn  END OF SESSION:  OT End of Session - 02/12/24 1122     Visit Number 5    Number of Visits 12    Date for Recertification  02/18/24    OT Start Time 1121    OT Stop Time 1221    OT Time Calculation (min) 60 min    Activity Tolerance Patient tolerated treatment well    Behavior During Therapy WFL for tasks assessed/performed          Past Medical History:  Diagnosis Date   Allergy    Cancer (HCC)    breast cancer on left side   Cataract    Depression    Diabetes mellitus without complication (HCC)    Hypertension    Morbid obesity with BMI of 50.0-59.9, adult (HCC)    Obstructive sleep apnea    Osteoarthritis of both knees    Pre-diabetes    Sleep apnea    Past Surgical History:  Procedure Laterality Date   ANKLE SURGERY Right    ligament repair   BREAST SURGERY     CATARACT EXTRACTION, BILATERAL Bilateral 2022   COLONOSCOPY  2015   COLONOSCOPY WITH PROPOFOL  N/A 07/26/2023   Procedure: COLONOSCOPY WITH PROPOFOL ;  Surgeon: Unk Corinn Skiff, MD;  Location: [facility] ENDOSCOPY;  Service: Gastroenterology;  Laterality: N/A;   EYE SURGERY     JOINT REPLACEMENT Right    Knee   POLYPECTOMY  07/26/2023   Procedure: POLYPECTOMY;  Surgeon: Unk Corinn Skiff, MD;  Location: [facility] ENDOSCOPY;  Service: Gastroenterology;;   TONSILLECTOMY Bilateral 06/14/2021   Procedure: TONSILLECTOMY;  Surgeon: Milissa Hamming, MD;  Location: [facility] ORS;  Service: ENT;  Laterality: Bilateral;   TOTAL KNEE ARTHROPLASTY Right    TUBAL LIGATION     Patient Active Problem List   Diagnosis Date Noted   Chronic pain syndrome 02/04/2024   Pharmacologic therapy 02/04/2024   Disorder of skeletal system 02/04/2024   Problems influencing health status 02/04/2024   Diabetes mellitus without complication  (HCC) 09/25/2023   Cervical osteophyte 08/23/2023   Oropharyngeal dysphagia 08/23/2023   Chemotherapy-induced neuropathy 08/02/2023   Hx of colonic polyps 07/26/2023   Polyp of ascending colon 07/26/2023   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   Tenesmus (rectal) 06/29/2023   Chronic pain of both lower extremities 06/29/2023   Acute cough 06/29/2023   Need for hepatitis C screening test 06/29/2023   Screening for deficiency anemia 06/29/2023   Encounter to establish care with new doctor 06/29/2023   Neuropathy 06/29/2023   Chemotherapy-induced neutropenia 12/05/2022   Thrombocytopenia 12/05/2022   Lower urinary tract infectious disease 12/03/2022   Myocardial injury 12/03/2022   Obesity, Class III, BMI 40-49.9 (morbid obesity) 12/03/2022   Breast cancer (HCC) 12/03/2022   HLD (hyperlipidemia) 12/03/2022   OSA on CPAP 12/03/2022   Hypersensitivity reaction 10/24/2022   Invasive ductal carcinoma of breast, left (HCC) 09/26/2022   Endometrial thickening on ultrasound 07/05/2021   CKD stage G3a/A1, 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) 12/10/2019   Dyspareunia due to medical condition in female 10/24/2019   Postmenopausal bleeding 03/08/2018   Pre-diabetes 11/22/2015   Depression 06/03/2014   Class 3 severe obesity without serious comorbidity with body mass index (BMI) of 50.0 to 59.9 in adult 01/07/2014   Severe obesity (HCC) 01/07/2014   Hypertension, benign 10/25/2011   Primary  localized osteoarthrosis, lower leg 12/19/2007    ONSET DATE: 3-6 months  REFERRING DIAG: Bilateral hand stiffness and pain mostly 3rd through 5th digits  THERAPY DIAG:  Stiffness of left hand, not elsewhere classified  Stiffness of right hand, not elsewhere classified  Pain in left hand  Pain in right hand  Muscle weakness (generalized)  Other disturbances of skin sensation  Rationale for Evaluation and Treatment: Rehabilitation  SUBJECTIVE:   SUBJECTIVE STATEMENT: Both  ring fingers hurting really bad- 7/10 - index fingers feels better- but some numbness at tips of the middle and ring finger on the R   Pt accompanied by: self  PERTINENT HISTORY: 01/03/24 Dr Kathlynn visit :  DIAGNOSTIC Upper extremity nerve conduction study: Chronic mild sensory polyneuropathy (11/07/2023) Lower extremity nerve conduction study: Chronic severe sensorimotor polyneuropathy (11/06/2023)    Assessment/Plan:   Assessment & Plan Bilateral hand stiffness and weakness  Chronic bilateral hand stiffness and weakness with difficulty making a fist. Negative for carpal tunnel syndrome and trigger finger. Suspected inflammatory problem affecting tendons, not related to bones or nerves. Possible rheumatologic condition considered, though no joint swelling observed. Diabetes is well-controlled with an A1c of 5.8, and impaired kidney function precludes anti-inflammatory medications. Refer to hand therapy at [facility] outpatient facility on [facility] for therapy twice a week for a month. Instruct to contact [name], the hand therapist, directly for specialized care. Advise to call back if no improvement after a month for re-evaluation. Plan for potential follow-up with a hand specialist, Dr. Ezra, if symptoms persist.  Mild bilateral hand osteoarthritis  Mild osteoarthritis in hand joints, not severe enough to explain current stiffness and weakness. X-rays show minimal arthritis, particularly in the thumb. Diagnoses and all orders for this visit:  Stiffness of joints of both hands - Ambulatory Referral to Occupational Therapy   PRECAUTIONS: 10 pound limit because of cervical surgery about 3 months ago   WEIGHT BEARING RESTRICTIONS: No  PAIN:  Are you having pain? Bilateral 4th digit 7/10 -  FALLS: Has patient fallen in last 6 months? No  LIVING ENVIRONMENT: Lives with: lives with their family   PLOF: Patient on disability.  Patient had breast cancer treatment and then had  about 3 months ago cervical surgery.  Patient perform her own ADLs and helps at home with some cleaning some cooking and laundry.  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  PATIENT GOALS: I want the stiffness and the pain better so I can use my hands doing laundry, cooking, bathing, driving.    OBJECTIVE:  Note: Objective measures were completed at Evaluation unless otherwise noted.  HAND DOMINANCE: Left  ADLs: Unable to grasp to cook or laundry or cleaning cannot make a composite fist with increased pain and stiffness in the palm, fingers and forearm-difficulty with bathing and dressing and driving to  FUNCTIONAL OUTCOME MEASURES: To do next session UPPER EXTREMITY ROM:     Active ROM Right eval Left eval  Shoulder flexion    Shoulder abduction    Shoulder adduction    Shoulder extension    Shoulder internal rotation    Shoulder external rotation    Elbow flexion    Elbow extension    Wrist flexion    Wrist extension 60 55  Wrist ulnar deviation    Wrist radial deviation    Wrist pronation    Wrist supination    (Blank rows = not tested)  Active  ROM Right eval Left eval R/L 01/14/24 R/L 01/24/24  Thumb MCP (0-60)      Thumb IP (0-80)      Thumb Radial abd/add (0-55)       Thumb Palmar abd/add (0-45)       Thumb Opposition to Small Finger       Index MCP (0-90) 65 75  70/75 60/75  Index PIP (0-100) 95  90 100/95 100/100  Index DIP (0-70)        Long MCP (0-90) 70  80  85/90 90/90  Long PIP (0-100)  90 90  100/90 100/95  Long DIP (0-70)        Ring MCP (0-90) 75  90  85/90 85/90  Ring PIP (0-100)  90  90 100/90 100/95  Ring DIP (0-70)        Little MCP (0-90)  80  90 85/90 90/90  Little PIP (0-100)  90 85  95/90 100/95  Little DIP (0-70)        (Blank rows = not tested) Patient with increased pain in the left more than the right with attempts of composite fist.  HAND FUNCTION:  Grip strength: Right: 26 lbs;  Left: 24 lbs, Lateral pinch: Right: 12 lbs, Left: 6 lbs, and 3 point pinch: Right: 9 lbs, Left: 5 lbs 01/14/24  Grip strength: Right: 35 lbs; Left: 24 lbs, Lateral pinch: Right: 12 lbs, Left: 12 lbs, and 3 point pinch: Right: 9 lbs, Left: 5 lbs 01/24/24  Grip strength: Right: 38 lbs; Left: 26 lbs, Lateral pinch: Right: 14 lbs, Left: 12 lbs, and 3 point pinch: Right: 10 lbs, Left: 8 lbs   COORDINATION: Decreased because of increased stiffness in digits unable to make composite fist.  And pain in digit with 3-point pinch with 2-point pinch  SENSATION: Since chemotherapy patient reports no neuropathy in all digits  EDEMA: Patient denies swelling  COGNITION: Overall cognitive status: Within functional limits for tasks assessed     TREATMENT DATE: 02/12/24          Received verbal order to use iontophoresis with dexamethasone  from Medford Amber, [place] of Dr. Kathlynn.on  02/01/24  Patient made progress in bilateral hand digit active range of motion since [facility]  Cont to be limited moslty in bilateral 4th digit tenderness and pain  As well as stiffness  L worse than R -.  Tenderness over proximal flexors on L as well as increase tightness in flexors with wrist ext and 4th digit flexion  Pt is tender over cubital tunnel on the R - report some numbness in 4th                                                                                                                       Modalities: Contrast  Time: 8 minutes Location: Bilateral hands and wrist Decrease edema and pain as well as stiffness in bilateral fourth digits and index fingers   patient continues to do 3 times a day home exercises  prior Soft tissue mobilization over red foam roller for soft tissue mobilization over  palm pain-free as well as volar forearms 20 reps each   Manual therapy: Done  Graston tool #2 for sweeping over volar and dorsal forearm with wrist flexion extension.  As well as Graston tool #6 for brushing and sweeping over  volar digits and palm prior to digit flexion extension. Done dry light static cupping 5 cm size on volar forearm 3 times for 10 reps combination wrist flexion extension as well as dorsal forearm 2 cups with wrist flexion extension.  Repeating volar forearm in combination with 1 cup on the palm with digit extension combination with wrist flexion extension On left and right.  With great improvement in trigger point as well as tight fascia Patient tender over left proximal forearm flexors    Followed by tendon glides with focus on metacarpal flexion active assisted if needed 10 reps Gentle passive range of motion on the right 4 intrinsic a fist pain-free with a focus pain-free on the fourth Unable to tolerate PIP flexion on the left fourth Only to do passive range of motion to DIP after lumbrical fist with composite fist to palm on the right and left pain-free 10 10 reps partial pain-free Continue with wrist flexion extension.  Patient to enlarge her grips on her utensils as well as her toothbrush.  As well as not over grip steering wheel or when doing household activities. Patient also to use larger joints like her palm and forearms to carry and lift objects.   Iontophoresis with dexamethasone  medium patch over bilateral fourth A1 pulleys 2.0 current at 20 minutes-skin check done prior patient tolerated well can keep patches on for about an hour after leaving.   PATIENT EDUCATION: Education details: findings of eval and HEP  Person educated: Patient Education method: Explanation, Demonstration, Tactile cues, Verbal cues, and Handouts Education comprehension: verbalized understanding, returned demonstration, verbal cues required, and needs further education    GOALS: Goals reviewed with patient? Yes    LONG TERM GOALS: Target date: 6 weeks  Patient to be independent in home program to decrease pain with home exercises to less than 5/10 to be able to increase motion Baseline: Pain  9/10 with decreased MCP flexion on the right 65-80 and PIP is 90-95.  Left MC 75-90 and PIP is 90 to 85 degrees. Goal status: INITIAL  2.  Bilateral digits flexion improved for patient to be able to touch palm with pain less than 2/10 with cutting food, brushing her teeth and driving. Baseline: Patient limited in flexion of all digits with increased pain over the metacarpals as well as the volar palm and forearm.  With increased pain to 9/10 with use Goal status: INITIAL  3.  Patient's pain in bilateral metacarpals as well as palm of forearm decreased to 2/10 to improve active range of motion of bilateral hands and wrist within functional limits Baseline: Pain 9/10 with decreased motion and decreased strength Goal status: INITIAL  4.  Bilateral grip and prehension strength improved to within normal range for her age to be able to improve her functional use in cooking and laundry symptom free Baseline: Right grip 26 pounds and left 24 pounds with pain.  Lateral pinch 12 pounds on the right and left 6 pounds and 3 point pinch 9 on the right left 5 pounds.  With increased pain with functional use Goal status: INITIAL  5.   PRWHE score for pain and function increased with more than  15 points Baseline:  PRWHE score to be done next session  Goal status: INITIAL  ASSESSMENT:  CLINICAL IMPRESSION: Patient seen today for occupational therapy evaluation for bilateral hand and finger stiffness.  With increased pain.  Left worse than the right.  Patient is left-handed.  Patient had breast cancer with treatment of chemotherapy and report some neuropathy in all digits.  Patient also had cervical surgery about 3 months ago.  Patient with some arthritic changes to bilateral hands.  Patient present to OT evaluation with decreased AROM in all digits with 3rd through 5th the worst.  But patient also limited in second digit avoiding composite fist but using more dexterity and 3-point pinch.  Patient's left hand  pain worse than the right but motion wise right worse than the left.  See flowsheets above for motion.  Patient with increased pain over the metacarpals into the palm as well as the volar forearm.  Thumb range of motion within normal limits and denies any pain.  NOW patient made great improvement improvement in active range of motion and pain in bilateral hands from start of care.  Since last time patient maintained her progress.  But continues to be limited with pain and tenderness over bilateral fourth A1 pulleys as well as PIP with flexion.  Decreased intrinsic a fist and composite fist.  Did get a verbal order from Medford Amber, [place] of Dr. Kathlynn for iontophoresis with dexamethasone  initiated ionto today 2 times a week to bilateral fourth A1 pulleys.  Patient tolerated well.  Patient also with decreased grip and prehension strength with increased pain.  Patient limited in functional use of bilateral hands and ADLs and IADLs.  Patient can benefit from skilled OT services to decrease pain and stiffness and increased motion and strength to return to prior level of function.  PERFORMANCE DEFICITS: in functional skills including ADLs, IADLs, ROM, strength, pain, flexibility, decreased knowledge of use of DME, and UE functional use,   and psychosocial skills including environmental adaptation and routines and behaviors.   IMPAIRMENTS: are limiting patient from ADLs, IADLs, rest and sleep, play, leisure, and social participation.   COMORBIDITIES: has no other co-morbidities that affects occupational performance. Patient will benefit from skilled OT to address above impairments and improve overall function.  MODIFICATION OR ASSISTANCE TO COMPLETE EVALUATION: No modification of tasks or assist necessary to complete an evaluation.  OT OCCUPATIONAL PROFILE AND HISTORY: Problem focused assessment: Including review of records relating to presenting problem.  CLINICAL DECISION MAKING: LOW - limited treatment  options, no task modification necessary  REHAB POTENTIAL: Good for goals  EVALUATION COMPLEXITY: Low      PLAN:  OT FREQUENCY: 1-2x/week  OT DURATION: 6 wks  PLANNED INTERVENTIONS: 97168 OT Re-evaluation, 97535 self care/ADL training, 02889 therapeutic exercise, 97530 therapeutic activity, 97112 neuromuscular re-education, 97140 manual therapy, 97035 ultrasound, 97018 paraffin, 02960 fluidotherapy, 97010 moist heat, 97760 Orthotic Initial, [id number] Orthotic/Prosthetic subsequent, patient/family education, and DME and/or AE instructions    CONSULTED AND AGREED WITH PLAN OF CARE: Patient     Ancel Peters, OTR/L,CLT 02/12/2024, 12:49 PM

## 2024-02-14 ENCOUNTER — Ambulatory Visit: Admitting: Occupational Therapy

## 2024-02-14 ENCOUNTER — Encounter: Admitting: Dietician

## 2024-02-14 ENCOUNTER — Encounter: Payer: Self-pay | Admitting: Dietician

## 2024-02-14 DIAGNOSIS — E119 Type 2 diabetes mellitus without complications: Secondary | ICD-10-CM | POA: Diagnosis not present

## 2024-02-14 DIAGNOSIS — M25642 Stiffness of left hand, not elsewhere classified: Secondary | ICD-10-CM

## 2024-02-14 DIAGNOSIS — M79641 Pain in right hand: Secondary | ICD-10-CM

## 2024-02-14 DIAGNOSIS — M79642 Pain in left hand: Secondary | ICD-10-CM

## 2024-02-14 DIAGNOSIS — M6281 Muscle weakness (generalized): Secondary | ICD-10-CM

## 2024-02-14 DIAGNOSIS — M25641 Stiffness of right hand, not elsewhere classified: Secondary | ICD-10-CM

## 2024-02-14 NOTE — Therapy (Signed)
 OUTPATIENT OCCUPATIONAL THERAPY ORTHO TREATMENT  Patient Name: Leslie Bautista MRN: [id number] DOB:[birth year], 63 y.o., female Today's Date: 02/14/2024  PCP: [name] NP REFERRING PROVIDER: Dr Kathlynn  END OF SESSION:  OT End of Session - 02/14/24 1143     Visit Number 6    Number of Visits 12    Date for Recertification  02/18/24    OT Start Time 1130    OT Stop Time 1224    OT Time Calculation (min) 54 min    Activity Tolerance Patient tolerated treatment well    Behavior During Therapy WFL for tasks assessed/performed          Past Medical History:  Diagnosis Date   Allergy    Cancer (HCC)    breast cancer on left side   Cataract    Depression    Diabetes mellitus without complication (HCC)    Hypertension    Morbid obesity with BMI of 50.0-59.9, adult (HCC)    Obstructive sleep apnea    Osteoarthritis of both knees    Pre-diabetes    Sleep apnea    Past Surgical History:  Procedure Laterality Date   ANKLE SURGERY Right    ligament repair   BREAST SURGERY     CATARACT EXTRACTION, BILATERAL Bilateral 2022   COLONOSCOPY  2015   COLONOSCOPY WITH PROPOFOL  N/A 07/26/2023   Procedure: COLONOSCOPY WITH PROPOFOL ;  Surgeon: Unk Corinn Skiff, MD;  Location: [facility] ENDOSCOPY;  Service: Gastroenterology;  Laterality: N/A;   EYE SURGERY     JOINT REPLACEMENT Right    Knee   POLYPECTOMY  07/26/2023   Procedure: POLYPECTOMY;  Surgeon: Unk Corinn Skiff, MD;  Location: [facility] ENDOSCOPY;  Service: Gastroenterology;;   TONSILLECTOMY Bilateral 06/14/2021   Procedure: TONSILLECTOMY;  Surgeon: Milissa Hamming, MD;  Location: [facility] ORS;  Service: ENT;  Laterality: Bilateral;   TOTAL KNEE ARTHROPLASTY Right    TUBAL LIGATION     Patient Active Problem List   Diagnosis Date Noted   Chronic pain syndrome 02/04/2024   Pharmacologic therapy 02/04/2024   Disorder of skeletal system 02/04/2024   Problems influencing health status 02/04/2024   Diabetes mellitus without complication  (HCC) 09/25/2023   Cervical osteophyte 08/23/2023   Oropharyngeal dysphagia 08/23/2023   Chemotherapy-induced neuropathy 08/02/2023   Hx of colonic polyps 07/26/2023   Polyp of ascending colon 07/26/2023   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   Tenesmus (rectal) 06/29/2023   Chronic pain of both lower extremities 06/29/2023   Acute cough 06/29/2023   Need for hepatitis C screening test 06/29/2023   Screening for deficiency anemia 06/29/2023   Encounter to establish care with new doctor 06/29/2023   Neuropathy 06/29/2023   Chemotherapy-induced neutropenia 12/05/2022   Thrombocytopenia 12/05/2022   Lower urinary tract infectious disease 12/03/2022   Myocardial injury 12/03/2022   Obesity, Class III, BMI 40-49.9 (morbid obesity) 12/03/2022   Breast cancer (HCC) 12/03/2022   HLD (hyperlipidemia) 12/03/2022   OSA on CPAP 12/03/2022   Hypersensitivity reaction 10/24/2022   Invasive ductal carcinoma of breast, left (HCC) 09/26/2022   Endometrial thickening on ultrasound 07/05/2021   CKD stage G3a/A1, 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) 12/10/2019   Dyspareunia due to medical condition in female 10/24/2019   Postmenopausal bleeding 03/08/2018   Pre-diabetes 11/22/2015   Depression 06/03/2014   Class 3 severe obesity without serious comorbidity with body mass index (BMI) of 50.0 to 59.9 in adult 01/07/2014   Severe obesity (HCC) 01/07/2014   Hypertension, benign 10/25/2011   Primary  localized osteoarthrosis, lower leg 12/19/2007    ONSET DATE: 3-6 months  REFERRING DIAG: Bilateral hand stiffness and pain mostly 3rd through 5th digits  THERAPY DIAG:  Stiffness of left hand, not elsewhere classified  Stiffness of right hand, not elsewhere classified  Pain in left hand  Pain in right hand  Muscle weakness (generalized)  Rationale for Evaluation and Treatment: Rehabilitation  SUBJECTIVE:   SUBJECTIVE STATEMENT: Both ring fingers continues to to be hurting and  stiff  Pt accompanied by: self  PERTINENT HISTORY: 01/03/24 Dr Kathlynn visit :  DIAGNOSTIC Upper extremity nerve conduction study: Chronic mild sensory polyneuropathy (11/07/2023) Lower extremity nerve conduction study: Chronic severe sensorimotor polyneuropathy (11/06/2023)    Assessment/Plan:   Assessment & Plan Bilateral hand stiffness and weakness  Chronic bilateral hand stiffness and weakness with difficulty making a fist. Negative for carpal tunnel syndrome and trigger finger. Suspected inflammatory problem affecting tendons, not related to bones or nerves. Possible rheumatologic condition considered, though no joint swelling observed. Diabetes is well-controlled with an A1c of 5.8, and impaired kidney function precludes anti-inflammatory medications. Refer to hand therapy at [facility] outpatient facility on [facility] for therapy twice a week for a month. Instruct to contact [name], the hand therapist, directly for specialized care. Advise to call back if no improvement after a month for re-evaluation. Plan for potential follow-up with a hand specialist, Dr. Ezra, if symptoms persist.  Mild bilateral hand osteoarthritis  Mild osteoarthritis in hand joints, not severe enough to explain current stiffness and weakness. X-rays show minimal arthritis, particularly in the thumb. Diagnoses and all orders for this visit:  Stiffness of joints of both hands - Ambulatory Referral to Occupational Therapy   PRECAUTIONS: 10 pound limit because of cervical surgery about 3 months ago   WEIGHT BEARING RESTRICTIONS: No  PAIN:  Are you having pain? Bilateral 4th digit 7/10 -  FALLS: Has patient fallen in last 6 months? No  LIVING ENVIRONMENT: Lives with: lives with their family   PLOF: Patient on disability.  Patient had breast cancer treatment and then had about 3 months ago cervical surgery.  Patient perform her own ADLs and helps at home with some cleaning some cooking and laundry.   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  PATIENT GOALS: I want the stiffness and the pain better so I can use my hands doing laundry, cooking, bathing, driving.    OBJECTIVE:  Note: Objective measures were completed at Evaluation unless otherwise noted.  HAND DOMINANCE: Left  ADLs: Unable to grasp to cook or laundry or cleaning cannot make a composite fist with increased pain and stiffness in the palm, fingers and forearm-difficulty with bathing and dressing and driving to  FUNCTIONAL OUTCOME MEASURES: To do next session UPPER EXTREMITY ROM:     Active ROM Right eval Left eval  Shoulder flexion    Shoulder abduction    Shoulder adduction    Shoulder extension    Shoulder internal rotation    Shoulder external rotation    Elbow flexion    Elbow extension    Wrist flexion    Wrist extension 60 55  Wrist ulnar deviation    Wrist radial deviation    Wrist pronation    Wrist supination    (Blank rows = not tested)  Active ROM Right eval Left eval R/L 01/14/24 R/L 01/24/24  Thumb MCP (0-60)      Thumb IP (0-80)  Thumb Radial abd/add (0-55)       Thumb Palmar abd/add (0-45)       Thumb Opposition to Small Finger       Index MCP (0-90) 65 75  70/75 60/75  Index PIP (0-100) 95  90 100/95 100/100  Index DIP (0-70)        Long MCP (0-90) 70  80  85/90 90/90  Long PIP (0-100)  90 90  100/90 100/95  Long DIP (0-70)        Ring MCP (0-90) 75  90  85/90 85/90  Ring PIP (0-100)  90  90 100/90 100/95  Ring DIP (0-70)        Little MCP (0-90)  80  90 85/90 90/90  Little PIP (0-100)  90 85  95/90 100/95  Little DIP (0-70)        (Blank rows = not tested) Patient with increased pain in the left more than the right with attempts of composite fist.  HAND FUNCTION:  Grip strength: Right: 26 lbs; Left: 24 lbs, Lateral pinch: Right: 12 lbs, Left: 6 lbs, and 3 point pinch: Right: 9 lbs, Left: 5 lbs 01/14/24  Grip strength:  Right: 35 lbs; Left: 24 lbs, Lateral pinch: Right: 12 lbs, Left: 12 lbs, and 3 point pinch: Right: 9 lbs, Left: 5 lbs 01/24/24  Grip strength: Right: 38 lbs; Left: 26 lbs, Lateral pinch: Right: 14 lbs, Left: 12 lbs, and 3 point pinch: Right: 10 lbs, Left: 8 lbs   COORDINATION: Decreased because of increased stiffness in digits unable to make composite fist.  And pain in digit with 3-point pinch with 2-point pinch  SENSATION: Since chemotherapy patient reports no neuropathy in all digits  EDEMA: Patient denies swelling  COGNITION: Overall cognitive status: Within functional limits for tasks assessed     TREATMENT DATE: 02/14/24          Received verbal order to use iontophoresis with dexamethasone  from Medford Amber, PA of Dr. Kathlynn.on  02/01/24  Patient made progress in bilateral hand digit active range of motion since [facility]  Cont to be limited moslty in bilateral 4th digit tenderness and pain  As well as stiffness  L worse than R -.  Tenderness over proximal flexors on L as well as increase tightness in flexors with wrist ext and 4th digit flexion  Pt is tender over cubital tunnel on the R - report some numbness in 4th                                                                                                                       Modalities: Contrast  Time: 8 minutes Location: Bilateral hands and wrist Decrease edema and pain as well as stiffness in bilateral fourth digits and index fingers   patient continues to do 3 times a day home exercises prior Soft tissue mobilization over red foam roller for soft tissue mobilization over  palm pain-free as well as volar forearms 20 reps each  Manual therapy: Done  Graston tool #2 for sweeping over volar and dorsal forearm with wrist flexion extension.  As well as Graston tool #6 for brushing and sweeping over volar digits and palm prior to digit flexion extension.    Followed by tendon glides with focus on metacarpal flexion  active assisted if needed 10 reps Gentle passive range of motion on the right 4 intrinsic a fist pain-free with a focus pain-free on the fourth Unable to tolerate PIP flexion on the left fourth Only to do passive range of motion to DIP after lumbrical fist with composite fist to palm on the right and left pain-free 10 10 reps partial pain-free Continue with wrist flexion extension.  Fabricated for patient right fourth digit MC block splint to sleep with to avoid composite flexion as well as during the day with composite gripping activities like driving, groceries, cooking and things around the house.  To avoid blocking composite flexion to facilitate decreased pain in A1 pulley of the right fourth. Will assess and fabricate 1 for the left neck session.  Patient was educated on donning and doffing and wearing of it  Patient to enlarge her grips on her utensils as well as her toothbrush.  As well as not over grip steering wheel or when doing household activities. Patient also to use larger joints like her palm and forearms to carry and lift objects.   Iontophoresis with dexamethasone  medium patch over bilateral fourth A1 pulleys 2.0 current at 20 minutes-skin check done prior patient tolerated well can keep patches on for about an hour after leaving.   PATIENT EDUCATION: Education details: findings of eval and HEP  Person educated: Patient Education method: Explanation, Demonstration, Tactile cues, Verbal cues, and Handouts Education comprehension: verbalized understanding, returned demonstration, verbal cues required, and needs further education    GOALS: Goals reviewed with patient? Yes    LONG TERM GOALS: Target date: 6 weeks  Patient to be independent in home program to decrease pain with home exercises to less than 5/10 to be able to increase motion Baseline: Pain 9/10 with decreased MCP flexion on the right 65-80 and PIP is 90-95.  Left MC 75-90 and PIP is 90 to 85 degrees. Goal  status: INITIAL  2.  Bilateral digits flexion improved for patient to be able to touch palm with pain less than 2/10 with cutting food, brushing her teeth and driving. Baseline: Patient limited in flexion of all digits with increased pain over the metacarpals as well as the volar palm and forearm.  With increased pain to 9/10 with use Goal status: INITIAL  3.  Patient's pain in bilateral metacarpals as well as palm of forearm decreased to 2/10 to improve active range of motion of bilateral hands and wrist within functional limits Baseline: Pain 9/10 with decreased motion and decreased strength Goal status: INITIAL  4.  Bilateral grip and prehension strength improved to within normal range for her age to be able to improve her functional use in cooking and laundry symptom free Baseline: Right grip 26 pounds and left 24 pounds with pain.  Lateral pinch 12 pounds on the right and left 6 pounds and 3 point pinch 9 on the right left 5 pounds.  With increased pain with functional use Goal status: INITIAL  5.   PRWHE score for pain and function increased with more than 15 points Baseline:  PRWHE score to be done next session  Goal status: INITIAL  ASSESSMENT:  CLINICAL IMPRESSION: Patient seen today for occupational  therapy evaluation for bilateral hand and finger stiffness.  With increased pain.  Left worse than the right.  Patient is left-handed.  Patient had breast cancer with treatment of chemotherapy and report some neuropathy in all digits.  Patient also had cervical surgery about 3 months ago.  Patient with some arthritic changes to bilateral hands.  Patient present to OT evaluation with decreased AROM in all digits with 3rd through 5th the worst.  But patient also limited in second digit avoiding composite fist but using more dexterity and 3-point pinch.  Patient's left hand pain worse than the right but motion wise right worse than the left.  See flowsheets above for motion.  Patient with  increased pain over the metacarpals into the palm as well as the volar forearm.  Thumb range of motion within normal limits and denies any pain.  NOW patient made great improvement improvement in active range of motion and pain in bilateral hands from start of care.  Since last time patient maintained her progress.  But continues to be limited with pain and tenderness over bilateral fourth A1 pulleys as well as PIP with flexion.  Decreased intrinsic a fist and composite fist.  Did get a verbal order from Medford Amber, [place] of Dr. Kathlynn for iontophoresis with dexamethasone  initiated ionto today 2 times a week to bilateral fourth A1 pulleys.  Did fabricate a MC block splint for the right fourth digit today to sleep with to avoid composite flexion as well as during the day wearing it with composite gripping activities to decrease pain at A1 pulley of the fourth.  Will fabricate next week 1 for the left fourth digit.  Patient tolerated well.  Patient also with decreased grip and prehension strength with increased pain.  Patient limited in functional use of bilateral hands and ADLs and IADLs.  Patient can benefit from skilled OT services to decrease pain and stiffness and increased motion and strength to return to prior level of function.  PERFORMANCE DEFICITS: in functional skills including ADLs, IADLs, ROM, strength, pain, flexibility, decreased knowledge of use of DME, and UE functional use,   and psychosocial skills including environmental adaptation and routines and behaviors.   IMPAIRMENTS: are limiting patient from ADLs, IADLs, rest and sleep, play, leisure, and social participation.   COMORBIDITIES: has no other co-morbidities that affects occupational performance. Patient will benefit from skilled OT to address above impairments and improve overall function.  MODIFICATION OR ASSISTANCE TO COMPLETE EVALUATION: No modification of tasks or assist necessary to complete an evaluation.  OT OCCUPATIONAL PROFILE  AND HISTORY: Problem focused assessment: Including review of records relating to presenting problem.  CLINICAL DECISION MAKING: LOW - limited treatment options, no task modification necessary  REHAB POTENTIAL: Good for goals  EVALUATION COMPLEXITY: Low      PLAN:  OT FREQUENCY: 1-2x/week  OT DURATION: 6 wks  PLANNED INTERVENTIONS: 97168 OT Re-evaluation, 97535 self care/ADL training, 02889 therapeutic exercise, 97530 therapeutic activity, 97112 neuromuscular re-education, 97140 manual therapy, 97035 ultrasound, 97018 paraffin, 02960 fluidotherapy, 97010 moist heat, 97760 Orthotic Initial, [id number] Orthotic/Prosthetic subsequent, patient/family education, and DME and/or AE instructions    CONSULTED AND AGREED WITH PLAN OF CARE: Patient     Ancel Peters, OTR/L,CLT 02/14/2024, 2:43 PM

## 2024-02-14 NOTE — Progress Notes (Signed)
 Patient was seen on 02/14/2024 for the third of a series of three diabetes self-management courses at [facility].   State the amount of activity recommended for healthy living Describe activities suitable for individual needs Identify ways to regularly incorporate activity into daily life Identify barriers to activity and ways to over come these barriers Identify diabetes medications being personally used and their primary action for lowering glucose and possible side effects Describe role of stress on blood glucose and develop strategies to address psychosocial issues Identify diabetes complications and ways to prevent them Explain how to manage diabetes during illness Evaluate success in meeting personal goal Establish 2-3 goals that they will plan to diligently work on  Goals:  I will count my carb choices at most meals and snacks I will be active 20 minutes or more 3 times a week I will take my diabetes medications as scheduled  Your patient has identified these potential barriers to change:  Stress   Your patient has identified their diabetes self-care support plan as  [facility] Support Group      Plan:  Attend Support Group as desired

## 2024-02-15 ENCOUNTER — Encounter: Payer: Self-pay | Admitting: Family Medicine

## 2024-02-15 ENCOUNTER — Ambulatory Visit: Admitting: Family Medicine

## 2024-02-15 VITALS — BP 97/51 | HR 63 | Ht 63.5 in | Wt 275.8 lb

## 2024-02-15 DIAGNOSIS — F5104 Psychophysiologic insomnia: Secondary | ICD-10-CM | POA: Diagnosis not present

## 2024-02-15 DIAGNOSIS — Z7984 Long term (current) use of oral hypoglycemic drugs: Secondary | ICD-10-CM | POA: Diagnosis not present

## 2024-02-15 DIAGNOSIS — Z23 Encounter for immunization: Secondary | ICD-10-CM

## 2024-02-15 DIAGNOSIS — E1122 Type 2 diabetes mellitus with diabetic chronic kidney disease: Secondary | ICD-10-CM | POA: Diagnosis not present

## 2024-02-15 DIAGNOSIS — N1831 Chronic kidney disease, stage 3a: Secondary | ICD-10-CM | POA: Diagnosis not present

## 2024-02-15 DIAGNOSIS — E1159 Type 2 diabetes mellitus with other circulatory complications: Secondary | ICD-10-CM

## 2024-02-15 DIAGNOSIS — E119 Type 2 diabetes mellitus without complications: Secondary | ICD-10-CM

## 2024-02-15 DIAGNOSIS — I152 Hypertension secondary to endocrine disorders: Secondary | ICD-10-CM

## 2024-02-15 DIAGNOSIS — G629 Polyneuropathy, unspecified: Secondary | ICD-10-CM

## 2024-02-15 LAB — POCT GLYCOSYLATED HEMOGLOBIN (HGB A1C): Hemoglobin A1C: 5.7 % — AB (ref 4.0–5.6)

## 2024-02-15 MED ORDER — RYBELSUS 3 MG PO TABS: 3.0000 mg | ORAL_TABLET | Freq: Every day | ORAL | 1 refills | Status: DC

## 2024-02-15 NOTE — Progress Notes (Signed)
 Established Patient Office Visit  Subjective   Patient ID: Leslie Bautista, female    DOB: 1961/01/20  Age: 63 y.o. MRN: [id number]  Chief Complaint  Patient presents with   Medical Management of Chronic Issues    Patient is present for f/u dm and HTN. Reports both are doing well with no acute concerns today.    Diabetes   Hypertension    Discussed the use of AI scribe software for clinical note transcription with the patient, who gave verbal consent to proceed.  History of Present Illness Leslie Bautista is a 63 year old female with diabetes, does have concerns for voluminous urination. Urinating approximately four to five times a day with large volumes of urine each time. No dysuria, urgency, or incontinence. She describes her urine is yellow, no malodor, no dysuria, itching, fevers, chills, back pain.  She feels increased thirst but denies any changes in her diet or fluid intake. Her home blood sugar readings have been between 120 and 140 mg/dL. She is currently taking Rybelsus .  Her past medical history includes diabetes, hypertension, and a history of breast cancer treated with anastrozole. She is also on hydrochlorothiazide for HTN, which she has been taking consistently without recent changes.      02/15/2024   11:09 AM 11/29/2023   12:36 PM 11/15/2023   11:26 AM  Depression screen PHQ 2/9  Decreased Interest 0 0 0  Down, Depressed, Hopeless 0 0 2  PHQ - 2 Score 0 0 2  Altered sleeping 2  3  Tired, decreased energy 2  3  Change in appetite 2  2  Feeling bad or failure about yourself  0  0  Trouble concentrating 0  0  Moving slowly or fidgety/restless 0  0  Suicidal thoughts 0  0  PHQ-9 Score 6  10  Difficult doing work/chores Not difficult at all  Not difficult at all       02/15/2024   11:10 AM 11/15/2023   11:27 AM 09/25/2023    8:41 AM 06/29/2023    9:42 AM  GAD 7 : Generalized Anxiety Score  Nervous, Anxious, on Edge 0 0 0 0  Control/stop worrying 0 0 0 0   Worry too much - different things 0 0 0 0  Trouble relaxing 0 0 0 1  Restless 0 0 0 1  Easily annoyed or irritable 0 0 0 0  Afraid - awful might happen 0 0 0 0  Total GAD 7 Score 0 0 0 2  Anxiety Difficulty Not difficult at all Not difficult at all Not difficult at all Not difficult at all     ROS  Negative unless indicated in HPI   Objective:     BP (!) 97/51 (BP Location: Right Arm, Patient Position: Sitting, Cuff Size: Normal)   Pulse 63   Ht 5' 3.5 (1.613 m)   Wt 275 lb 12.8 oz (125.1 kg)   LMP 10/15/2019 Comment: Patient states there is no way she is pregnant.   SpO2 100%   BMI 48.09 kg/m    Physical Exam Constitutional:      General: She is not in acute distress.    Appearance: Normal appearance. She is obese. She is not toxic-appearing or diaphoretic.  HENT:     Head: Normocephalic.     Nose: Nose normal.     Mouth/Throat:     Mouth: Mucous membranes are moist.     Pharynx: Oropharynx is clear.  Eyes:  Extraocular Movements: Extraocular movements intact.     Pupils: Pupils are equal, round, and reactive to light.  Cardiovascular:     Rate and Rhythm: Normal rate and regular rhythm.     Pulses: Normal pulses.     Heart sounds: Normal heart sounds. No murmur heard.    No friction rub. No gallop.  Pulmonary:     Effort: No respiratory distress.     Breath sounds: No stridor. No wheezing, rhonchi or rales.  Chest:     Chest wall: No tenderness.  Musculoskeletal:     Right lower leg: No edema.     Left lower leg: No edema.  Skin:    General: Skin is warm and dry.     Capillary Refill: Capillary refill takes less than 2 seconds.  Neurological:     General: No focal deficit present.     Mental Status: She is alert and oriented to person, place, and time. Mental status is at baseline.  Psychiatric:        Mood and Affect: Mood normal.        Behavior: Behavior normal.        Thought Content: Thought content normal.        Judgment: Judgment normal.       Results for orders placed or performed in visit on 02/15/24  POCT HgB A1C  Result Value Ref Range   Hemoglobin A1C 5.7 (A) 4.0 - 5.6 %   HbA1c POC (<> result, manual entry)     HbA1c, POC (prediabetic range)     HbA1c, POC (controlled diabetic range)        The 10-year ASCVD risk score (Arnett DK, et al., 2019) is: 5.8%    Assessment & Plan:  Diabetes mellitus without complication (HCC) -     POCT glycosylated hemoglobin (Hb A1C) -     Rybelsus ; Take 1 tablet (3 mg total) by mouth daily.  Dispense: 90 tablet; Refill: 1  Immunization due -     Flu vaccine trivalent PF, 6mos and older(Flulaval,Afluria,Fluarix,Fluzone)     Assessment and Plan Assessment & Plan Type 2 diabetes mellitus, CKD stage 3a -  well controlled -  well controlled with an A1c of 5.7.  - She has completed diabetes education classes. - Decrease Rybelsus  dose to 3 mg daily - UTD foot exam and pt to obtain eye exam screening - Repeat A1c in 3 to 6 months - on ARB - on statin   HLD - continue atorvastatin  20mg  nightly  Neuropathy - continue gabapentin  - continue cymbalta - nortriptyline  - mgmt by neurology, recently referral to pain mgmt  Insomnia - continue trazodone  50 nightly prn for sleep  Hypertension Hypertension is well controlled. Home blood pressure readings are around 130s /70s. In-office reading was 97/50. She is on losartan  and hydrochlorothiazide 50/12.5mg  daily - Monitor blood pressure at home and report readings - Consider reducing to just losartan  50 mg if home readings are consistently low  General Health Maintenance She is due for a flu shot and has completed her eye exam. - Administer flu vaccine - Contact eye doctor to send diabetes screening assessment   Return in about 3 months (around 05/16/2024) for HTN and DMII.   I, Curtis DELENA Boom, FNP, have reviewed all documentation for this visit. The documentation on 02/19/24 for the exam, diagnosis, procedures, and  orders are all accurate and complete.   Curtis DELENA Boom, FNP

## 2024-02-18 ENCOUNTER — Ambulatory Visit: Admitting: Occupational Therapy

## 2024-02-18 DIAGNOSIS — M25642 Stiffness of left hand, not elsewhere classified: Secondary | ICD-10-CM | POA: Diagnosis not present

## 2024-02-18 DIAGNOSIS — M25641 Stiffness of right hand, not elsewhere classified: Secondary | ICD-10-CM

## 2024-02-18 DIAGNOSIS — M6281 Muscle weakness (generalized): Secondary | ICD-10-CM

## 2024-02-18 DIAGNOSIS — M79642 Pain in left hand: Secondary | ICD-10-CM

## 2024-02-18 DIAGNOSIS — M79641 Pain in right hand: Secondary | ICD-10-CM

## 2024-02-18 NOTE — Therapy (Signed)
 OUTPATIENT OCCUPATIONAL THERAPY ORTHO TREATMENT  Patient Name: Leslie Bautista MRN: [id number] DOB:Jul 21, 1960, 63 y.o., female Today's Date: 02/18/2024  PCP: [name] NP REFERRING PROVIDER: Dr Kathlynn  END OF SESSION:  OT End of Session - 02/18/24 1326     Visit Number 7    Number of Visits 12    Date for Recertification  02/18/24    OT Start Time 1043    OT Stop Time 1129    OT Time Calculation (min) 46 min    Activity Tolerance Patient tolerated treatment well    Behavior During Therapy WFL for tasks assessed/performed          Past Medical History:  Diagnosis Date   Allergy    Cancer (HCC)    breast cancer on left side   Cataract    Depression    Diabetes mellitus without complication (HCC)    Hypertension    Morbid obesity with BMI of 50.0-59.9, adult (HCC)    Obstructive sleep apnea    Osteoarthritis of both knees    Pre-diabetes    Sleep apnea    Past Surgical History:  Procedure Laterality Date   ANKLE SURGERY Right    ligament repair   BREAST SURGERY     CATARACT EXTRACTION, BILATERAL Bilateral 2022   COLONOSCOPY  2015   COLONOSCOPY WITH PROPOFOL  N/A 07/26/2023   Procedure: COLONOSCOPY WITH PROPOFOL ;  Surgeon: Unk Corinn Skiff, MD;  Location: [facility] ENDOSCOPY;  Service: Gastroenterology;  Laterality: N/A;   EYE SURGERY     JOINT REPLACEMENT Right    Knee   POLYPECTOMY  07/26/2023   Procedure: POLYPECTOMY;  Surgeon: Unk Corinn Skiff, MD;  Location: [facility] ENDOSCOPY;  Service: Gastroenterology;;   TONSILLECTOMY Bilateral 06/14/2021   Procedure: TONSILLECTOMY;  Surgeon: Milissa Hamming, MD;  Location: [facility] ORS;  Service: ENT;  Laterality: Bilateral;   TOTAL KNEE ARTHROPLASTY Right    TUBAL LIGATION     Patient Active Problem List   Diagnosis Date Noted   Chronic pain syndrome 02/04/2024   Pharmacologic therapy 02/04/2024   Disorder of skeletal system 02/04/2024   Problems influencing health status 02/04/2024   Diabetes mellitus without complication  (HCC) 09/25/2023   Cervical osteophyte 08/23/2023   Oropharyngeal dysphagia 08/23/2023   Chemotherapy-induced neuropathy 08/02/2023   Hx of colonic polyps 07/26/2023   Polyp of ascending colon 07/26/2023   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   Tenesmus (rectal) 06/29/2023   Chronic pain of both lower extremities 06/29/2023   Acute cough 06/29/2023   Need for hepatitis C screening test 06/29/2023   Screening for deficiency anemia 06/29/2023   Encounter to establish care with new doctor 06/29/2023   Neuropathy 06/29/2023   Chemotherapy-induced neutropenia 12/05/2022   Thrombocytopenia 12/05/2022   Lower urinary tract infectious disease 12/03/2022   Myocardial injury 12/03/2022   Obesity, Class III, BMI 40-49.9 (morbid obesity) 12/03/2022   Breast cancer (HCC) 12/03/2022   HLD (hyperlipidemia) 12/03/2022   OSA on CPAP 12/03/2022   Hypersensitivity reaction 10/24/2022   Invasive ductal carcinoma of breast, left (HCC) 09/26/2022   Endometrial thickening on ultrasound 07/05/2021   CKD stage G3a/A1, 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) 12/10/2019   Dyspareunia due to medical condition in female 10/24/2019   Postmenopausal bleeding 03/08/2018   Pre-diabetes 11/22/2015   Depression 06/03/2014   Class 3 severe obesity without serious comorbidity with body mass index (BMI) of 50.0 to 59.9 in adult 01/07/2014   Severe obesity (HCC) 01/07/2014   Hypertension, benign 10/25/2011   Primary  localized osteoarthrosis, lower leg 12/19/2007    ONSET DATE: 3-6 months  REFERRING DIAG: Bilateral hand stiffness and pain mostly 3rd through 5th digits  THERAPY DIAG:  Stiffness of left hand, not elsewhere classified  Stiffness of right hand, not elsewhere classified  Pain in left hand  Pain in right hand  Muscle weakness (generalized)  Rationale for Evaluation and Treatment: Rehabilitation  SUBJECTIVE:   SUBJECTIVE STATEMENT: I have seen my family doctor this past Friday.   And my A1c was down.  She decreased my medication.  And then the splint on my right ring finger helped.  My motion is a little bit better and pain   Pt accompanied by: self  PERTINENT HISTORY: 01/03/24 Dr Kathlynn visit :  DIAGNOSTIC Upper extremity nerve conduction study: Chronic mild sensory polyneuropathy (11/07/2023) Lower extremity nerve conduction study: Chronic severe sensorimotor polyneuropathy (11/06/2023)    Assessment/Plan:   Assessment & Plan Bilateral hand stiffness and weakness  Chronic bilateral hand stiffness and weakness with difficulty making a fist. Negative for carpal tunnel syndrome and trigger finger. Suspected inflammatory problem affecting tendons, not related to bones or nerves. Possible rheumatologic condition considered, though no joint swelling observed. Diabetes is well-controlled with an A1c of 5.8, and impaired kidney function precludes anti-inflammatory medications. Refer to hand therapy at [facility] outpatient facility on [facility] for therapy twice a week for a month. Instruct to contact [name], the hand therapist, directly for specialized care. Advise to call back if no improvement after a month for re-evaluation. Plan for potential follow-up with a hand specialist, Dr. Ezra, if symptoms persist.  Mild bilateral hand osteoarthritis  Mild osteoarthritis in hand joints, not severe enough to explain current stiffness and weakness. X-rays show minimal arthritis, particularly in the thumb. Diagnoses and all orders for this visit:  Stiffness of joints of both hands - Ambulatory Referral to Occupational Therapy   PRECAUTIONS: 10 pound limit because of cervical surgery about 3 months ago   WEIGHT BEARING RESTRICTIONS: No  PAIN:  Are you having pain? L4th digit 7/10 -  R 4th A1 pulley 5/10 FALLS: Has patient fallen in last 6 months? No  LIVING ENVIRONMENT: Lives with: lives with their family   PLOF: Patient on disability.  Patient had breast  cancer treatment and then had about 3 months ago cervical surgery.  Patient perform her own ADLs and helps at home with some cleaning some cooking and laundry.  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  PATIENT GOALS: I want the stiffness and the pain better so I can use my hands doing laundry, cooking, bathing, driving.    OBJECTIVE:  Note: Objective measures were completed at Evaluation unless otherwise noted.  HAND DOMINANCE: Left  ADLs: Unable to grasp to cook or laundry or cleaning cannot make a composite fist with increased pain and stiffness in the palm, fingers and forearm-difficulty with bathing and dressing and driving to  FUNCTIONAL OUTCOME MEASURES: To do next session UPPER EXTREMITY ROM:     Active ROM Right eval Left eval  Shoulder flexion    Shoulder abduction    Shoulder adduction    Shoulder extension    Shoulder internal rotation    Shoulder external rotation    Elbow flexion    Elbow extension    Wrist flexion    Wrist extension 60 55  Wrist ulnar deviation    Wrist radial deviation    Wrist pronation  Wrist supination    (Blank rows = not tested)  Active ROM Right eval Left eval R/L 01/14/24 R/L 01/24/24  Thumb MCP (0-60)      Thumb IP (0-80)      Thumb Radial abd/add (0-55)       Thumb Palmar abd/add (0-45)       Thumb Opposition to Small Finger       Index MCP (0-90) 65 75  70/75 60/75  Index PIP (0-100) 95  90 100/95 100/100  Index DIP (0-70)        Long MCP (0-90) 70  80  85/90 90/90  Long PIP (0-100)  90 90  100/90 100/95  Long DIP (0-70)        Ring MCP (0-90) 75  90  85/90 85/90  Ring PIP (0-100)  90  90 100/90 100/95  Ring DIP (0-70)        Little MCP (0-90)  80  90 85/90 90/90  Little PIP (0-100)  90 85  95/90 100/95  Little DIP (0-70)        (Blank rows = not tested) Patient with increased pain in the left more than the right with attempts of composite fist.  HAND  FUNCTION:  Grip strength: Right: 26 lbs; Left: 24 lbs, Lateral pinch: Right: 12 lbs, Left: 6 lbs, and 3 point pinch: Right: 9 lbs, Left: 5 lbs 01/14/24  Grip strength: Right: 35 lbs; Left: 24 lbs, Lateral pinch: Right: 12 lbs, Left: 12 lbs, and 3 point pinch: Right: 9 lbs, Left: 5 lbs 01/24/24  Grip strength: Right: 38 lbs; Left: 26 lbs, Lateral pinch: Right: 14 lbs, Left: 12 lbs, and 3 point pinch: Right: 10 lbs, Left: 8 lbs   COORDINATION: Decreased because of increased stiffness in digits unable to make composite fist.  And pain in digit with 3-point pinch with 2-point pinch  SENSATION: Since chemotherapy patient reports no neuropathy in all digits  EDEMA: Patient denies swelling  COGNITION: Overall cognitive status: Within functional limits for tasks assessed     TREATMENT DATE: 02/18/24          Received verbal order to use iontophoresis with dexamethasone  from Medford Amber, PA of Dr. Kathlynn.on  02/01/24   Patient has been wearing the MCP work splint on the right fourth digit this past weekend to avoid composite flexion and decrease tenderness and pain at the A1 pulley. Patient arrived with increased DIP PIP flexion with less tenderness over the A1 pulley 5/10. Left continues to be 7/10 with decreased DIP PIP flexion and increased pain with motion    Tenderness over proximal flexors on L as well as increase tightness in flexors with wrist ext and 4th digit flexion  Pt is tender over cubital tunnel on the R - report some numbness in 4th                                                                                                                       Modalities:  Contrast  Time: 8 minutes Location: Bilateral hands and wrist Decrease edema and pain as well as stiffness in bilateral fourth digits and index fingers   patient continues to do 3 times a day home exercises prior Soft tissue mobilization over red foam roller for soft tissue mobilization over  palm pain-free as well  as volar forearms 20 reps each   Manual therapy: Done  Graston tool #2 for sweeping over volar and dorsal forearm with wrist flexion extension.  As well as Graston tool #6 for brushing and sweeping over volar digits and palm prior to digit flexion extension.    Followed by tendon glides with focus on metacarpal flexion active assisted if needed 10 reps Gentle passive range of motion on the right 4 intrinsic a fist pain-free with a focus pain-free on the fourth To tolerate DIP PIP flexion on the right fourth ray.  Continue with wrist flexion extension.  Patient has been wearing since last session right fourth digit MC block splint to sleep with to avoid composite flexion as well as during the day with composite gripping activities like driving, groceries, cooking and things around the house.  To avoid blocking composite flexion to facilitate decreased pain in A1 pulley of the right fourth. This date while patient was doing contrast fabricated MP block splint for left fourth to sleep with as well as during the day wearing it with composite flexion to decrease pain and tenderness over the A1 pulley Reviewed with patient donning and doffing and wearing  Patient to enlarge her grips on her utensils as well as her toothbrush.  As well as not over grip steering wheel or when doing household activities. Patient also to use larger joints like her palm and forearms to carry and lift objects.   Iontophoresis with dexamethasone  medium patch over bilateral fourth A1 pulleys 2.0 current at 20 minutes-skin check done prior patient tolerated well can keep patches on for about an hour after leaving.   PATIENT EDUCATION: Education details: findings of eval and HEP  Person educated: Patient Education method: Explanation, Demonstration, Tactile cues, Verbal cues, and Handouts Education comprehension: verbalized understanding, returned demonstration, verbal cues required, and needs further education     GOALS: Goals reviewed with patient? Yes    LONG TERM GOALS: Target date: 6 weeks  Patient to be independent in home program to decrease pain with home exercises to less than 5/10 to be able to increase motion Baseline: Pain 9/10 with decreased MCP flexion on the right 65-80 and PIP is 90-95.  Left MC 75-90 and PIP is 90 to 85 degrees. Goal status: INITIAL  2.  Bilateral digits flexion improved for patient to be able to touch palm with pain less than 2/10 with cutting food, brushing her teeth and driving. Baseline: Patient limited in flexion of all digits with increased pain over the metacarpals as well as the volar palm and forearm.  With increased pain to 9/10 with use Goal status: INITIAL  3.  Patient's pain in bilateral metacarpals as well as palm of forearm decreased to 2/10 to improve active range of motion of bilateral hands and wrist within functional limits Baseline: Pain 9/10 with decreased motion and decreased strength Goal status: INITIAL  4.  Bilateral grip and prehension strength improved to within normal range for her age to be able to improve her functional use in cooking and laundry symptom free Baseline: Right grip 26 pounds and left 24 pounds with pain.  Lateral pinch 12 pounds on the right  and left 6 pounds and 3 point pinch 9 on the right left 5 pounds.  With increased pain with functional use Goal status: INITIAL  5.   PRWHE score for pain and function increased with more than 15 points Baseline:  PRWHE score to be done next session  Goal status: INITIAL  ASSESSMENT:  CLINICAL IMPRESSION: Patient seen today for occupational therapy evaluation for bilateral hand and finger stiffness.  With increased pain.  Left worse than the right.  Patient is left-handed.  Patient had breast cancer with treatment of chemotherapy and report some neuropathy in all digits.  Patient also had cervical surgery about 3 months ago.  Patient with some arthritic changes to bilateral  hands.  Patient present to OT evaluation with decreased AROM in all digits with 3rd through 5th the worst.  But patient also limited in second digit avoiding composite fist but using more dexterity and 3-point pinch.  Patient's left hand pain worse than the right but motion wise right worse than the left.  See flowsheets above for motion.  Patient with increased pain over the metacarpals into the palm as well as the volar forearm.  Thumb range of motion within normal limits and denies any pain.  NOW patient made great improvement improvement in active range of motion and pain in bilateral hands from start of care.  Since last time patient maintained her progress.  Did get a verbal order from Medford Amber, [place] of Dr. Kathlynn for iontophoresis with dexamethasone  initiated ionto today 2 times a week to bilateral fourth A1 pulleys.  Since last visit patient showed decreased pain and tenderness over the right fourth A1 pulley.  With the use of MC block splint for the right fourth digit  to sleep with to avoid composite flexion as well as during the day wearing it with composite gripping activities to decrease pain at A1 pulley of the fourth.  Fabricated this date 1 for the left fourth digit to wear the same than the right.  Patient had increased DIP PIP flexion with less pain on the right fourth this date.  Patient tolerated well.  Patient also with decreased grip and prehension strength with increased pain.  Patient limited in functional use of bilateral hands and ADLs and IADLs.  Patient can benefit from skilled OT services to decrease pain and stiffness and increased motion and strength to return to prior level of function.  PERFORMANCE DEFICITS: in functional skills including ADLs, IADLs, ROM, strength, pain, flexibility, decreased knowledge of use of DME, and UE functional use,   and psychosocial skills including environmental adaptation and routines and behaviors.   IMPAIRMENTS: are limiting patient from ADLs,  IADLs, rest and sleep, play, leisure, and social participation.   COMORBIDITIES: has no other co-morbidities that affects occupational performance. Patient will benefit from skilled OT to address above impairments and improve overall function.  MODIFICATION OR ASSISTANCE TO COMPLETE EVALUATION: No modification of tasks or assist necessary to complete an evaluation.  OT OCCUPATIONAL PROFILE AND HISTORY: Problem focused assessment: Including review of records relating to presenting problem.  CLINICAL DECISION MAKING: LOW - limited treatment options, no task modification necessary  REHAB POTENTIAL: Good for goals  EVALUATION COMPLEXITY: Low      PLAN:  OT FREQUENCY: 1-2x/week  OT DURATION: 6 wks  PLANNED INTERVENTIONS: 97168 OT Re-evaluation, 97535 self care/ADL training, 02889 therapeutic exercise, 97530 therapeutic activity, 97112 neuromuscular re-education, 97140 manual therapy, 97035 ultrasound, 97018 paraffin, 02960 fluidotherapy, 97010 moist heat, 97760 Orthotic Initial, [id number] Orthotic/Prosthetic  subsequent, patient/family education, and DME and/or AE instructions    CONSULTED AND AGREED WITH PLAN OF CARE: Patient     Ancel Peters, OTR/L,CLT 02/18/2024, 1:28 PM

## 2024-02-19 ENCOUNTER — Encounter: Payer: Self-pay | Admitting: Family Medicine

## 2024-02-19 DIAGNOSIS — F5104 Psychophysiologic insomnia: Secondary | ICD-10-CM | POA: Insufficient documentation

## 2024-02-21 ENCOUNTER — Ambulatory Visit: Attending: Orthopedic Surgery | Admitting: Occupational Therapy

## 2024-02-21 DIAGNOSIS — M6281 Muscle weakness (generalized): Secondary | ICD-10-CM | POA: Diagnosis present

## 2024-02-21 DIAGNOSIS — R208 Other disturbances of skin sensation: Secondary | ICD-10-CM | POA: Diagnosis present

## 2024-02-21 DIAGNOSIS — M25642 Stiffness of left hand, not elsewhere classified: Secondary | ICD-10-CM | POA: Diagnosis present

## 2024-02-21 DIAGNOSIS — M79641 Pain in right hand: Secondary | ICD-10-CM | POA: Insufficient documentation

## 2024-02-21 DIAGNOSIS — M79642 Pain in left hand: Secondary | ICD-10-CM | POA: Diagnosis present

## 2024-02-21 DIAGNOSIS — M25641 Stiffness of right hand, not elsewhere classified: Secondary | ICD-10-CM | POA: Insufficient documentation

## 2024-02-23 NOTE — Therapy (Signed)
 OUTPATIENT OCCUPATIONAL THERAPY ORTHO TREATMENT/RECERTIFICATION  Patient Name: Leslie Bautista MRN: [id number] DOB:1960/06/14, 63 y.o., female   PCP: CLifton NP REFERRING PROVIDER: Dr Kathlynn  END OF SESSION:  OT End of Session - 02/23/24 2129     Visit Number 8    Number of Visits 16    Date for Recertification  03/27/24    OT Start Time 1200    OT Stop Time 1247    OT Time Calculation (min) 47 min    Activity Tolerance Patient tolerated treatment well    Behavior During Therapy WFL for tasks assessed/performed          Past Medical History:  Diagnosis Date   Allergy    Cancer (HCC)    breast cancer on left side   Cataract    Depression    Diabetes mellitus without complication (HCC)    Hypertension    Morbid obesity with BMI of 50.0-59.9, adult (HCC)    Obstructive sleep apnea    Osteoarthritis of both knees    Pre-diabetes    Sleep apnea    Past Surgical History:  Procedure Laterality Date   ANKLE SURGERY Right    ligament repair   BREAST SURGERY     CATARACT EXTRACTION, BILATERAL Bilateral 2022   COLONOSCOPY  2015   COLONOSCOPY WITH PROPOFOL  N/A 07/26/2023   Procedure: COLONOSCOPY WITH PROPOFOL ;  Surgeon: Unk Corinn Skiff, MD;  Location: [facility] ENDOSCOPY;  Service: Gastroenterology;  Laterality: N/A;   EYE SURGERY     JOINT REPLACEMENT Right    Knee   POLYPECTOMY  07/26/2023   Procedure: POLYPECTOMY;  Surgeon: Unk Corinn Skiff, MD;  Location: [facility] ENDOSCOPY;  Service: Gastroenterology;;   TONSILLECTOMY Bilateral 06/14/2021   Procedure: TONSILLECTOMY;  Surgeon: Milissa Hamming, MD;  Location: [facility] ORS;  Service: ENT;  Laterality: Bilateral;   TOTAL KNEE ARTHROPLASTY Right    TUBAL LIGATION     Patient Active Problem List   Diagnosis Date Noted   Psychophysiological insomnia 02/19/2024   Chronic pain syndrome 02/04/2024   Pharmacologic therapy 02/04/2024   Disorder of skeletal system 02/04/2024   Problems influencing health status 02/04/2024    Diabetes mellitus without complication (HCC) 09/25/2023   Cervical osteophyte 08/23/2023   Oropharyngeal dysphagia 08/23/2023   Chemotherapy-induced neuropathy 08/02/2023   Hx of colonic polyps 07/26/2023   Polyp of ascending colon 07/26/2023   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   Tenesmus (rectal) 06/29/2023   Chronic pain of both lower extremities 06/29/2023   Acute cough 06/29/2023   Need for hepatitis C screening test 06/29/2023   Screening for deficiency anemia 06/29/2023   Encounter to establish care with new doctor 06/29/2023   Neuropathy 06/29/2023   Chemotherapy-induced neutropenia 12/05/2022   Thrombocytopenia 12/05/2022   Lower urinary tract infectious disease 12/03/2022   Myocardial injury 12/03/2022   Obesity, Class III, BMI 40-49.9 (morbid obesity) (HCC) 12/03/2022   Breast cancer (HCC) 12/03/2022   HLD (hyperlipidemia) 12/03/2022   OSA on CPAP 12/03/2022   Hypersensitivity reaction 10/24/2022   Invasive ductal carcinoma of breast, left (HCC) 09/26/2022   Endometrial thickening on ultrasound 07/05/2021   CKD stage G3a/A1, 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) 12/10/2019   Dyspareunia due to medical condition in female 10/24/2019   Postmenopausal bleeding 03/08/2018   Pre-diabetes 11/22/2015   Depression 06/03/2014   Class 3 severe obesity without serious comorbidity with body mass index (BMI) of 50.0 to 59.9 in adult (HCC) 01/07/2014   Severe obesity (HCC) 01/07/2014   Hypertension,  benign 10/25/2011   Primary localized osteoarthrosis, lower leg 12/19/2007    ONSET DATE: 3-6 months  REFERRING DIAG: Bilateral hand stiffness and pain mostly 3rd through 5th digits  THERAPY DIAG:  Stiffness of left hand, not elsewhere classified  Stiffness of right hand, not elsewhere classified  Pain in left hand  Pain in right hand  Muscle weakness (generalized)  Other disturbances of skin sensation  Rationale for Evaluation and Treatment:  Rehabilitation  SUBJECTIVE:   SUBJECTIVE STATEMENT: See below in treatment note for subjective info Pt accompanied by: self  PERTINENT HISTORY: 01/03/24 Dr Kathlynn visit :  DIAGNOSTIC Upper extremity nerve conduction study: Chronic mild sensory polyneuropathy (11/07/2023) Lower extremity nerve conduction study: Chronic severe sensorimotor polyneuropathy (11/06/2023)  Assessment/Plan:   Assessment & Plan Bilateral hand stiffness and weakness  Chronic bilateral hand stiffness and weakness with difficulty making a fist. Negative for carpal tunnel syndrome and trigger finger. Suspected inflammatory problem affecting tendons, not related to bones or nerves. Possible rheumatologic condition considered, though no joint swelling observed. Diabetes is well-controlled with an A1c of 5.8, and impaired kidney function precludes anti-inflammatory medications. Refer to hand therapy at [facility] outpatient facility on [facility] for therapy twice a week for a month. Instruct to contact [name], the hand therapist, directly for specialized care. Advise to call back if no improvement after a month for re-evaluation. Plan for potential follow-up with a hand specialist, Dr. Ezra, if symptoms persist.  Mild bilateral hand osteoarthritis  Mild osteoarthritis in hand joints, not severe enough to explain current stiffness and weakness. X-rays show minimal arthritis, particularly in the thumb. Diagnoses and all orders for this visit:  Stiffness of joints of both hands - Ambulatory Referral to Occupational Therapy   PRECAUTIONS: 10 pound limit because of cervical surgery about 3 months ago   WEIGHT BEARING RESTRICTIONS: No  PAIN:  Are you having pain? L4th digit 7/10 -  R 4th A1 pulley 5/10 FALLS: Has patient fallen in last 6 months? No  LIVING ENVIRONMENT: Lives with: lives with their family   PLOF: Patient on disability.  Patient had breast cancer treatment and then had about 3 months ago  cervical surgery.  Patient perform her own ADLs and helps at home with some cleaning some cooking and laundry.  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  PATIENT GOALS: I want the stiffness and the pain better so I can use my hands doing laundry, cooking, bathing, driving.  OBJECTIVE:  Note: Objective measures were completed at Evaluation unless otherwise noted.  HAND DOMINANCE: Left  ADLs: Unable to grasp to cook or laundry or cleaning cannot make a composite fist with increased pain and stiffness in the palm, fingers and forearm-difficulty with bathing and dressing and driving to  FUNCTIONAL OUTCOME MEASURES: To do next session UPPER EXTREMITY ROM:     Active ROM Right eval Left eval  Shoulder flexion    Shoulder abduction    Shoulder adduction    Shoulder extension    Shoulder internal rotation    Shoulder external rotation    Elbow flexion    Elbow extension    Wrist flexion    Wrist extension 60 55  Wrist ulnar deviation    Wrist radial deviation    Wrist pronation    Wrist supination    (Blank rows = not tested)  Active ROM Right eval Left eval R/L 01/14/24 R/L 01/24/24  Thumb MCP (0-60)  Thumb IP (0-80)      Thumb Radial abd/add (0-55)       Thumb Palmar abd/add (0-45)       Thumb Opposition to Small Finger       Index MCP (0-90) 65 75  70/75 60/75  Index PIP (0-100) 95  90 100/95 100/100  Index DIP (0-70)        Long MCP (0-90) 70  80  85/90 90/90  Long PIP (0-100)  90 90  100/90 100/95  Long DIP (0-70)        Ring MCP (0-90) 75  90  85/90 85/90  Ring PIP (0-100)  90  90 100/90 100/95  Ring DIP (0-70)        Little MCP (0-90)  80  90 85/90 90/90  Little PIP (0-100)  90 85  95/90 100/95  Little DIP (0-70)        (Blank rows = not tested) Patient with increased pain in the left more than the right with attempts of composite fist.  HAND FUNCTION:  Grip strength: Right: 26 lbs; Left: 24 lbs, Lateral  pinch: Right: 12 lbs, Left: 6 lbs, and 3 point pinch: Right: 9 lbs, Left: 5 lbs 01/14/24  Grip strength: Right: 35 lbs; Left: 24 lbs, Lateral pinch: Right: 12 lbs, Left: 12 lbs, and 3 point pinch: Right: 9 lbs, Left: 5 lbs 01/24/24  Grip strength: Right: 38 lbs; Left: 26 lbs, Lateral pinch: Right: 14 lbs, Left: 12 lbs, and 3 point pinch: Right: 10 lbs, Left: 8 lbs   COORDINATION: Decreased because of increased stiffness in digits unable to make composite fist.  And pain in digit with 3-point pinch with 2-point pinch  SENSATION: Since chemotherapy patient reports no neuropathy in all digits  EDEMA: Patient denies swelling  COGNITION: Overall cognitive status: Within functional limits for tasks assessed   TREATMENT DATE: 02/21/24        Pt reports she is feeling a bit better, decreased pain.  Still wearing splints as needed.    Received verbal order to use iontophoresis with dexamethasone  from [place] Amber, [place] of Dr. Kathlynn.on  02/01/24  Patient has been wearing the MCP work splint on the right fourth digit this past weekend to avoid composite flexion and decrease tenderness and pain at the A1 pulley. Patient arrived with increased DIP PIP flexion with less tenderness over the A1 pulley 4-5/10. Left continues to be 6/10 with decreased DIP PIP flexion and increased pain with motion  Tenderness over proximal flexors on L as well as increase tightness in flexors with wrist ext and 4th digit flexion  Pt remains tender over cubital tunnel on the R - report some numbness in 4th                                                                                                                      Modalities: Contrast  Time: 8 minutes Location: Bilateral hands and wrist Decrease edema and pain as well as stiffness in  bilateral fourth digits and index fingers   patient continues to do 3 times a day home exercises  Soft tissue mobilization over red foam roller for soft tissue mobilization over  palm  pain-free as well as volar forearms 20 reps each  Manual therapy: Therapist performed manual therapy skills with use of Graston tool #2 for sweeping over volar and dorsal forearm with wrist flexion extension,  Graston tool #6 for brushing and sweeping over volar digits and palm prior to digit flexion extension.  Tendon glides with focus on metacarpal flexion 10 reps Gentle passive range of motion on the right 4 intrinsic a fist pain-free with a focus pain-free on the fourth Continue with wrist flexion extension.  Patient has been wearing since last session right fourth digit MC block splint to sleep with to avoid composite flexion as well as during the day with composite gripping activities like driving, groceries, cooking and things around the house.  To avoid blocking composite flexion to facilitate decreased pain in A1 pulley of the right fourth. Patient to enlarge her grips on her utensils as well as her toothbrush.  As well as not over grip steering wheel or when doing household activities. Patient also to use larger joints like her palm and forearms to carry and lift objects.   Iontophoresis with dexamethasone  medium patch over bilateral fourth A1 pulleys 2.0 current at 20 minutes-skin check done prior with skin intact. patient tolerated well can keep patches on for about an hour after leaving.   PATIENT EDUCATION: Education details: findings of eval and HEP  Person educated: Patient Education method: Explanation, Demonstration, Tactile cues, Verbal cues, and Handouts Education comprehension: verbalized understanding, returned demonstration, verbal cues required, and needs further education    GOALS: Goals reviewed with patient? Yes  LONG TERM GOALS: Target date: 6 weeks  Patient to be independent in home program to decrease pain with home exercises to less than 5/10 to be able to increase motion Baseline: Pain 9/10 with decreased MCP flexion on the right 65-80 and PIP is 90-95.   Left MC 75-90 and PIP is 90 to 85 degrees. Goal status: Partial met, home program changing with progression and pain decreased overall   2.  Bilateral digits flexion improved for patient to be able to touch palm with pain less than 2/10 with cutting food, brushing her teeth and driving. Baseline: Patient limited in flexion of all digits with increased pain over the metacarpals as well as the volar palm and forearm.  With increased pain to 9/10 with use Goal status: ONGOING  3.  Patient's pain in bilateral metacarpals as well as palm of forearm decreased to 2/10 to improve active range of motion of bilateral hands and wrist within functional limits Baseline: Pain 9/10 with decreased motion and decreased strength Goal status: ONGOING  4.  Bilateral grip and prehension strength improved to within normal range for her age to be able to improve her functional use in cooking and laundry symptom free Baseline: Right grip 26 pounds and left 24 pounds with pain.  Lateral pinch 12 pounds on the right and left 6 pounds and 3 point pinch 9 on the right left 5 pounds.  With increased pain with functional use Goal status: ONGOING    ASSESSMENT:  CLINICAL IMPRESSION: Patient seen today for occupational therapy evaluation for bilateral hand and finger stiffness.  With increased pain.  Left worse than the right.  Patient is left-handed.  Patient had breast cancer with treatment of chemotherapy and report  some neuropathy in all digits.  Patient also had cervical surgery about 3 months ago.  Patient with some arthritic changes to bilateral hands.  Patient present to OT evaluation with decreased AROM in all digits with 3rd through 5th the worst.  But patient also limited in second digit avoiding composite fist but using more dexterity and 3-point pinch.  Patient's left hand pain worse than the right but motion wise right worse than the left.  See flowsheets above for motion.  Patient with increased pain over the  metacarpals into the palm as well as the volar forearm.  Thumb range of motion within normal limits and denies any pain.  NOW patient continues to make great improvement in active range of motion and pain decreased in bilateral hands from start of care. Recently received a verbal order from [place] Amber, [place] of Dr. Kathlynn for iontophoresis with dexamethasone  initiated ionto 2 times a week to bilateral fourth A1 pulleys.  Since last visit patient showed decreased pain and tenderness over the right fourth A1 pulley.  With the use of MC block splint for the right fourth digit  to sleep with to avoid composite flexion as well as during the day wearing it with composite gripping activities to decrease pain at A1 pulley of the fourth. Replaced padding on splints this date to decrease any irritation.   Patient limited in functional use of bilateral hands and ADLs and IADLs.  Patient continues to benefit from skilled OT services to decrease pain and stiffness and increased motion and strength to return to prior level of function.  PERFORMANCE DEFICITS: in functional skills including ADLs, IADLs, ROM, strength, pain, flexibility, decreased knowledge of use of DME, and UE functional use,   and psychosocial skills including environmental adaptation and routines and behaviors.   IMPAIRMENTS: are limiting patient from ADLs, IADLs, rest and sleep, play, leisure, and social participation.   COMORBIDITIES: has no other co-morbidities that affects occupational performance. Patient will benefit from skilled OT to address above impairments and improve overall function.  MODIFICATION OR ASSISTANCE TO COMPLETE EVALUATION: No modification of tasks or assist necessary to complete an evaluation.  OT OCCUPATIONAL PROFILE AND HISTORY: Problem focused assessment: Including review of records relating to presenting problem.  CLINICAL DECISION MAKING: LOW - limited treatment options, no task modification necessary  REHAB POTENTIAL:  Good for goals  EVALUATION COMPLEXITY: Low   PLAN:  OT FREQUENCY: 1-2x/week  OT DURATION: 6 wks  PLANNED INTERVENTIONS: 97168 OT Re-evaluation, 97535 self care/ADL training, 02889 therapeutic exercise, 97530 therapeutic activity, 97112 neuromuscular re-education, 97140 manual therapy, 97035 ultrasound, 97018 paraffin, 02960 fluidotherapy, 97010 moist heat, 97760 Orthotic Initial, [id number] Orthotic/Prosthetic subsequent, patient/family education, and DME and/or AE instructions  CONSULTED AND AGREED WITH PLAN OF CARE: Patient  Kamil Hanigan, OTR/L,CLT 02/23/2024, 9:32 PM

## 2024-02-25 ENCOUNTER — Ambulatory Visit: Admitting: Occupational Therapy

## 2024-02-25 DIAGNOSIS — M25641 Stiffness of right hand, not elsewhere classified: Secondary | ICD-10-CM

## 2024-02-25 DIAGNOSIS — M25642 Stiffness of left hand, not elsewhere classified: Secondary | ICD-10-CM | POA: Diagnosis not present

## 2024-02-25 DIAGNOSIS — M6281 Muscle weakness (generalized): Secondary | ICD-10-CM

## 2024-02-25 DIAGNOSIS — M79641 Pain in right hand: Secondary | ICD-10-CM

## 2024-02-25 DIAGNOSIS — M79642 Pain in left hand: Secondary | ICD-10-CM

## 2024-02-25 NOTE — Therapy (Signed)
 OUTPATIENT OCCUPATIONAL THERAPY ORTHO TREATMENT  Patient Name: Leslie Bautista MRN: [id number] DOB:[birth year], 63 y.o., female Today's Date: 02/25/2024  PCP: [name] NP REFERRING PROVIDER: Dr Kathlynn  END OF SESSION:  OT End of Session - 02/25/24 1041     Visit Number 9    Number of Visits 16    Date for Recertification  03/27/24    OT Start Time 1032    OT Stop Time 1127    OT Time Calculation (min) 55 min    Activity Tolerance Patient tolerated treatment well    Behavior During Therapy WFL for tasks assessed/performed          Past Medical History:  Diagnosis Date   Allergy    Cancer (HCC)    breast cancer on left side   Cataract    Depression    Diabetes mellitus without complication (HCC)    Hypertension    Morbid obesity with BMI of 50.0-59.9, adult (HCC)    Obstructive sleep apnea    Osteoarthritis of both knees    Pre-diabetes    Sleep apnea    Past Surgical History:  Procedure Laterality Date   ANKLE SURGERY Right    ligament repair   BREAST SURGERY     CATARACT EXTRACTION, BILATERAL Bilateral 2022   COLONOSCOPY  2015   COLONOSCOPY WITH PROPOFOL  N/A 07/26/2023   Procedure: COLONOSCOPY WITH PROPOFOL ;  Surgeon: Unk Corinn Skiff, MD;  Location: [facility] ENDOSCOPY;  Service: Gastroenterology;  Laterality: N/A;   EYE SURGERY     JOINT REPLACEMENT Right    Knee   POLYPECTOMY  07/26/2023   Procedure: POLYPECTOMY;  Surgeon: Unk Corinn Skiff, MD;  Location: [facility] ENDOSCOPY;  Service: Gastroenterology;;   TONSILLECTOMY Bilateral 06/14/2021   Procedure: TONSILLECTOMY;  Surgeon: Milissa Hamming, MD;  Location: [facility] ORS;  Service: ENT;  Laterality: Bilateral;   TOTAL KNEE ARTHROPLASTY Right    TUBAL LIGATION     Patient Active Problem List   Diagnosis Date Noted   Psychophysiological insomnia 02/19/2024   Chronic pain syndrome 02/04/2024   Pharmacologic therapy 02/04/2024   Disorder of skeletal system 02/04/2024   Problems influencing health status 02/04/2024    Diabetes mellitus without complication (HCC) 09/25/2023   Cervical osteophyte 08/23/2023   Oropharyngeal dysphagia 08/23/2023   Chemotherapy-induced neuropathy 08/02/2023   Hx of colonic polyps 07/26/2023   Polyp of ascending colon 07/26/2023   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   Tenesmus (rectal) 06/29/2023   Chronic pain of both lower extremities 06/29/2023   Acute cough 06/29/2023   Need for hepatitis C screening test 06/29/2023   Screening for deficiency anemia 06/29/2023   Encounter to establish care with new doctor 06/29/2023   Neuropathy 06/29/2023   Chemotherapy-induced neutropenia 12/05/2022   Thrombocytopenia 12/05/2022   Lower urinary tract infectious disease 12/03/2022   Myocardial injury 12/03/2022   Obesity, Class III, BMI 40-49.9 (morbid obesity) (HCC) 12/03/2022   Breast cancer (HCC) 12/03/2022   HLD (hyperlipidemia) 12/03/2022   OSA on CPAP 12/03/2022   Hypersensitivity reaction 10/24/2022   Invasive ductal carcinoma of breast, left (HCC) 09/26/2022   Endometrial thickening on ultrasound 07/05/2021   CKD stage G3a/A1, 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) 12/10/2019   Dyspareunia due to medical condition in female 10/24/2019   Postmenopausal bleeding 03/08/2018   Pre-diabetes 11/22/2015   Depression 06/03/2014   Class 3 severe obesity without serious comorbidity with body mass index (BMI) of 50.0 to 59.9 in adult (HCC) 01/07/2014   Severe obesity (HCC) 01/07/2014  Hypertension, benign 10/25/2011   Primary localized osteoarthrosis, lower leg 12/19/2007    ONSET DATE: 3-6 months  REFERRING DIAG: Bilateral hand stiffness and pain mostly 3rd through 5th digits  THERAPY DIAG:  Stiffness of left hand, not elsewhere classified  Stiffness of right hand, not elsewhere classified  Pain in left hand  Pain in right hand  Muscle weakness (generalized)  Rationale for Evaluation and Treatment: Rehabilitation  SUBJECTIVE:   SUBJECTIVE  STATEMENT: I did over the weekend my laundry at the [place].  And my right hand was hurting so bad.  And I could not put my little splint on because I lost it at the laundromat.  Overall my hands and fingers made great progress but  both these ring fingers  Pt accompanied by: self  PERTINENT HISTORY: 01/03/24 Dr Kathlynn visit :  DIAGNOSTIC Upper extremity nerve conduction study: Chronic mild sensory polyneuropathy (11/07/2023) Lower extremity nerve conduction study: Chronic severe sensorimotor polyneuropathy (11/06/2023)    Assessment/Plan:   Assessment & Plan Bilateral hand stiffness and weakness  Chronic bilateral hand stiffness and weakness with difficulty making a fist. Negative for carpal tunnel syndrome and trigger finger. Suspected inflammatory problem affecting tendons, not related to bones or nerves. Possible rheumatologic condition considered, though no joint swelling observed. Diabetes is well-controlled with an A1c of 5.8, and impaired kidney function precludes anti-inflammatory medications. Refer to hand therapy at [facility] outpatient facility on [facility] for therapy twice a week for a month. Instruct to contact [name], the hand therapist, directly for specialized care. Advise to call back if no improvement after a month for re-evaluation. Plan for potential follow-up with a hand specialist, Dr. Ezra, if symptoms persist.  Mild bilateral hand osteoarthritis  Mild osteoarthritis in hand joints, not severe enough to explain current stiffness and weakness. X-rays show minimal arthritis, particularly in the thumb. Diagnoses and all orders for this visit:  Stiffness of joints of both hands - Ambulatory Referral to Occupational Therapy   PRECAUTIONS: 10 pound limit because of cervical surgery about 3 months ago   WEIGHT BEARING RESTRICTIONS: No  PAIN:  Are you having pain? L4th digit 5/10 -  R 4th A1 pulley 7/10 FALLS: Has patient fallen in last 6 months?  No  LIVING ENVIRONMENT: Lives with: lives with their family   PLOF: Patient on disability.  Patient had breast cancer treatment and then had about 3 months ago cervical surgery.  Patient perform her own ADLs and helps at home with some cleaning some cooking and laundry.  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  PATIENT GOALS: I want the stiffness and the pain better so I can use my hands doing laundry, cooking, bathing, driving.    OBJECTIVE:  Note: Objective measures were completed at Evaluation unless otherwise noted.  HAND DOMINANCE: Left  ADLs: Unable to grasp to cook or laundry or cleaning cannot make a composite fist with increased pain and stiffness in the palm, fingers and forearm-difficulty with bathing and dressing and driving to  FUNCTIONAL OUTCOME MEASURES: To do next session UPPER EXTREMITY ROM:     Active ROM Right eval Left eval  Shoulder flexion    Shoulder abduction    Shoulder adduction    Shoulder extension    Shoulder internal rotation    Shoulder external rotation    Elbow flexion    Elbow extension    Wrist flexion    Wrist extension 60 55  Wrist  ulnar deviation    Wrist radial deviation    Wrist pronation    Wrist supination    (Blank rows = not tested)  Active ROM Right eval Left eval R/L 01/14/24 R/L 01/24/24  Thumb MCP (0-60)      Thumb IP (0-80)      Thumb Radial abd/add (0-55)       Thumb Palmar abd/add (0-45)       Thumb Opposition to Small Finger       Index MCP (0-90) 65 75  70/75 60/75  Index PIP (0-100) 95  90 100/95 100/100  Index DIP (0-70)        Long MCP (0-90) 70  80  85/90 90/90  Long PIP (0-100)  90 90  100/90 100/95  Long DIP (0-70)        Ring MCP (0-90) 75  90  85/90 85/90  Ring PIP (0-100)  90  90 100/90 100/95  Ring DIP (0-70)        Little MCP (0-90)  80  90 85/90 90/90  Little PIP (0-100)  90 85  95/90 100/95  Little DIP (0-70)        (Blank rows = not  tested) Patient with increased pain in the left more than the right with attempts of composite fist.  HAND FUNCTION:  Grip strength: Right: 26 lbs; Left: 24 lbs, Lateral pinch: Right: 12 lbs, Left: 6 lbs, and 3 point pinch: Right: 9 lbs, Left: 5 lbs 01/14/24  Grip strength: Right: 35 lbs; Left: 24 lbs, Lateral pinch: Right: 12 lbs, Left: 12 lbs, and 3 point pinch: Right: 9 lbs, Left: 5 lbs 01/24/24  Grip strength: Right: 38 lbs; Left: 26 lbs, Lateral pinch: Right: 14 lbs, Left: 12 lbs, and 3 point pinch: Right: 10 lbs, Left: 8 lbs   COORDINATION: Decreased because of increased stiffness in digits unable to make composite fist.  And pain in digit with 3-point pinch with 2-point pinch  SENSATION: Since chemotherapy patient reports no neuropathy in all digits  EDEMA: Patient denies swelling  COGNITION: Overall cognitive status: Within functional limits for tasks assessed     TREATMENT DATE: 02/25/24        Received verbal order to use iontophoresis with dexamethasone  from Medford Amber, PA of Dr. Kathlynn.on  02/01/24  Patient presented today with increased pain in the right fourth digit after going to the laundromat over the weekend.  As well as lost her MC block splint. Patient continues to show progress since start of care and all digits except bilateral fourth digits.  Range of motion improved in PIP/ DIP since start of care but continues to be tender over the A1 pulley at bilateral fourth digits  Patient rife with her MC block splint on left fourth digit. But lost her right.  Fabricated custom MC block splint for right fourth digit to sleep with as well as during the day wearing it with composite flexion activities Educate patient also and using the [facility] block splints to work on gentle passive range of motion to DIP PIP flexion on bilateral fourth digits pain-free Reviewed with patient donning and doffing and wearing  Modalities: Contrast  Time: 8 minutes Location: Bilateral hands and wrist Decrease edema and pain as well as stiffness in bilateral fourth digits and index fingers   patient continues to do 3 times a day home exercises prior Soft tissue mobilization over red foam roller for soft tissue mobilization over  palm pain-free as well as volar forearms 20 reps each      Followed by tendon glides with focus on metacarpal flexion active assisted if needed 10 reps Gentle passive range of motion on the right 4 intrinsic a fist pain-free with a focus pain-free on the fourth To tolerate DIP PIP flexion on the right fourth   Continue with wrist flexion extension.   Patient to enlarge her grips on her utensils as well as her toothbrush.  As well as not over grip steering wheel or when doing household activities. Patient also to use larger joints like her palm and forearms to carry and lift objects.   Iontophoresis with dexamethasone  medium patch over bilateral fourth A1 pulleys 2.0 current at 20 minutes-skin check done prior patient tolerated well can keep patches on for about an hour after leaving.   PATIENT EDUCATION: Education details: findings of eval and HEP  Person educated: Patient Education method: Explanation, Demonstration, Tactile cues, Verbal cues, and Handouts Education comprehension: verbalized understanding, returned demonstration, verbal cues required, and needs further education    GOALS: Goals reviewed with patient? Yes    LONG TERM GOALS: Target date: 6 weeks  Patient to be independent in home program to decrease pain with home exercises to less than 5/10 to be able to increase motion Baseline: Pain 9/10 with decreased MCP flexion on the right 65-80 and PIP is 90-95.  Left MC 75-90 and PIP is 90 to 85 degrees. Goal status: INITIAL  2.  Bilateral digits flexion improved for patient to be able to touch palm with pain less than  2/10 with cutting food, brushing her teeth and driving. Baseline: Patient limited in flexion of all digits with increased pain over the metacarpals as well as the volar palm and forearm.  With increased pain to 9/10 with use Goal status: INITIAL  3.  Patient's pain in bilateral metacarpals as well as palm of forearm decreased to 2/10 to improve active range of motion of bilateral hands and wrist within functional limits Baseline: Pain 9/10 with decreased motion and decreased strength Goal status: INITIAL  4.  Bilateral grip and prehension strength improved to within normal range for her age to be able to improve her functional use in cooking and laundry symptom free Baseline: Right grip 26 pounds and left 24 pounds with pain.  Lateral pinch 12 pounds on the right and left 6 pounds and 3 point pinch 9 on the right left 5 pounds.  With increased pain with functional use Goal status: INITIAL  5.   PRWHE score for pain and function increased with more than 15 points Baseline:  PRWHE score to be done next session  Goal status: INITIAL  ASSESSMENT:  CLINICAL IMPRESSION: Patient seen today for occupational therapy evaluation for bilateral hand and finger stiffness.  With increased pain.  Left worse than the right.  Patient is left-handed.  Patient had breast cancer with treatment of chemotherapy and report some neuropathy in all digits.  Patient also had cervical surgery last few months patient with some arthritic changes to bilateral hands.  Patient present to OT evaluation with decreased AROM in all digits with 3rd through 5th the worst.  But patient  also limited in second digit avoiding composite fist but using more lateral and 3-point pinch.  Patient's left hand pain worse than the right but motion wise right worse than the left.  See flowsheets above for motion.  Patient with increased pain over the metacarpals into the palm as well as the volar forearm.  Thumb range of motion within normal limits  and denies any pain.  NOW patient made great progress in 9 visits  in active range of motion and pain in bilateral hands from start of care.  Bt showed increase pain and triggering at bilateral 4th digits -  Did get a verbal order from Medford Amber, [place] of Dr. Kathlynn for iontophoresis with dexamethasone  initiated ionto today 2 times a week to bilateral fourth A1 pulleys.  Patient's pain improved since done 4 sessions of ionto with dexamethasone  in combination of use of  MC block splint for bilateral fourth digits  to sleep with and to avoid composite flexion during the day wearing it with composite gripping activities to decrease pain at A1 pulley of the fourth.  Patient is the only driver in her house taking her daughter to work and grandson to school.  As well as housework irritates the fingers.  Patient also lost one of her MC block splints at the laundromat.  Discussed with patient that her pain and range of motion in her hands improved greatly except the bilateral fourth digits.  And her A1C is doing very good.  Recommend for patient to see hand surgeon Dr. Ezra.  Patient in agreement.  Patient continues to be limited in digit range of motion because of bilateral fourth digit inflammation and pain.  As well as decreased grip and prehension strength with increased pain.  Patient limited in functional use of bilateral hands and ADLs and IADLs.  Patient can benefit from skilled OT services to decrease pain and stiffness and increased motion and strength to return to prior level of function.  PERFORMANCE DEFICITS: in functional skills including ADLs, IADLs, ROM, strength, pain, flexibility, decreased knowledge of use of DME, and UE functional use,   and psychosocial skills including environmental adaptation and routines and behaviors.   IMPAIRMENTS: are limiting patient from ADLs, IADLs, rest and sleep, play, leisure, and social participation.   COMORBIDITIES: has no other co-morbidities that affects  occupational performance. Patient will benefit from skilled OT to address above impairments and improve overall function.  MODIFICATION OR ASSISTANCE TO COMPLETE EVALUATION: No modification of tasks or assist necessary to complete an evaluation.  OT OCCUPATIONAL PROFILE AND HISTORY: Problem focused assessment: Including review of records relating to presenting problem.  CLINICAL DECISION MAKING: LOW - limited treatment options, no task modification necessary  REHAB POTENTIAL: Good for goals  EVALUATION COMPLEXITY: Low      PLAN:  OT FREQUENCY: 1-2x/week  OT DURATION: 6 wks  PLANNED INTERVENTIONS: 97168 OT Re-evaluation, 97535 self care/ADL training, 02889 therapeutic exercise, 97530 therapeutic activity, 97112 neuromuscular re-education, 97140 manual therapy, 97035 ultrasound, 97018 paraffin, 02960 fluidotherapy, 97010 moist heat, 97760 Orthotic Initial, [id number] Orthotic/Prosthetic subsequent, patient/family education, and DME and/or AE instructions    CONSULTED AND AGREED WITH PLAN OF CARE: Patient     Ancel Peters, OTR/L,CLT 02/25/2024, 12:46 PM

## 2024-02-29 NOTE — Addendum Note (Signed)
 Addended by: SHERLON NO T on: 02/29/2024 10:35 AM   Modules accepted: Orders

## 2024-02-29 NOTE — Therapy (Signed)
 OUTPATIENT OCCUPATIONAL THERAPY ORTHO TREATMENT/ PROGRESS NOTE  Patient Name: Leslie Bautista MRN: [id number] DOB:1960/07/09, 63 y.o., female Today's Date: 03/03/2024  PCP: [name] NP REFERRING PROVIDER: Dr Kathlynn  END OF SESSION:  OT End of Session - 03/03/24 0907     Visit Number 10    Number of Visits 16    Date for Recertification  03/27/24    OT Start Time 0905    OT Stop Time 0945    OT Time Calculation (min) 40 min    Activity Tolerance Patient tolerated treatment well    Behavior During Therapy WFL for tasks assessed/performed           Past Medical History:  Diagnosis Date   Allergy    Cancer (HCC)    breast cancer on left side   Cataract    Depression    Diabetes mellitus without complication (HCC)    Hypertension    Morbid obesity with BMI of 50.0-59.9, adult (HCC)    Obstructive sleep apnea    Osteoarthritis of both knees    Pre-diabetes    Sleep apnea    Past Surgical History:  Procedure Laterality Date   ANKLE SURGERY Right    ligament repair   BREAST SURGERY     CATARACT EXTRACTION, BILATERAL Bilateral 2022   COLONOSCOPY  2015   COLONOSCOPY WITH PROPOFOL  N/A 07/26/2023   Procedure: COLONOSCOPY WITH PROPOFOL ;  Surgeon: Unk Corinn Skiff, MD;  Location: [facility] ENDOSCOPY;  Service: Gastroenterology;  Laterality: N/A;   EYE SURGERY     JOINT REPLACEMENT Right    Knee   POLYPECTOMY  07/26/2023   Procedure: POLYPECTOMY;  Surgeon: Unk Corinn Skiff, MD;  Location: [facility];  Service: Gastroenterology;;   TONSILLECTOMY Bilateral 06/14/2021   Procedure: TONSILLECTOMY;  Surgeon: Milissa Hamming, MD;  Location: [facility] ORS;  Service: ENT;  Laterality: Bilateral;   TOTAL KNEE ARTHROPLASTY Right    TUBAL LIGATION     Patient Active Problem List   Diagnosis Date Noted   Psychophysiological insomnia 02/19/2024   Chronic pain syndrome 02/04/2024   Pharmacologic therapy 02/04/2024   Disorder of skeletal system 02/04/2024   Problems influencing health  status 02/04/2024   Diabetes mellitus without complication (HCC) 09/25/2023   Cervical osteophyte 08/23/2023   Oropharyngeal dysphagia 08/23/2023   Chemotherapy-induced neuropathy 08/02/2023   Hx of colonic polyps 07/26/2023   Polyp of ascending colon 07/26/2023   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   Tenesmus (rectal) 06/29/2023   Chronic pain of both lower extremities 06/29/2023   Acute cough 06/29/2023   Need for hepatitis C screening test 06/29/2023   Screening for deficiency anemia 06/29/2023   Encounter to establish care with new doctor 06/29/2023   Neuropathy 06/29/2023   Chemotherapy-induced neutropenia 12/05/2022   Thrombocytopenia 12/05/2022   Lower urinary tract infectious disease 12/03/2022   Myocardial injury 12/03/2022   Obesity, Class III, BMI 40-49.9 (morbid obesity) (HCC) 12/03/2022   Breast cancer (HCC) 12/03/2022   HLD (hyperlipidemia) 12/03/2022   OSA on CPAP 12/03/2022   Hypersensitivity reaction 10/24/2022   Invasive ductal carcinoma of breast, left (HCC) 09/26/2022   Endometrial thickening on ultrasound 07/05/2021   CKD stage G3a/A1, 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) 12/10/2019   Dyspareunia due to medical condition in female 10/24/2019   Postmenopausal bleeding 03/08/2018   Pre-diabetes 11/22/2015   Depression 06/03/2014   Class 3 severe obesity without serious comorbidity with body mass index (BMI) of 50.0 to 59.9 in adult (HCC) 01/07/2014   Severe obesity (  HCC) 01/07/2014   Hypertension, benign 10/25/2011   Primary localized osteoarthrosis, lower leg 12/19/2007    ONSET DATE: 3-6 months  REFERRING DIAG: Bilateral hand stiffness and pain mostly 3rd through 5th digits  THERAPY DIAG:  Stiffness of right hand, not elsewhere classified  Stiffness of left hand, not elsewhere classified  Other disturbances of skin sensation  Rationale for Evaluation and Treatment: Rehabilitation  SUBJECTIVE:   SUBJECTIVE STATEMENT: Pt received  steroid injections 02/28/24 reports she can now make a fist, continues to trigger and drop items while cooking.   Pt accompanied by: self  PERTINENT HISTORY: 01/03/24 Dr Kathlynn visit :  DIAGNOSTIC Upper extremity nerve conduction study: Chronic mild sensory polyneuropathy (11/07/2023) Lower extremity nerve conduction study: Chronic severe sensorimotor polyneuropathy (11/06/2023)    Assessment/Plan:   Assessment & Plan Bilateral hand stiffness and weakness  Chronic bilateral hand stiffness and weakness with difficulty making a fist. Negative for carpal tunnel syndrome and trigger finger. Suspected inflammatory problem affecting tendons, not related to bones or nerves. Possible rheumatologic condition considered, though no joint swelling observed. Diabetes is well-controlled with an A1c of 5.8, and impaired kidney function precludes anti-inflammatory medications. Refer to hand therapy at [facility] outpatient facility on [facility] for therapy twice a week for a month. Instruct to contact [name], the hand therapist, directly for specialized care. Advise to call back if no improvement after a month for re-evaluation. Plan for potential follow-up with a hand specialist, Dr. Ezra, if symptoms persist.  Mild bilateral hand osteoarthritis  Mild osteoarthritis in hand joints, not severe enough to explain current stiffness and weakness. X-rays show minimal arthritis, particularly in the thumb. Diagnoses and all orders for this visit:  Stiffness of joints of both hands - Ambulatory Referral to Occupational Therapy   PRECAUTIONS: 10 pound limit because of cervical surgery about 3 months ago   WEIGHT BEARING RESTRICTIONS: No  PAIN:  Are you having pain? L4th and 5th digit 5/10  FALLS: Has patient fallen in last 6 months? No  LIVING ENVIRONMENT: Lives with: lives with their family   PLOF: Patient on disability.  Patient had breast cancer treatment and then had about 3 months ago cervical  surgery.  Patient perform her own ADLs and helps at home with some cleaning some cooking and laundry.  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  PATIENT GOALS: I want the stiffness and the pain better so I can use my hands doing laundry, cooking, bathing, driving.    OBJECTIVE:  Note: Objective measures were completed at Evaluation unless otherwise noted.  HAND DOMINANCE: Left  ADLs: Unable to grasp to cook or laundry or cleaning cannot make a composite fist with increased pain and stiffness in the palm, fingers and forearm-difficulty with bathing and dressing and driving to  FUNCTIONAL OUTCOME MEASURES: To do next session UPPER EXTREMITY ROM:     Active ROM Right eval Right 03/03/24 Left eval Left 03/03/24  Shoulder flexion      Shoulder abduction      Shoulder adduction      Shoulder extension      Shoulder internal rotation      Shoulder external rotation      Elbow flexion      Elbow extension      Wrist flexion      Wrist extension 60 60 55 55  Wrist ulnar deviation      Wrist radial deviation  Wrist pronation      Wrist supination      (Blank rows = not tested)  Active ROM Right eval Left eval R/L 01/14/24 R/L 01/24/24 R/L  03/03/24  Thumb MCP (0-60)       Thumb IP (0-80)       Thumb Radial abd/add (0-55)        Thumb Palmar abd/add (0-45)        Thumb Opposition to Small Finger        Index MCP (0-90) 65 75  70/75 60/75 65/75   Index PIP (0-100) 95  90 100/95 100/100 100/95  Index DIP (0-70)         Long MCP (0-90) 70  80  85/90 90/90 95/95   Long PIP (0-100)  90 90  100/90 100/95 100/100  Long DIP (0-70)         Ring MCP (0-90) 75  90  85/90 85/90 88/90   Ring PIP (0-100)  90  90 100/90 100/95 100/105  Ring DIP (0-70)         Little MCP (0-90)  80  90 85/90 90/90 95/95   Little PIP (0-100)  90 85  95/90 100/95 105/105  Little DIP (0-70)         (Blank rows = not tested) Patient with increased pain  in the left more than the right with attempts of composite fist.  HAND FUNCTION:  Grip strength: Right: 26 lbs; Left: 24 lbs, Lateral pinch: Right: 12 lbs, Left: 6 lbs, and 3 point pinch: Right: 9 lbs, Left: 5 lbs 01/14/24  Grip strength: Right: 35 lbs; Left: 24 lbs, Lateral pinch: Right: 12 lbs, Left: 12 lbs, and 3 point pinch: Right: 9 lbs, Left: 5 lbs 01/24/24  Grip strength: Right: 38 lbs; Left: 26 lbs, Lateral pinch: Right: 14 lbs, Left: 12 lbs, and 3 point pinch: Right: 10 lbs, Left: 8 lbs 03/03/24  Grip strength: Right: 40 lbs; Left: 40 lbs, Lateral pinch: Right: 14 lbs, Left: 10 lbs, and 3 point pinch: Right: 10 lbs, Left: 8 lbs  COORDINATION: Decreased because of increased stiffness in digits unable to make composite fist.  And pain in digit with 3-point pinch with 2-point pinch  SENSATION: Since chemotherapy patient reports no neuropathy in all digits  EDEMA: Patient denies swelling  COGNITION: Overall cognitive status: Within functional limits for tasks assessed     TREATMENT DATE: 03/03/24        Received verbal order to use iontophoresis with dexamethasone  from Medford Amber, PA of Dr. [email or web address].on  02/01/24. Unable to complete this session as pt had to leave early to drive her daughter to work.   Pt reports she did not wear MC block splint over weekend as she wanted to see how the injections helped, plan to resume wearing.  Patient continues to show progress since start of care and all digits except bilateral fourth digits.  Range of motion improved in PIP/ DIP since start of care but continues to be tender over the A1 pulley at bilateral fourth digits Educate patient also and using the [facility] block splints to work on gentle passive range of motion to DIP PIP flexion on bilateral fourth digits pain-free Improved to making fist with no triggering noted.  A1 pulley stretch, educated on completing at home with HEP  Modalities: Contrast  Time: 8 minutes Location: Bilateral hands and wrist Decrease edema and pain as well as stiffness in bilateral fourth digits and index fingers  Followed by tendon glides with focus on metacarpal flexion active assisted if needed 10 reps Gentle passive range of motion on the right 4 intrinsic a fist pain-free with a focus pain-free on the fourth    PATIENT EDUCATION: Education details: findings of eval and HEP  Person educated: Patient Education method: Explanation, Demonstration, Tactile cues, Verbal cues, and Handouts Education comprehension: verbalized understanding, returned demonstration, verbal cues required, and needs further education    GOALS: Goals reviewed with patient? Yes    LONG TERM GOALS: Target date: 6 weeks  Patient to be independent in home program to decrease pain with home exercises to less than 5/10 to be able to increase motion Baseline: Pain 9/10 with decreased MCP flexion on the right 65-80 and PIP is 90-95.  Left MC 75-90 and PIP is 90 to 85 degrees. 03/03/24: pain 5/10 with MCP flexion R 65-95 and PIP 100-105, L MCP 75-95 and PIP 95-105 Goal status: Progressing  2.  Bilateral digits flexion improved for patient to be able to touch palm with pain less than 2/10 with cutting food, brushing her teeth and driving. Baseline: Patient limited in flexion of all digits with increased pain over the metacarpals as well as the volar palm and forearm.  With increased pain to 9/10 with use. 03/03/24: achieves full fist touching palm with 5/10 pain.  Goal status: Progressing  3.  Patient's pain in bilateral metacarpals as well as palm of forearm decreased to 2/10 to improve active range of motion of bilateral hands and wrist within functional limits Baseline: Pain 9/10 with decreased motion and decreased strength. 03/03/24: 5/10 pain.  Goal status: Progressing  4.  Bilateral grip and prehension strength improved to within  normal range for her age to be able to improve her functional use in cooking and laundry symptom free Baseline: Right grip 26 pounds and left 24 pounds with pain.  Lateral pinch 12 pounds on the right and left 6 pounds and 3 point pinch 9 on the right left 5 pounds.  With increased pain with functional use. 03/03/24: R grip L and R 40lbs. Lateral pinch R 14lbs, L 10 lbs.  Goal status: Progressing   ASSESSMENT:  CLINICAL IMPRESSION: Patient seen today for occupational therapy evaluation for bilateral hand and finger stiffness.  With increased pain.  Left worse than the right.  Patient is left-handed.  Patient had breast cancer with treatment of chemotherapy and report some neuropathy in all digits.  Patient also had cervical surgery last few months patient with some arthritic changes to bilateral hands.  Patient present to OT evaluation with decreased AROM in all digits with 3rd through 5th the worst.  But patient also limited in second digit avoiding composite fist but using more lateral and 3-point pinch.  Patient's left hand pain worse than the right but motion wise right worse than the left.  See flowsheets above for motion.  Patient with increased pain over the metacarpals into the palm as well as the volar forearm.  Thumb range of motion within normal limits and denies any pain.  NOW pt making excellent progress in grip strength, dominant L hand improved from 26 to 40 lbs. Achieves full fist and AROM in all MCP and PIP improved, continues to report 5/10 pain at A1 pulley and triggering noted. Pt had to leave session early to drive  her daughter to work, unable to complete [place] this session.  Patient's pain improving over previous 4 sessions of ionto with dexamethasone  in combination of use of  MC block splint for bilateral fourth digits  to sleep with and to avoid composite flexion during the day wearing it with composite gripping activities to decrease pain at A1 pulley of the fourth.  Pt had steroid  injection last week and did not sleep with her MC block splint, plan to resume.  Patient continues to be limited in digit range of motion because of bilateral fourth digit inflammation and pain.  As well as decreased grip and prehension strength with increased pain.  Patient limited in functional use of bilateral hands and ADLs and IADLs.  Patient can benefit from skilled OT services to decrease pain and stiffness and increased motion and strength to return to prior level of function.  PERFORMANCE DEFICITS: in functional skills including ADLs, IADLs, ROM, strength, pain, flexibility, decreased knowledge of use of DME, and UE functional use,   and psychosocial skills including environmental adaptation and routines and behaviors.   IMPAIRMENTS: are limiting patient from ADLs, IADLs, rest and sleep, play, leisure, and social participation.   COMORBIDITIES: has no other co-morbidities that affects occupational performance. Patient will benefit from skilled OT to address above impairments and improve overall function.  MODIFICATION OR ASSISTANCE TO COMPLETE EVALUATION: No modification of tasks or assist necessary to complete an evaluation.  OT OCCUPATIONAL PROFILE AND HISTORY: Problem focused assessment: Including review of records relating to presenting problem.  CLINICAL DECISION MAKING: LOW - limited treatment options, no task modification necessary  REHAB POTENTIAL: Good for goals  EVALUATION COMPLEXITY: Low      PLAN:  OT FREQUENCY: 1-2x/week  OT DURATION: 6 wks  PLANNED INTERVENTIONS: 97168 OT Re-evaluation, 97535 self care/ADL training, 02889 therapeutic exercise, 97530 therapeutic activity, 97112 neuromuscular re-education, 97140 manual therapy, 97035 ultrasound, 97018 paraffin, 02960 fluidotherapy, 97010 moist heat, 97760 Orthotic Initial, [id number] Orthotic/Prosthetic subsequent, patient/family education, and DME and/or AE instructions    CONSULTED AND AGREED WITH PLAN OF CARE:  Patient     Elston JINNY Slot, OTR/L  03/03/2024, 9:11 AM

## 2024-03-03 ENCOUNTER — Encounter: Payer: Self-pay | Admitting: Occupational Therapy

## 2024-03-03 ENCOUNTER — Ambulatory Visit: Admitting: Occupational Therapy

## 2024-03-03 DIAGNOSIS — M25642 Stiffness of left hand, not elsewhere classified: Secondary | ICD-10-CM

## 2024-03-03 DIAGNOSIS — M25641 Stiffness of right hand, not elsewhere classified: Secondary | ICD-10-CM

## 2024-03-03 DIAGNOSIS — R208 Other disturbances of skin sensation: Secondary | ICD-10-CM

## 2024-03-04 ENCOUNTER — Other Ambulatory Visit: Payer: Self-pay | Admitting: Family Medicine

## 2024-03-04 DIAGNOSIS — I10 Essential (primary) hypertension: Secondary | ICD-10-CM

## 2024-03-09 NOTE — Patient Instructions (Incomplete)
 ______________________________________________________________________    New Patients  Welcome to [facility] at [facility].   Initial Visit The first or initial visit consists of an evaluation only.   Interventional pain management.  This is an interventional pain management medical practice. This means that we offer therapies other than prescription controlled substances to manage chronic pain. These therapies may include, but are not limited to, diagnostic, therapeutic, and palliative specialized injection therapies (i.e.: Epidural Steroids, Facet Blocks, etc.). We specialize in a variety of nerve blocks as well as radiofrequency treatments. We offer pain implant evaluations and trials, as well as follow up management. In addition we also provide a variety joint injections, including Viscosupplementation (AKA: Gel Therapy).  Prescription Pain Medication. We specialize in alternatives to opioids.  Although we can provide evaluations and recommendations for/of pharmacologic therapies based on CDC Guidelines, we no longer take patients for long-term medication management. Please be clear that we will not be taking over the prescribing of your pain medications.  ______________________________________________________________________      ______________________________________________________________________    Patient Information update  To: All of our patients.  Re: Name change.  It has been made official that our current name, "[facility]"   will soon be changed to "[facility]".   The purpose of this change is to eliminate any confusion created by the concept of our practice being a "Medication Management Pain Clinic". In the past this has led to the misconception that we treat pain primarily by the use of prescription  medications.  Nothing can be farther from the truth.   Understanding PAIN MANAGEMENT: To further understand what our practice does, you first have to understand that "Pain Management" is a subspecialty that requires additional training once a physician has completed their specialty training, which can be in either Anesthesia, Neurology, Psychiatry, or Physical Medicine and Rehabilitation (PMR). Each one of these contributes to the final approach taken by each physician to the management of their patient's pain. To be a "Pain Management Specialist" you must have first completed one of the specialty trainings below.  Anesthesiologists - trained in clinical pharmacology and interventional techniques such as nerve blockade and regional as well as central neuroanatomy. They are trained to block pain before, during, and after surgical interventions.  Neurologists - trained in the diagnosis and pharmacological treatment of complex neurological conditions, such as Multiple Sclerosis, Parkinson's, spinal cord injuries, and other systemic conditions that may be associated with symptoms that may include but are not limited to pain. They tend to rely primarily on the treatment of chronic pain using prescription medications.  Psychiatrist - trained in conditions affecting the psychosocial wellbeing of patients including but not limited to depression, anxiety, schizophrenia, personality disorders, addiction, and other substance use disorders that may be associated with chronic pain. They tend to rely primarily on the treatment of chronic pain using prescription medications.   Physical Medicine and Rehabilitation (PMR) physicians, also known as physiatrists - trained to treat a wide variety of medical conditions affecting the brain, spinal cord, nerves, bones, joints, ligaments, muscles, and tendons. Their training is primarily aimed at treating patients that have suffered injuries that have caused severe physical  impairment. Their training is primarily aimed at the physical therapy and rehabilitation of those patients. They may also work alongside orthopedic surgeons or neurosurgeons using their expertise in assisting surgical patients to recover after their surgeries.  INTERVENTIONAL PAIN MANAGEMENT is  sub-subspecialty of Pain Management.  Our physicians are Board-certified in Anesthesia, Pain Management, and Interventional Pain Management.  This meaning that not only have they been trained and Board-certified in their specialty of Anesthesia, and subspecialty of Pain Management, but they have also received further training in the sub-subspecialty of Interventional Pain Management, in order to become Board-certified as INTERVENTIONAL PAIN MANAGEMENT SPECIALIST.    Mission: Our goal is to use our skills in  INTERVENTIONAL PAIN MANAGEMENT as alternatives to the chronic use of prescription opioid medications for the treatment of pain. To make this more clear, we have changed our name to reflect what we do and offer. We will continue to offer medication management assessment and recommendations, but we will not be taking over any patient's medication management.  ______________________________________________________________________       ______________________________________________________________________    Appointment Information  It is our goal and responsibility to provide the medical community with assistance in the evaluation and management of patients with chronic pain. Unfortunately our resources are limited. Because we do not have an unlimited amount of time, or available appointments, we are required to closely monitor for unkept or cancelled appointments.  Patient's responsibilities: 1. Punctuality: Patients are required to be physically present in our office at least 15 minutes before their scheduled appointment. 2. Tardiness: Patients not physically present in our office at their scheduled  appointment time will be rescheduled. 3. Plan ahead: Assume that you will encounter traffic and plan to arrive 30 minutes before your appointment. 4. Other appointments and responsibilities: Do not schedule other appointments immediately before or after your scheduled appointment.  5. Be prepared: Make a list of everything that you need to discuss with your provider so that you use your time efficiently. Once the provider leaves your room, he/she will not return to your room to discuss anything that you neglected to bring up during your allowed time. 6. No children or pets: Do not bring children or pets to your appointment. 7. Cancelling or rescheduling your appointment: Advanced notification (more than 24 hours in advance) is required. 8. No Show: Not calling to cancel an appointment and simply not showing up is unacceptable. This leads to loss of appointments that could have been used by a patient in need. (See below)  Corrective process for repeat offenders:  No Shows: Three (3) No Shows within a 12 month period will result in an automatic discharge from our practice. Rescheduling or cancelling with more than 24 hours notice will not be penalized and will not count against you. Tardiness: If you have to be rescheduled three (3) times due to late arrivals, it will be counted as one (1) No Show. Cancellation or reschedule: Three (3) cancellations or rescheduling where notice was given with less than 24 hours in advance, will be recorded as one (1) No Show.  Types of appointments: New patient initial evaluation: These are evaluations only. Your initial patient questionnaire will be collected and entered into the system. A history of present illness will be taken. Prior lab work, imaging studies, and associated treatments will be reviewed. The provider may order appropriate diagnostic testing depending on their evaluation and review of available information. No treatments will be started on  this visit. 2nd Follow-up visit: During this visit your provider will inform you of the results of the diagnostic tests ordered on the initial evaluation. Based on the providers assessment, treatment options will be offered, at which the patient will decide if he/she is interested in the alternatives. If  interested, a treatment plan will be established and started. Procedure visits: Post-procedure evaluation visits: Evaluation visits MM New problems Flare-up evaluations Follow-up after diagnostic testing ______________________________________________________________________     ______________________________________________________________________    Patient information on: Body mass index (BMI) and Weight Management  Dear Ms. Betzler you are receiving this information because your weight may be adversely affecting your health.   Your current Estimated body mass index is 48.09 kg/m as calculated from the following:   Height as of 02/15/24: 5' 3.5 (1.613 m).   Weight as of 02/15/24: 275 lb 12.8 oz (125.1 kg).  We recommend you talk to your primary care physician about providing or referring you to a supervised weight management program.  Here is some information about weight and the body mass index (BMI) classification:  BMI is a measure of obesity that's calculated by dividing a person's weight in kilograms by their height in meters squared. A person can use an online calculator to determine their BMI. Body mass index (BMI) is a common tool for deciding whether a person has an appropriate body weight.  It measures a person's weight in relation to their height.  According to the [facility] of health (NIH): A BMI of less than 18.5 means that a person is underweight. A BMI of between 18.5 and 24.9 is ideal. A BMI of between 25 and 29.9 is overweight. A BMI over 30 indicates obesity.  Body Mass Index (BMI) Classification BMI level (kg/m2) Category Associated incidence of chronic  pain  <18  Underweight   18.5-24.9 Ideal body weight   25-29.9 Overweight  20%  30-34.9 Obese (Class I)  68%  35-39.9 Severe obesity (Class II)  136%  >40 Extreme obesity (Class III)  254%    Morbidly Obese Classification: You will be considered to be Morbidly Obese if your BMI is above 30 and you have one or more of the following conditions caused or associated to obesity: 1.    Type 2 Diabetes (Leading to cardiovascular diseases (CVD), stroke, peripheral vascular diseases (PVD), retinopathy, nephropathy, and neuropathy) 2.    Cardiovascular Disease (High Blood Pressure; Congestive Heart Failure; High Cholesterol; Coronary Artery Disease; Angina; Arrhythmias, Dysrhythmias, or Heart Attacks) 3.    Breathing problems (Asthma; obesity-hypoventilation syndrome; obstructive sleep apnea; chronic inflammatory airway disease; reactive airway disease; or shortness of breath) 4.    Chronic kidney disease 5.    Liver disease (nonalcoholic fatty liver disease) 6.    High blood pressure 7.    Acid reflux (gastroesophageal reflux disease; heartburn) 8.    Osteoarthritis (OA) (affecting the hip(s), the knee(s) and/or the lower back) (usually requiring knee and/or hip replacements, as well as back surgeries) 9.    Low back pain (Lumbar Facet Syndrome; and/or Degenerative Disc Disease) 10.  Hip pain (Osteoarthritis of hip) (For every 1 lbs of added body weight, there is a 2 lbs increase in pressure inside of each hip articulation. 1:2 mechanical relationship) 11.  Knee pain (Osteoarthritis of knee) (For every 1 lbs of added body weight, there is a 4 lbs increase in pressure inside of each knee articulation. 1:4 mechanical relationship) (patients with a BMI>30 kg/m2 were 6.8 times more likely to develop knee OA than normal-weight individuals) 12.  Cancer: Epidemiological studies have shown that obesity is a risk factor for: post-menopausal breast cancer; cancers of the endometrium, colon and kidney cancer;  malignant adenomas of the esophagus. Obese subjects have an approximately 1.5-3.5-fold increased risk of developing these cancers compared with normal-weight subjects, and  it has been estimated that between 15 and 45% of these cancers can be attributed to overweight. More recent studies suggest that obesity may also increase the risk of other types of cancer, including pancreatic, hepatic and gallbladder cancer. (Ref: Obesity and cancer. Pischon T, Nthlings U, [employer] H. Proc Nutr Soc. 2008 May;67(2):128-45. doi: 10.1017/S0029665108006976.) The International Agency for Research on Cancer (IARC) has identified 13 cancers associated with overweight and obesity: meningioma, multiple myeloma, adenocarcinoma of the esophagus, and cancers of the thyroid , postmenopausal breast cancer, gallbladder, stomach, liver, pancreas, kidney, ovaries, uterus, colon and rectal (colorectal) cancers. 55 percent of all cancers diagnosed in women and 24 percent of those diagnosed in men are associated with overweight and obesity.  Recommendation: If you have any of the above conditions it is urgent that you take a step back and concentrate in losing weight. Dedicate 100% of your efforts on this task. Nothing else will improve your health more than bringing your weight down and your BMI to less than 30.   Nutritionist and/or supervised weight-management program: We are aware that most chronic pain patients are unable to exercise secondary to their pain. For this reason, you must rely on proper nutrition and diet in order to lose the weight. We recommend you talk to a nutritionist.   Bariatric surgery: A person might be considered a candidate for bariatric surgery if they meet one of the following BMI criteria:  BMI of 40 or higher: This is considered extreme obesity (Class III). BMI of 35-39.9: This is considered obesity, and the person might also have a serious weight-related health condition, such as high blood pressure, type 2  diabetes, or severe sleep apnea  BMI of 30-34.9: This might be considered if the person has serious weight-related health problems and hasn't had substantial weight loss or improvement in co-morbidities through other methods   On your own: A realistic goal is to lose 10% of your body weight over a period of 12 months.  If over a period of six (6) months you have unsuccessfully tried to lose weight, then it is time for you to seek professional help and to enter a medically supervised weight management program, and/or undergo bariatric surgery.   Pain management considerations and possible limitations:  1.    Pharmacological Problems: Be advised that the use of opioid analgesics (oxycodone ; hydrocodone ; morphine ; methadone; codeine ; and all of their derivatives) have been associated with decreased metabolism and weight gain.  For this reason, should we see that you are unable to lose weight while taking these medications, it may become necessary for us  to taper down and indefinitely discontinue them.  2.    Technical Problems: The incidence of successful interventional therapies decreases as the patient's BMI increases. It is much more difficult to accomplish a safe and effective interventional therapy on a patient with a BMI above 35. 3.    Radiation Exposure Problems: The x-rays machine, used to accomplish injection therapies, will automatically increase their x-ray output in order to capture an appropriate bone image. This means that radiation exposure increases exponentially with the patient's BMI. (The higher the BMI, the higher the radiation exposure.) Although the level of radiation used at a given time is still safe to the patient, it is not for the physician and/or assisting staff. Unfortunately, radiation exposure is accumulative. Because physicians and the staff have to do procedures and be exposed on a daily basis, this can result in health problems such as cancer and radiation burns. Radiation  exposure  to the staff is monitored by the radiation batches that they wear. The exposure levels are reported back to the staff on a quarterly basis. Depending on levels of exposure, physicians and staff may be obligated by law to decrease this exposure. This means that they have the right and obligation to refuse providing therapies where they may be overexposed to radiation. For this reason, physicians may decline to offer therapies such as radiofrequency ablation or implants to patients with a BMI above 40. 4.    Current Trends: Be advised that the current trend is to no longer offer certain therapies to patients with a BMI equal to, or above 35, due to increase perioperative risks, increased technical procedural difficulties, and excessive radiation exposure to healthcare personnel.  Last updated: 02/12/2023 ______________________________________________________________________     Capsaicin Topical System What is this medication? CAPSAICIN (cap SAY sin) treats nerve pain. It works by making your skin feel warm or cool, which blocks pain signals going to the brain. This medicine may be used for other purposes; ask your health care provider or pharmacist if you have questions. COMMON BRAND NAME(S): Qutenza What should I tell my care team before I take this medication? They need to know if you have any of these conditions: Have had a heart attack or stroke High blood pressure Large areas of burned or damaged skin An unusual or allergic reaction to capsaicin, hot peppers, other medications, foods, dyes, or preservatives Pregnant or trying to get pregnant Breastfeeding How should I use this medication? This medication is for external use only. It is applied by your care team in a hospital or clinic setting. Talk to your care team about the use of this medication in children. Special care may be needed. Overdosage: If you think you have taken too much of this medicine contact a poison control  center or emergency room at once. NOTE: This medicine is only for you. Do not share this medicine with others. What if I miss a dose? This does not apply. What may interact with this medication? Interactions are not expected. Do not use any other skin products on the affected area without asking your care team. This list may not describe all possible interactions. Give your health care provider a list of all the medicines, herbs, non-prescription drugs, or dietary supplements you use. Also tell them if you smoke, drink alcohol, or use illegal drugs. Some items may interact with your medicine. What should I watch for while using this medication? Your condition will be monitored carefully while you are receiving this medication. Tell your care team if your symptoms do not start to get better or if they get worse. Talk to your care team about how to treat discomfort. You may place a cooling pack from the refrigerator (not the freezer) on the area. Do not place it directly on the skin. Try not to touch the area where the patch was applied. If you do, wash your hands with soap and water right away. Your skin may be sensitive to heat for a few days after treatment. Avoid hot baths or showers, heating pads, and direct sunlight on the treated area. What side effects may I notice from receiving this medication? Side effects that you should report to your care team as soon as possible: Allergic reactions--skin rash, itching, hives, swelling of the face, lips, tongue, or throat Burning, itching, crusting, or peeling of treated skin Increase in blood pressure Numbness, decrease in sense of touch or sensation Side effects that  usually do not require medical attention (report these to your care team if they continue or are bothersome): Mild skin irritation, redness, or dryness This list may not describe all possible side effects. Call your doctor for medical advice about side effects. You may report side  effects to FDA at 1-800-FDA-1088. Where should I keep my medication? This medication is given in a hospital or clinic. It will not be stored at home. NOTE: This sheet is a summary. It may not cover all possible information. If you have questions about this medicine, talk to your doctor, pharmacist, or health care provider.  2024 Elsevier/Gold Standard (2023-04-20 00:00:00)

## 2024-03-09 NOTE — Progress Notes (Unsigned)
 PROVIDER NOTE: Interpretation of information contained herein should be left to medically-trained personnel. Specific patient instructions are provided elsewhere under Patient Instructions section of medical record. This document was created in part using AI and STT-dictation technology, any transcriptional errors that may result from this process are unintentional.  Patient: Leslie Bautista  Service: E/M Encounter  Provider: Eric DELENA Como, MD  DOB: 12-Jul-1960  Delivery: Face-to-face  Specialty: Interventional Pain Management  MRN: [id number]  Setting: Ambulatory outpatient facility  Specialty designation: 09  Type: New Patient  Location: Outpatient office facility  PCP: [name] Curtis DELENA, FNP  DOS: 03/10/2024    Referring Prov.: Cantwell, Sherran BROCKS, PA*   Primary Reason(s) for Visit: Encounter for initial evaluation of one or more chronic problems (new to examiner) potentially causing chronic pain, and posing a threat to normal musculoskeletal function. (Level of risk: High) CC: No chief complaint on file.  HPI  Leslie Bautista is a 63 y.o. year old, female patient, who comes for the first time to our practice referred by Thomasena Sherran BROCKS, PA* for our initial evaluation of her chronic pain. She has Postmenopausal bleeding; Class 3 severe obesity without serious comorbidity with body mass index (BMI) of 50.0 to 59.9 in adult [facility]); Depression; Pre-diabetes; Primary localized osteoarthrosis, lower leg; Dyspareunia due to medical condition in female; Endometrial thickening on ultrasound; Lower urinary tract infectious disease; Myocardial injury; Obesity, Class III, BMI 40-49.9 (morbid obesity) (HCC); Breast cancer (HCC); Hypertension, benign; HLD (hyperlipidemia); OSA on CPAP; Chemotherapy-induced neutropenia; Thrombocytopenia; Rectal vaginal fistula; Tenesmus (rectal); Chronic pain of both lower extremities; Acute cough; Need for hepatitis C screening test; Screening for deficiency anemia; Encounter  to establish care with new doctor; Neuropathy; Hx of colonic polyps; Polyp of ascending colon; CKD stage G3a/A1, 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 Today she comes in for evaluation of her No chief complaint on file.  Pain Assessment: Location:     Radiating:   Onset:   Duration:   Quality:   Severity:  /10 (subjective, self-reported pain score)  Effect on ADL:   Timing:   Modifying factors:   BP:    HR:    Onset and Duration: {Hx; Onset and Duration:210120511} Cause of pain: {Hx; Cause:210120521} Severity: {Pain Severity:210120502} Timing: {Symptoms; Timing:210120501} Aggravating Factors: {Causes; Aggravating pain factors:210120507} Alleviating Factors: {Causes; Alleviating Factors:210120500} Associated Problems: {Hx; Associated problems:210120515} Quality of Pain: {Hx; Symptom quality or Descriptor:210120531} Previous Examinations or Tests: {Hx; Previous examinations or test:210120529} Previous Treatments: {Hx; Previous Treatment:210120503}  Leslie Bautista is being evaluated for possible interventional pain management therapies for the treatment of her chronic pain.  Discussed the use of AI scribe software for clinical note transcription with the patient, who gave verbal consent to proceed.  History of Present Illness            Leslie Bautista has been informed that this initial visit was an evaluation only.  On the follow up appointment I will go over the results, including ordered tests and available interventional therapies. At that time she will have the opportunity to decide  whether to proceed with offered therapies or not. In the event that Leslie Bautista prefers avoiding interventional options, this will conclude our involvement in the case.  Medication management recommendations may  be provided upon request.  Patient informed that diagnostic tests may be ordered to assist in identifying underlying causes, narrow the list of differential diagnoses and aid in determining candidacy for (or contraindications to) planned therapeutic interventions.  Historic Controlled Substance Pharmacotherapy Review PMP and historical list of controlled substances: Gabapentin  300 Mg Capsule ;Gabapentin  600 Mg Tablet ;Hydrocodone -Acetamin 5-325 Mg ;Pregabalin 50 Mg Capsule ;Tramadol  Hcl 50 Mg Table  Most recently prescribed controlled substance(s): Opioid Analgesic: None MME/day: 0 mg/day  Historical Monitoring: The patient  reports no history of drug use. List of prior UDS Testing: Lab Results  Component Value Date   MDMA NONE DETECTED 12/02/2022   COCAINSCRNUR NONE DETECTED 12/02/2022   PCPSCRNUR NONE DETECTED 12/02/2022   THCU NONE DETECTED 12/02/2022   Historical Background Evaluation: [place] PMP: PDMP reviewed during this encounter. Review of the past 20-months conducted.             PMP NARX Score Report:  Narcotic: 200 Sedative: 150 Stimulant: 000 [place] Department of public safety, offender search: Engineer, mining Information) Non-contributory Risk Assessment Profile: Aberrant behavior: None observed or detected today Risk factors for fatal opioid overdose: None identified today PMP NARX Overdose Risk Score: 200 Fatal overdose hazard ratio (HR): Calculation deferred Non-fatal overdose hazard ratio (HR): Calculation deferred Risk of opioid abuse or dependence: 0.7-3.0% with doses <= 36 MME/day and 6.1-26% with doses >= 120 MME/day. Substance use disorder (SUD) risk level: See below Personal History of Substance Abuse (SUD-Substance use disorder):  Alcohol:    Illegal Drugs:    Rx  Drugs:    ORT Risk Level calculation:    ORT Scoring interpretation table:  Score <3 = Low Risk for SUD  Score between 4-7 = Moderate Risk for SUD  Score >8 = High Risk for Opioid Abuse   PHQ-2 Depression Scale:  Total score:    PHQ-2 Scoring interpretation table: (Score and probability of major depressive disorder)  Score 0 = No depression  Score 1 = 15.4% Probability  Score 2 = 21.1% Probability  Score 3 = 38.4% Probability  Score 4 = 45.5% Probability  Score 5 = 56.4% Probability  Score 6 = 78.6% Probability   PHQ-9 Depression Scale:  Total score:    PHQ-9 Scoring interpretation table:  Score 0-4 = No depression  Score 5-9 = Mild depression  Score 10-14 = Moderate depression  Score 15-19 = Moderately severe depression  Score 20-27 = Severe depression (2.4 times higher risk of SUD and 2.89 times higher risk of overuse)   Pharmacologic Plan: As per protocol, I have not taken over any controlled substance management, pending the results of ordered tests and/or consults.            Initial impression: Pending review of available data and ordered tests.  Meds   Current Outpatient Medications:    anastrozole (ARIMIDEX) 1 MG tablet, Take 1 mg by mouth daily., Disp: , Rfl:    atorvastatin  (LIPITOR) 20 MG tablet, Take 20 mg by mouth at bedtime., Disp: , Rfl:    cyclobenzaprine  (FLEXERIL ) 10 MG tablet, Take 1 tablet (10 mg total) by mouth 3 (three) times daily as needed for muscle spasms., Disp: 20 tablet, Rfl: 0   DULoxetine (CYMBALTA) 30 MG capsule, Take 30 mg by mouth daily., Disp: , Rfl:    gabapentin  (NEURONTIN ) 300 MG capsule, Take one 300mg  capsule in the morning and at night take one 300 mg tablet plus 600 tablet., Disp: 180 capsule, Rfl: 2   gabapentin  (NEURONTIN ) 600 MG  tablet, Take 600 mg by mouth 2 (two) times daily., Disp: , Rfl:    losartan -hydrochlorothiazide (HYZAAR) 50-12.5 MG tablet, TAKE 1 TABLET BY MOUTH EVERY DAY, Disp: 90 tablet, Rfl: 1   meloxicam  (MOBIC ) 7.5  MG tablet, Take 7.5 mg by mouth 2 (two) times daily., Disp: , Rfl:    nortriptyline (PAMELOR) 10 MG capsule, Take 20 mg by mouth at bedtime., Disp: , Rfl:    omeprazole (PRILOSEC) 20 MG capsule, Take 20 mg by mouth every morning., Disp: , Rfl:    Semaglutide  (RYBELSUS ) 3 MG TABS, Take 1 tablet (3 mg total) by mouth daily., Disp: 90 tablet, Rfl: 1   traZODone  (DESYREL ) 50 MG tablet, Take 1 tablet (50 mg total) by mouth at bedtime., Disp: 90 tablet, Rfl: 1  Imaging Review  Hip Imaging: Hip-R DG 2-3 views: Results for orders placed during the hospital encounter of 11/16/23 DG HIP UNILAT W OR W/O PELVIS 2-3 VIEWS RIGHT  Narrative CLINICAL DATA:  Hip pain  EXAM: DG HIP (WITH OR WITHOUT PELVIS) 2-3V RIGHT  COMPARISON:  None Available.  FINDINGS: Mild SI joint degenerative change. Pubic symphysis and rami appear intact. No acute fracture or malalignment. Joint space appears patent.  IMPRESSION: No acute osseous abnormality   Electronically Signed By: Luke Bun M.D. On: 11/23/2023 20:11  Ankle Imaging: Ankle-R DG Complete: Results for orders placed during the hospital encounter of 02/23/17 DG Ankle Complete Right  Narrative CLINICAL DATA:  Clemens several weeks ago. Persistent pain with weight-bearing.  EXAM: RIGHT ANKLE - COMPLETE 3+ VIEW  COMPARISON:  None.  FINDINGS: Somewhat unusual linear calcification projected just at the anterior edge of the ankle joint on the lateral view, possibly relates to the tibial fibular articulation. I suspect of this is chronic but cannot rule out the possibility of an avulsion fracture. Medial and lateral malleolus do not show recent injury. There are prominent calcaneal spurs.  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 Less likely avulsion fracture. Is the patient tender in that  location?   Electronically Signed By: Oneil Officer M.D. On: 02/23/2017 11:58  Foot Imaging: Foot-R DG Complete: Results for orders placed in visit on 09/19/21 DG Foot Complete Right  Narrative Please see detailed radiograph report in office note.  Foot-L DG Complete: Results for orders placed in visit on 07/24/18 DG Foot Complete Left  Narrative Please see detailed radiograph report in office note.  Complexity Note: Imaging results reviewed.                         ROS  Cardiovascular: {Hx; Cardiovascular History:210120525} Pulmonary or Respiratory: {Hx; Pumonary and/or Respiratory History:210120523} Neurological: {Hx; Neurological:210120504} Psychological-Psychiatric: {Hx; Psychological-Psychiatric History:210120512} Gastrointestinal: {Hx; Gastrointestinal:210120527} Genitourinary: {Hx; Genitourinary:210120506} Hematological: {Hx; Hematological:210120510} Endocrine: {Hx; Endocrine history:210120509} Rheumatologic: {Hx; Rheumatological:210120530} Musculoskeletal: {Hx; Musculoskeletal:210120528} Work History: {Hx; Work history:210120514}  Allergies  Leslie Bautista is allergic to metformin  and related and oxycontin  [oxycodone  hcl].  Laboratory Chemistry Profile   Renal Lab Results  Component Value Date   BUN 15 11/15/2023   CREATININE 1.20 (H) 11/15/2023   BCR 13 11/15/2023   GFRAA 48 (L) 10/22/2018   GFRNONAA 58 (L) 08/16/2023   PROTEINUR NEGATIVE 08/16/2023     Electrolytes Lab Results  Component Value Date   NA 142 11/15/2023   K 3.9 11/15/2023   CL 101 11/15/2023   CALCIUM  9.3 11/15/2023   MG  2.0 12/05/2022   PHOS 3.3 12/05/2022     Hepatic Lab Results  Component Value Date   AST 16 11/15/2023   ALT 9 11/15/2023   ALBUMIN 3.8 (L) 11/15/2023   ALKPHOS 119 11/15/2023   LIPASE 22 08/16/2023     ID Lab Results  Component Value Date   HIV Non Reactive 12/03/2022   SARSCOV2NAA NEGATIVE 07/19/2023     Bone No results found for: VD25OH,  [id number], CI6874NY7, CI7874NY7, 25OHVITD1, 25OHVITD2, 25OHVITD3, TESTOFREE, TESTOSTERONE   Endocrine Lab Results  Component Value Date   GLUCOSE 93 11/15/2023   GLUCOSEU NEGATIVE 08/16/2023   HGBA1C 5.7 (A) 02/15/2024     Neuropathy Lab Results  Component Value Date   HGBA1C 5.7 (A) 02/15/2024   HIV Non Reactive 12/03/2022     CNS No results found for: COLORCSF, APPEARCSF, RBCCOUNTCSF, WBCCSF, POLYSCSF, LYMPHSCSF, EOSCSF, PROTEINCSF, GLUCCSF, JCVIRUS, CSFOLI, IGGCSF, LABACHR, ACETBL   Inflammation (CRP: Acute  ESR: Chronic) No results found for: CRP, ESRSEDRATE, LATICACIDVEN   Rheumatology No results found for: RF, ANA, LABURIC, URICUR, LYMEIGGIGMAB, LYMEABIGMQN, HLAB27   Coagulation Lab Results  Component Value Date   INR 1.2 12/04/2021   LABPROT 14.6 12/04/2021   APTT 27 12/04/2021   PLT 171 08/16/2023     Cardiovascular Lab Results  Component Value Date   CKTOTAL 97 06/21/2014   CKMB 0.8 06/21/2014   TROPONINI < 0.02 06/21/2014   HGB 11.8 (L) 08/16/2023   HCT 36.6 08/16/2023     Screening Lab Results  Component Value Date   SARSCOV2NAA NEGATIVE 07/19/2023   HIV Non Reactive 12/03/2022     Cancer No results found for: CEA, CA125, LABCA2   Allergens No results found for: ALMOND, APPLE, ASPARAGUS, AVOCADO, BANANA, BARLEY, BASIL, BAYLEAF, GREENBEAN, LIMABEAN, WHITEBEAN, BEEFIGE, REDBEET, BLUEBERRY, BROCCOLI, CABBAGE, MELON, CARROT, CASEIN, CASHEWNUT, CAULIFLOWER, CELERY     Note: Lab results reviewed.  PFSH  Drug: Leslie Bautista  reports no history of drug use. Alcohol:  reports that she does not currently use alcohol. Tobacco:  reports that she has never smoked. She has never used smokeless tobacco. Medical:  has a past medical history of Allergy, Cancer (HCC), Cataract, Depression, Diabetes mellitus without complication (HCC), Hypertension,  Morbid obesity with BMI of 50.0-59.9, adult (HCC), Obstructive sleep apnea, Osteoarthritis of both knees, Pre-diabetes, and Sleep apnea. Family: family history includes Hypertension in her brother and sister; Pancreatic cancer in her mother; Prostate cancer in her father.  Past Surgical History:  Procedure Laterality Date   ANKLE SURGERY Right    ligament repair   BREAST SURGERY     CATARACT EXTRACTION, BILATERAL Bilateral 2022   COLONOSCOPY  2015   COLONOSCOPY WITH PROPOFOL  N/A 07/26/2023   Procedure: COLONOSCOPY WITH PROPOFOL ;  Surgeon: Unk Corinn Skiff, MD;  Location: [facility] ENDOSCOPY;  Service: Gastroenterology;  Laterality: N/A;   EYE SURGERY     JOINT REPLACEMENT Right    Knee   POLYPECTOMY  07/26/2023   Procedure: POLYPECTOMY;  Surgeon: Unk Corinn Skiff, MD;  Location: [facility] ENDOSCOPY;  Service: Gastroenterology;;   TONSILLECTOMY Bilateral 06/14/2021   Procedure: TONSILLECTOMY;  Surgeon: Milissa Hamming, MD;  Location: [facility] ORS;  Service: ENT;  Laterality: Bilateral;   TOTAL KNEE ARTHROPLASTY Right    TUBAL LIGATION     Active Ambulatory Problems    Diagnosis Date Noted   Postmenopausal bleeding 03/08/2018   Class 3 severe obesity without serious comorbidity with body mass index (BMI) of 50.0 to 59.9 in adult [facility]) 01/07/2014   Depression 06/03/2014  Pre-diabetes 11/22/2015   Primary localized osteoarthrosis, lower leg 12/19/2007   Dyspareunia due to medical condition in female 10/24/2019   Endometrial thickening on ultrasound 07/05/2021   Lower urinary tract infectious disease 12/03/2022   Myocardial injury 12/03/2022   Obesity, Class III, BMI 40-49.9 (morbid obesity) (HCC) 12/03/2022   Breast cancer (HCC) 12/03/2022   Hypertension, benign 10/25/2011   HLD (hyperlipidemia) 12/03/2022   OSA on CPAP 12/03/2022   Chemotherapy-induced neutropenia 12/05/2022   Thrombocytopenia 12/05/2022   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   Tenesmus (rectal) 06/29/2023   Chronic pain  of both lower extremities 06/29/2023   Acute cough 06/29/2023   Need for hepatitis C screening test 06/29/2023   Screening for deficiency anemia 06/29/2023   Encounter to establish care with new doctor 06/29/2023   Neuropathy 06/29/2023   Hx of colonic polyps 07/26/2023   Polyp of ascending colon 07/26/2023   CKD stage G3a/A1, 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) 12/10/2019   Hypersensitivity reaction 10/24/2022   Invasive ductal carcinoma of breast, left (HCC) 09/26/2022   Diabetes mellitus without complication (HCC) 09/25/2023   Cervical osteophyte 08/23/2023   Chemotherapy-induced neuropathy 08/02/2023   Oropharyngeal dysphagia 08/23/2023   Severe obesity (HCC) 01/07/2014   Chronic pain syndrome 02/04/2024   Pharmacologic therapy 02/04/2024   Disorder of skeletal system 02/04/2024   Problems influencing health status 02/04/2024   Psychophysiological insomnia 02/19/2024   Resolved Ambulatory Problems    Diagnosis Date Noted   Elevated troponin 12/03/2022   Past Medical History:  Diagnosis Date   Allergy    Cancer (HCC)    Cataract    Hypertension    Morbid obesity with BMI of 50.0-59.9, adult (HCC)    Obstructive sleep apnea    Osteoarthritis of both knees    Sleep apnea    Constitutional Exam  General appearance: Well nourished, well developed, and well hydrated. In no apparent acute distress There were no vitals filed for this visit. BMI Assessment: Estimated body mass index is 48.09 kg/m as calculated from the following:   Height as of 02/15/24: 5' 3.5 (1.613 m).   Weight as of 02/15/24: 275 lb 12.8 oz (125.1 kg).  BMI interpretation table: BMI level Category Range association with higher incidence of chronic pain  <18 kg/m2 Underweight   18.5-24.9 kg/m2 Ideal body weight   25-29.9 kg/m2 Overweight Increased incidence by 20%  30-34.9 kg/m2 Obese (Class I) Increased incidence by 68%  35-39.9 kg/m2 Severe obesity (Class II) Increased incidence by  136%  >40 kg/m2 Extreme obesity (Class III) Increased incidence by 254%   Patient's current BMI Ideal Body weight  There is no height or weight on file to calculate BMI. Patient weight not recorded   BMI Readings from Last 4 Encounters:  02/15/24 48.09 kg/m  11/15/23 47.22 kg/m  09/25/23 49.55 kg/m  08/30/23 50.02 kg/m   Wt Readings from Last 4 Encounters:  02/15/24 275 lb 12.8 oz (125.1 kg)  11/15/23 270 lb 12.8 oz (122.8 kg)  09/25/23 279 lb 11.2 oz (126.9 kg)  08/30/23 282 lb 6.4 oz (128.1 kg)    Psych/Mental status: Alert, oriented x 3 (person, place, & time)       Eyes: PERLA Respiratory: No evidence of acute respiratory distress  Assessment  Primary Diagnosis & Pertinent Problem List: The primary encounter diagnosis was Chronic pain syndrome. Diagnoses of Pharmacologic therapy, Disorder of skeletal system, and Problems influencing health status were also pertinent to this visit.  Visit Diagnosis (New problems to  examiner): 1. Chronic pain syndrome   2. Pharmacologic therapy   3. Disorder of skeletal system   4. Problems influencing health status    Plan of Care (Initial workup plan)  Note: Leslie Bautista was reminded that as per protocol, today's visit has been an evaluation only. We have not taken over the patient's controlled substance management.  Problem-specific plan: Assessment and Plan            Lab Orders  No laboratory test(s) ordered today   Imaging Orders  No imaging studies ordered today   Referral Orders  No referral(s) requested today   Procedure Orders    No procedure(s) ordered today   Pharmacotherapy (current): Medications ordered:  No orders of the defined types were placed in this encounter.  Medications administered during this visit: Leslie Isaiah had no medications administered during this visit.   Analgesic Pharmacotherapy:  Opioid Analgesics: For patients currently taking or requesting to take opioid analgesics, in  accordance with [place]  Medical Board Guidelines, we will assess their risks and indications for the use of these substances. After completing our evaluation, we may offer recommendations, but we no longer take patients for medication management. The prescribing physician will ultimately decide, based on his/her training and level of comfort whether to adopt any of the recommendations, including whether or not to prescribe such medicines.  Membrane stabilizer: To be determined at a later time  Muscle relaxant: To be determined at a later time  NSAID: To be determined at a later time  Other analgesic(s): To be determined at a later time   Interventional management options: Leslie Bautista was informed that there is no guarantee that she would be a candidate for interventional therapies. The decision will be based on the results of diagnostic studies, as well as Leslie Bautista's risk profile.  Procedure(s) under consideration:  Pending results of ordered studies     Interventional Therapies  Risk Factors  Considerations  Medical Comorbidities:  MO (BMI>45)  Hx. Breast CA  Stage 3a CKD  T2NIDDM  HTN  OSA w/ CPAP  Thrombocytopenia     Planned  Pending:      Under consideration:   Pending   Completed: (Analgesic benefit)1  None at this time   Therapeutic  Palliative (PRN) options:   None established   Completed by other providers:   None reported  1(Analgesic benefit): Expressed in percentage (%). (Local anesthetic[LA] +/- sedation  L.A.Local Anesthetic  Steroid benefit  Ongoing benefit)   Provider-requested follow-up: No follow-ups on file.  Future Appointments  Date Time Provider Department Center  03/10/2024  9:00 AM Tanya Glisson, MD [facility] None  03/11/2024  9:00 AM Ancel Peters, OT [facility] None  05/20/2024  9:40 AM Sharma Coyer, MD [facility]  08/06/2024 10:10 AM [facility]-ANNUAL WELLNESS VISIT [facility]   I discussed the  assessment and treatment plan with the patient. The patient was provided an opportunity to ask questions and all were answered. The patient agreed with the plan and demonstrated an understanding of the instructions.  Patient advised to call back or seek an in-person evaluation if the symptoms or condition worsens.  Duration of encounter: *** minutes.  Total time on encounter, as per AMA guidelines included both the face-to-face and non-face-to-face time personally spent by the physician and/or other qualified health care professional(s) on the day of the encounter (includes time in activities that require the physician or other qualified health care professional and does not include time in activities normally performed  by clinical staff). Physician's time may include the following activities when performed: Preparing to see the patient (e.g., pre-charting review of records, searching for previously ordered imaging, lab work, and nerve conduction tests) Review of prior analgesic pharmacotherapies. Reviewing PMP Interpreting ordered tests (e.g., lab work, imaging, nerve conduction tests) Performing post-procedure evaluations, including interpretation of diagnostic procedures Obtaining and/or reviewing separately obtained history Performing a medically appropriate examination and/or evaluation Counseling and educating the patient/family/caregiver Ordering medications, tests, or procedures Referring and communicating with other health care professionals (when not separately reported) Documenting clinical information in the electronic or other health record Independently interpreting results (not separately reported) and communicating results to the patient/ family/caregiver Care coordination (not separately reported)  Note by: Eric DELENA Como, MD (TTS and AI technology used. I apologize for any typographical errors that were not detected and corrected.) Date: 03/10/2024; Time: 11:51 AM

## 2024-03-10 ENCOUNTER — Ambulatory Visit: Attending: Pain Medicine | Admitting: Pain Medicine

## 2024-03-10 ENCOUNTER — Encounter: Payer: Self-pay | Admitting: Pain Medicine

## 2024-03-10 VITALS — BP 134/69 | HR 56 | Temp 97.2°F | Ht 63.0 in | Wt 272.0 lb

## 2024-03-10 DIAGNOSIS — M79642 Pain in left hand: Secondary | ICD-10-CM | POA: Diagnosis present

## 2024-03-10 DIAGNOSIS — R202 Paresthesia of skin: Secondary | ICD-10-CM | POA: Insufficient documentation

## 2024-03-10 DIAGNOSIS — Z789 Other specified health status: Secondary | ICD-10-CM | POA: Insufficient documentation

## 2024-03-10 DIAGNOSIS — R94130 Abnormal response to nerve stimulation, unspecified: Secondary | ICD-10-CM | POA: Diagnosis present

## 2024-03-10 DIAGNOSIS — Z79899 Other long term (current) drug therapy: Secondary | ICD-10-CM | POA: Insufficient documentation

## 2024-03-10 DIAGNOSIS — E1142 Type 2 diabetes mellitus with diabetic polyneuropathy: Secondary | ICD-10-CM | POA: Diagnosis present

## 2024-03-10 DIAGNOSIS — M79671 Pain in right foot: Secondary | ICD-10-CM | POA: Diagnosis present

## 2024-03-10 DIAGNOSIS — R200 Anesthesia of skin: Secondary | ICD-10-CM | POA: Diagnosis present

## 2024-03-10 DIAGNOSIS — M79672 Pain in left foot: Secondary | ICD-10-CM | POA: Diagnosis present

## 2024-03-10 DIAGNOSIS — E119 Type 2 diabetes mellitus without complications: Secondary | ICD-10-CM | POA: Diagnosis present

## 2024-03-10 DIAGNOSIS — M5459 Other low back pain: Secondary | ICD-10-CM | POA: Diagnosis present

## 2024-03-10 DIAGNOSIS — M79641 Pain in right hand: Secondary | ICD-10-CM | POA: Insufficient documentation

## 2024-03-10 DIAGNOSIS — G608 Other hereditary and idiopathic neuropathies: Secondary | ICD-10-CM | POA: Insufficient documentation

## 2024-03-10 DIAGNOSIS — M545 Low back pain, unspecified: Secondary | ICD-10-CM | POA: Diagnosis present

## 2024-03-10 DIAGNOSIS — M79643 Pain in unspecified hand: Secondary | ICD-10-CM | POA: Insufficient documentation

## 2024-03-10 DIAGNOSIS — G629 Polyneuropathy, unspecified: Secondary | ICD-10-CM | POA: Insufficient documentation

## 2024-03-10 DIAGNOSIS — G894 Chronic pain syndrome: Secondary | ICD-10-CM | POA: Insufficient documentation

## 2024-03-10 DIAGNOSIS — G8929 Other chronic pain: Secondary | ICD-10-CM | POA: Insufficient documentation

## 2024-03-10 DIAGNOSIS — M899 Disorder of bone, unspecified: Secondary | ICD-10-CM | POA: Diagnosis present

## 2024-03-10 NOTE — Progress Notes (Signed)
 Safety precautions to be maintained throughout the outpatient stay will include: orient to surroundings, keep bed in low position, maintain call bell within reach at all times, provide assistance with transfer out of bed and ambulation.

## 2024-03-11 ENCOUNTER — Ambulatory Visit: Admitting: Occupational Therapy

## 2024-03-11 DIAGNOSIS — M25642 Stiffness of left hand, not elsewhere classified: Secondary | ICD-10-CM

## 2024-03-11 DIAGNOSIS — M79641 Pain in right hand: Secondary | ICD-10-CM

## 2024-03-11 DIAGNOSIS — R208 Other disturbances of skin sensation: Secondary | ICD-10-CM

## 2024-03-11 DIAGNOSIS — M25641 Stiffness of right hand, not elsewhere classified: Secondary | ICD-10-CM

## 2024-03-11 DIAGNOSIS — M79642 Pain in left hand: Secondary | ICD-10-CM

## 2024-03-11 DIAGNOSIS — M6281 Muscle weakness (generalized): Secondary | ICD-10-CM

## 2024-03-11 NOTE — Therapy (Signed)
 OUTPATIENT OCCUPATIONAL THERAPY ORTHO TREATMENT  Patient Name: Leslie Bautista MRN: [id number] DOB:1960-11-17, 63 y.o., female Today's Date: 03/11/2024  PCP: [name] NP REFERRING PROVIDER: Dr Kathlynn  END OF SESSION:  OT End of Session - 03/11/24 0903     Visit Number 11    Number of Visits 16    Date for Recertification  03/27/24    OT Start Time 0904    OT Stop Time 0945    OT Time Calculation (min) 41 min    Activity Tolerance Patient tolerated treatment well    Behavior During Therapy WFL for tasks assessed/performed           Past Medical History:  Diagnosis Date   Allergy    Cancer (HCC)    breast cancer on left side   Cataract    Depression    Diabetes mellitus without complication (HCC)    Hypertension    Morbid obesity with BMI of 50.0-59.9, adult (HCC)    Obstructive sleep apnea    Osteoarthritis of both knees    Pre-diabetes    Sleep apnea    Past Surgical History:  Procedure Laterality Date   ANKLE SURGERY Right    ligament repair   BREAST SURGERY     CATARACT EXTRACTION, BILATERAL Bilateral 2022   COLONOSCOPY  2015   COLONOSCOPY WITH PROPOFOL  N/A 07/26/2023   Procedure: COLONOSCOPY WITH PROPOFOL ;  Surgeon: Unk Corinn Skiff, MD;  Location: [facility];  Service: Gastroenterology;  Laterality: N/A;   EYE SURGERY     JOINT REPLACEMENT Right    Knee   POLYPECTOMY  07/26/2023   Procedure: POLYPECTOMY;  Surgeon: Unk Corinn Skiff, MD;  Location: [facility];  Service: Gastroenterology;;   TONSILLECTOMY Bilateral 06/14/2021   Procedure: TONSILLECTOMY;  Surgeon: Milissa Hamming, MD;  Location: [facility];  Service: ENT;  Laterality: Bilateral;   TOTAL KNEE ARTHROPLASTY Right    TUBAL LIGATION     Patient Active Problem List   Diagnosis Date Noted   Chronic feet pain (Bilateral) 03/10/2024   Chronic and pain (Bilateral) 03/10/2024   Numbness and tingling in both hands 03/10/2024   Non-articular hand pain (Bilateral) 03/10/2024    Multifactorial peripheral neuropathy 03/10/2024   Sensory peripheral neuropathy 03/10/2024   Abnormal NCS (nerve conduction studies) (11/07/2023) 03/10/2024   Diabetic sensorimotor polyneuropathy (HCC) 03/10/2024   Polyneuropathy, peripheral sensorimotor axonal 03/10/2024   Chronic low back pain (2ry area of Pain) (Right) w/o sciatica 03/10/2024   Lumbar facet joint pain 03/10/2024   Psychophysiological insomnia 02/19/2024   Chronic pain syndrome 02/04/2024   Pharmacologic therapy 02/04/2024   Disorder of skeletal system 02/04/2024   Problems influencing health status 02/04/2024   Diabetes mellitus type II, non insulin dependent (HCC) 09/25/2023   Cervical osteophyte 08/23/2023   Oropharyngeal dysphagia 08/23/2023   Chemotherapy-induced neuropathy 08/02/2023   Hx of colonic polyps 07/26/2023   Polyp of ascending colon 07/26/2023   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   Tenesmus (rectal) 06/29/2023   Chronic lower extremity pain (1ry area of Pain) (Bilateral) 06/29/2023   Acute cough 06/29/2023   Need for hepatitis C screening test 06/29/2023   Screening for deficiency anemia 06/29/2023   Encounter to establish care with new doctor 06/29/2023   Neuropathy 06/29/2023   Chemotherapy-induced neutropenia 12/05/2022   Thrombocytopenia 12/05/2022   Lower urinary tract infectious disease 12/03/2022   Myocardial injury 12/03/2022   Obesity, Class III, BMI 40-49.9 (morbid obesity) (HCC) 12/03/2022   Breast cancer (HCC) 12/03/2022   HLD (hyperlipidemia) 12/03/2022  OSA on CPAP 12/03/2022   Hypersensitivity reaction 10/24/2022   Invasive ductal carcinoma of breast, left (HCC) 09/26/2022   Endometrial thickening on ultrasound 07/05/2021   CKD stage G3a/A1, 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) 12/10/2019   Dyspareunia due to medical condition in female 10/24/2019   Postmenopausal bleeding 03/08/2018   Pre-diabetes 11/22/2015   Depression 06/03/2014   Class 3 severe obesity  without serious comorbidity with body mass index (BMI) of 50.0 to 59.9 in adult (HCC) 01/07/2014   Severe obesity (HCC) 01/07/2014   Hypertension, benign 10/25/2011   Primary localized osteoarthrosis, lower leg 12/19/2007    ONSET DATE: 3-6 months  REFERRING DIAG: Bilateral hand stiffness and pain mostly 3rd through 5th digits  THERAPY DIAG:  Stiffness of right hand, not elsewhere classified  Stiffness of left hand, not elsewhere classified  Other disturbances of skin sensation  Pain in left hand  Pain in right hand  Muscle weakness (generalized)  Rationale for Evaluation and Treatment: Rehabilitation  SUBJECTIVE:   SUBJECTIVE STATEMENT: Pt received steroid injections 02/28/24  by Dr Ezra reports she can now make a fist, continues to trigger in the ring fingers and drop items while cooking.   Pt accompanied by: self  PERTINENT HISTORY: 01/03/24 Dr Kathlynn visit :  DIAGNOSTIC Upper extremity nerve conduction study: Chronic mild sensory polyneuropathy (11/07/2023) Lower extremity nerve conduction study: Chronic severe sensorimotor polyneuropathy (11/06/2023)    Assessment/Plan:   Assessment & Plan Bilateral hand stiffness and weakness  Chronic bilateral hand stiffness and weakness with difficulty making a fist. Negative for carpal tunnel syndrome and trigger finger. Suspected inflammatory problem affecting tendons, not related to bones or nerves. Possible rheumatologic condition considered, though no joint swelling observed. Diabetes is well-controlled with an A1c of 5.8, and impaired kidney function precludes anti-inflammatory medications. Refer to hand therapy at [facility] outpatient facility on [facility] for therapy twice a week for a month. Instruct to contact [name], the hand therapist, directly for specialized care. Advise to call back if no improvement after a month for re-evaluation. Plan for potential follow-up with a hand specialist, Dr. Ezra, if symptoms  persist.  Mild bilateral hand osteoarthritis  Mild osteoarthritis in hand joints, not severe enough to explain current stiffness and weakness. X-rays show minimal arthritis, particularly in the thumb. Diagnoses and all orders for this visit:  Stiffness of joints of both hands - Ambulatory Referral to Occupational Therapy   PRECAUTIONS: 10 pound limit because of cervical surgery about 3 months ago   WEIGHT BEARING RESTRICTIONS: No  PAIN:  Are you having pain? L4th and 5th digit 5/10  FALLS: Has patient fallen in last 6 months? No  LIVING ENVIRONMENT: Lives with: lives with their family   PLOF: Patient on disability.  Patient had breast cancer treatment and then had about 3 months ago cervical surgery.  Patient perform her own ADLs and helps at home with some cleaning some cooking and laundry.  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  PATIENT GOALS: I want the stiffness and the pain better so I can use my hands doing laundry, cooking, bathing, driving.    OBJECTIVE:  Note: Objective measures were completed at Evaluation unless otherwise noted.  HAND DOMINANCE: Left  ADLs: Unable to grasp to cook or laundry or cleaning cannot make a composite fist with increased pain and stiffness in the palm, fingers and forearm-difficulty with bathing and dressing and driving to  FUNCTIONAL OUTCOME MEASURES: To do next session UPPER EXTREMITY ROM:     Active ROM Right eval Right 03/03/24 Left eval Left 03/03/24  Shoulder flexion      Shoulder abduction      Shoulder adduction      Shoulder extension      Shoulder internal rotation      Shoulder external rotation      Elbow flexion      Elbow extension      Wrist flexion      Wrist extension 60 60 55 55  Wrist ulnar deviation      Wrist radial deviation      Wrist pronation      Wrist supination      (Blank rows = not tested)  Active ROM Right eval Left eval R/L 01/14/24  R/L 01/24/24 R/L  03/03/24  Thumb MCP (0-60)       Thumb IP (0-80)       Thumb Radial abd/add (0-55)        Thumb Palmar abd/add (0-45)        Thumb Opposition to Small Finger        Index MCP (0-90) 65 75  70/75 60/75 65/75   Index PIP (0-100) 95  90 100/95 100/100 100/95  Index DIP (0-70)         Long MCP (0-90) 70  80  85/90 90/90 95/95   Long PIP (0-100)  90 90  100/90 100/95 100/100  Long DIP (0-70)         Ring MCP (0-90) 75  90  85/90 85/90 88/90   Ring PIP (0-100)  90  90 100/90 100/95 100/105  Ring DIP (0-70)         Little MCP (0-90)  80  90 85/90 90/90 95/95   Little PIP (0-100)  90 85  95/90 100/95 105/105  Little DIP (0-70)         (Blank rows = not tested) Patient with increased pain in the left more than the right with attempts of composite fist.  HAND FUNCTION:  Grip strength: Right: 26 lbs; Left: 24 lbs, Lateral pinch: Right: 12 lbs, Left: 6 lbs, and 3 point pinch: Right: 9 lbs, Left: 5 lbs 01/14/24  Grip strength: Right: 35 lbs; Left: 24 lbs, Lateral pinch: Right: 12 lbs, Left: 12 lbs, and 3 point pinch: Right: 9 lbs, Left: 5 lbs 01/24/24  Grip strength: Right: 38 lbs; Left: 26 lbs, Lateral pinch: Right: 14 lbs, Left: 12 lbs, and 3 point pinch: Right: 10 lbs, Left: 8 lbs 03/03/24  Grip strength: Right: 40 lbs; Left: 40 lbs, Lateral pinch: Right: 14 lbs, Left: 10 lbs, and 3 point pinch: Right: 10 lbs, Left: 8 lbs  COORDINATION: Decreased because of increased stiffness in digits unable to make composite fist.  And pain in digit with 3-point pinch with 2-point pinch  SENSATION: Since chemotherapy patient reports no neuropathy in all digits  EDEMA: Patient denies swelling  COGNITION: Overall cognitive status: Within functional limits for tasks assessed     TREATMENT DATE: 10/21 /25        Received verbal order to use iontophoresis with dexamethasone  from Medford Amber, PA of Dr. Kathlynn.on  02/01/24.    Since the injections patient showed decreased pain and tenderness  over bilateral fourth digits as well as A1 pulleys. Patient with increased flexion and composite fist.  But continues to show decreased PIP DIP flexion on the left fourth digit. As well as tenderness over the A1 pulleys  in the MC's 3-4/10 pain Continues to have stiffness on the right fourth digit with decreased DIP PIP flexion. Patient report continues numbness in all digits and sometimes 4th and 5th. Had a long discussion with patient about positioning during the day.  Patient likes to cross her arms and flex her wrist. Patient is a lot on her phone using digit flexion, sustained grip and wrist flexion and ulnar deviation causing ulnar nerve compression and carpal tunnel compression Discussed with patient modifications for use of phone as well as positioning her arms and hands during the day when just sitting or visiting with people. OT observe patient several times during the session propping up on her elbows with her wrist flexor crossing her arms on her chest with her wrist flexed. Also encouraged patient to use her elbow pads at night if she sleeps with her elbow flexed Use during her today her carpal tunnel braces.                                                                                           And then patient needs to focus on MCP flexion at 90 during the day to 3 times Followed by gentle passive range of motion to bilateral fourth digit DIP PIP flexion 10 reps 12 reps Prior to passive range of motion pain-free composite flexion of fourth Patient to avoid sustained tight grip and build up or enlarge her handles  Patient needed reminder and review several times about recommendations and home program   PATIENT EDUCATION: Education details: findings of eval and HEP  Person educated: Patient Education method: Explanation, Demonstration, Tactile cues, Verbal cues, and Handouts Education comprehension: verbalized understanding, returned demonstration, verbal cues required, and  needs further education    GOALS: Goals reviewed with patient? Yes    LONG TERM GOALS: Target date: 6 weeks  Patient to be independent in home program to decrease pain with home exercises to less than 5/10 to be able to increase motion Baseline: Pain 9/10 with decreased MCP flexion on the right 65-80 and PIP is 90-95.  Left MC 75-90 and PIP is 90 to 85 degrees. 03/03/24: pain 5/10 with MCP flexion R 65-95 and PIP 100-105, L MCP 75-95 and PIP 95-105 Goal status: Progressing  2.  Bilateral digits flexion improved for patient to be able to touch palm with pain less than 2/10 with cutting food, brushing her teeth and driving. Baseline: Patient limited in flexion of all digits with increased pain over the metacarpals as well as the volar palm and forearm.  With increased pain to 9/10 with use. 03/03/24: achieves full fist touching palm with 5/10 pain.  Goal status: Progressing  3.  Patient's pain in bilateral metacarpals as well as palm of forearm decreased to 2/10 to improve active range of motion of bilateral hands and wrist within functional limits Baseline: Pain 9/10 with decreased motion and decreased strength. 03/03/24: 5/10 pain.  Goal status: Progressing  4.  Bilateral grip and prehension strength improved to within normal range for her age to be able to improve her functional use in cooking and laundry symptom free Baseline: Right grip 26 pounds  and left 24 pounds with pain.  Lateral pinch 12 pounds on the right and left 6 pounds and 3 point pinch 9 on the right left 5 pounds.  With increased pain with functional use. 03/03/24: R grip L and R 40lbs. Lateral pinch R 14lbs, L 10 lbs.  Goal status: Progressing   ASSESSMENT:  CLINICAL IMPRESSION: Patient seen today for occupational therapy evaluation for bilateral hand and finger stiffness.  With increased pain.  Left worse than the right.  Patient is left-handed.  Patient had breast cancer with treatment of chemotherapy and report some  neuropathy in all digits.  Patient also had cervical surgery last few months patient with some arthritic changes to bilateral hands.  Patient present to OT evaluation with decreased AROM in all digits with 3rd through 5th the worst.  But patient also limited in second digit avoiding composite fist but using more lateral and 3-point pinch.  Patient's left hand pain worse than the right but motion wise right worse than the left.  See flowsheets above for motion.  Patient with increased pain over the metacarpals into the palm as well as the volar forearm.  Thumb range of motion within normal limits and denies any pain.  NOW patient made great progress since getting injections in both bilateral fourth digit A1 pulleys.  Patient pain decreased as well as flexibility improved.  Grip improved.  But patient continues to report numbness over the bilateral hands.  Appears still to compress her carpal tunnel and her ulnar nerve with habits of propping up her arms, crossing her hand arms and flexing her wrist.  Patient also spent a lot of time on her phone with a sustained grip as well as a flexion of the wrist and ulnar deviating compressing the nerve.  Observed several times during the session.  Also reviewed with patient focusing on passive range of motion for bilateral fourth digit DIP PIP flexion prior to attempts of composite flexion.  Patient to continue to enlarge her grips and use palm of bigger joints to pick up and carry and hold objects.  Patient continues to be limited in digit range of motion because of bilateral fourth digit inflammation and pain.  As well as decreased grip and prehension strength with increased pain.  Patient limited in functional use of bilateral hands and ADLs and IADLs.  Patient can benefit from skilled OT services to decrease pain and stiffness and increased motion and strength to return to prior level of function.  PERFORMANCE DEFICITS: in functional skills including ADLs, IADLs, ROM,  strength, pain, flexibility, decreased knowledge of use of DME, and UE functional use,   and psychosocial skills including environmental adaptation and routines and behaviors.   IMPAIRMENTS: are limiting patient from ADLs, IADLs, rest and sleep, play, leisure, and social participation.   COMORBIDITIES: has no other co-morbidities that affects occupational performance. Patient will benefit from skilled OT to address above impairments and improve overall function.  MODIFICATION OR ASSISTANCE TO COMPLETE EVALUATION: No modification of tasks or assist necessary to complete an evaluation.  OT OCCUPATIONAL PROFILE AND HISTORY: Problem focused assessment: Including review of records relating to presenting problem.  CLINICAL DECISION MAKING: LOW - limited treatment options, no task modification necessary  REHAB POTENTIAL: Good for goals  EVALUATION COMPLEXITY: Low      PLAN:  OT FREQUENCY: 1-2x/week  OT DURATION: 6 wks  PLANNED INTERVENTIONS: 97168 OT Re-evaluation, 97535 self care/ADL training, 02889 therapeutic exercise, 97530 therapeutic activity, 97112 neuromuscular re-education, 97140 manual therapy, [id number]  ultrasound, 02981 paraffin, 97039 fluidotherapy, 97010 moist heat, 97760 Orthotic Initial, [id number] Orthotic/Prosthetic subsequent, patient/family education, and DME and/or AE instructions    CONSULTED AND AGREED WITH PLAN OF CARE: Patient     Ancel Peters, OTR/L  03/11/2024, 5:18 PM

## 2024-03-13 LAB — COMPLIANCE DRUG ANALYSIS, UR

## 2024-03-14 ENCOUNTER — Ambulatory Visit
Admission: RE | Admit: 2024-03-14 | Discharge: 2024-03-14 | Disposition: A | Discharge status: Home / Self Care | Attending: Pain Medicine | Admitting: Pain Medicine

## 2024-03-14 ENCOUNTER — Ambulatory Visit
Admission: RE | Admit: 2024-03-14 | Discharge: 2024-03-14 | Disposition: A | Discharge status: Home / Self Care | Source: Ambulatory Visit | Attending: Pain Medicine | Admitting: Pain Medicine

## 2024-03-14 DIAGNOSIS — M5459 Other low back pain: Secondary | ICD-10-CM | POA: Diagnosis present

## 2024-03-14 DIAGNOSIS — M545 Low back pain, unspecified: Secondary | ICD-10-CM | POA: Diagnosis present

## 2024-03-14 DIAGNOSIS — G8929 Other chronic pain: Secondary | ICD-10-CM | POA: Diagnosis present

## 2024-03-16 LAB — 25-HYDROXY VITAMIN D LCMS D2+D3
25-Hydroxy, Vitamin D-2: 24 ng/mL
25-Hydroxy, Vitamin D-3: 7.8 ng/mL
25-Hydroxy, Vitamin D: 32 ng/mL

## 2024-03-16 LAB — COMP. METABOLIC PANEL (12)
AST: 27 IU/L (ref 0–40)
Albumin: 4 g/dL (ref 3.9–4.9)
Alkaline Phosphatase: 142 IU/L — ABNORMAL HIGH (ref 49–135)
BUN/Creatinine Ratio: 17 (ref 12–28)
BUN: 21 mg/dL (ref 8–27)
Bilirubin Total: 0.5 mg/dL (ref 0.0–1.2)
Calcium: 10.1 mg/dL (ref 8.7–10.3)
Chloride: 98 mmol/L (ref 96–106)
Creatinine, Ser: 1.26 mg/dL — ABNORMAL HIGH (ref 0.57–1.00)
Globulin, Total: 3.6 g/dL (ref 1.5–4.5)
Glucose: 94 mg/dL (ref 70–99)
Potassium: 4.8 mmol/L (ref 3.5–5.2)
Sodium: 137 mmol/L (ref 134–144)
Total Protein: 7.6 g/dL (ref 6.0–8.5)
eGFR: 48 mL/min/1.73 — ABNORMAL LOW (ref >59)

## 2024-03-16 LAB — VITAMIN B12: Vitamin B-12: 382 pg/mL (ref 232–1245)

## 2024-03-16 LAB — SEDIMENTATION RATE: Sed Rate: 53 mm/h — ABNORMAL HIGH (ref 0–40)

## 2024-03-16 LAB — HEMOGLOBIN A1C
Est. average glucose Bld gHb Est-mCnc: 126 mg/dL
Hgb A1c MFr Bld: 6 % — ABNORMAL HIGH (ref 4.8–5.6)

## 2024-03-16 LAB — C-REACTIVE PROTEIN: CRP: 20 mg/L — ABNORMAL HIGH (ref 0–10)

## 2024-03-16 LAB — MAGNESIUM: Magnesium: 2.1 mg/dL (ref 1.6–2.3)

## 2024-03-18 ENCOUNTER — Ambulatory Visit: Admitting: Occupational Therapy

## 2024-03-21 ENCOUNTER — Encounter: Payer: Self-pay | Admitting: Occupational Therapy

## 2024-03-21 ENCOUNTER — Ambulatory Visit: Admitting: Occupational Therapy

## 2024-03-21 DIAGNOSIS — M6281 Muscle weakness (generalized): Secondary | ICD-10-CM

## 2024-03-21 DIAGNOSIS — M25642 Stiffness of left hand, not elsewhere classified: Secondary | ICD-10-CM

## 2024-03-21 DIAGNOSIS — M25641 Stiffness of right hand, not elsewhere classified: Secondary | ICD-10-CM

## 2024-03-21 NOTE — Therapy (Signed)
 OUTPATIENT OCCUPATIONAL THERAPY ORTHO TREATMENT  Patient Name: Leslie Bautista MRN: [id number] DOB:11-19-1960, 63 y.o., female Today's Date: 03/21/2024  PCP: [name] NP REFERRING PROVIDER: Dr Kathlynn  END OF SESSION:  OT End of Session - 03/21/24 0901     Visit Number 12    Number of Visits 16    Date for Recertification  03/27/24    OT Start Time 0900    OT Stop Time 0945    OT Time Calculation (min) 45 min    Activity Tolerance Patient tolerated treatment well    Behavior During Therapy WFL for tasks assessed/performed            Past Medical History:  Diagnosis Date   Allergy    Cancer (HCC)    breast cancer on left side   Cataract    Depression    Diabetes mellitus without complication (HCC)    Hypertension    Morbid obesity with BMI of 50.0-59.9, adult (HCC)    Obstructive sleep apnea    Osteoarthritis of both knees    Pre-diabetes    Sleep apnea    Past Surgical History:  Procedure Laterality Date   ANKLE SURGERY Right    ligament repair   BREAST SURGERY     CATARACT EXTRACTION, BILATERAL Bilateral 2022   COLONOSCOPY  2015   COLONOSCOPY WITH PROPOFOL  N/A 07/26/2023   Procedure: COLONOSCOPY WITH PROPOFOL ;  Surgeon: Unk Corinn Skiff, MD;  Location: [facility];  Service: Gastroenterology;  Laterality: N/A;   EYE SURGERY     JOINT REPLACEMENT Right    Knee   POLYPECTOMY  07/26/2023   Procedure: POLYPECTOMY;  Surgeon: Unk Corinn Skiff, MD;  Location: [facility];  Service: Gastroenterology;;   TONSILLECTOMY Bilateral 06/14/2021   Procedure: TONSILLECTOMY;  Surgeon: Milissa Hamming, MD;  Location: [facility] ORS;  Service: ENT;  Laterality: Bilateral;   TOTAL KNEE ARTHROPLASTY Right    TUBAL LIGATION     Patient Active Problem List   Diagnosis Date Noted   Chronic feet pain (Bilateral) 03/10/2024   Chronic and pain (Bilateral) 03/10/2024   Numbness and tingling in both hands 03/10/2024   Non-articular hand pain (Bilateral) 03/10/2024    Multifactorial peripheral neuropathy 03/10/2024   Sensory peripheral neuropathy 03/10/2024   Abnormal NCS (nerve conduction studies) (11/07/2023) 03/10/2024   Diabetic sensorimotor polyneuropathy (HCC) 03/10/2024   Polyneuropathy, peripheral sensorimotor axonal 03/10/2024   Chronic low back pain (2ry area of Pain) (Right) w/o sciatica 03/10/2024   Lumbar facet joint pain 03/10/2024   Psychophysiological insomnia 02/19/2024   Chronic pain syndrome 02/04/2024   Pharmacologic therapy 02/04/2024   Disorder of skeletal system 02/04/2024   Problems influencing health status 02/04/2024   Diabetes mellitus type II, non insulin dependent (HCC) 09/25/2023   Cervical osteophyte 08/23/2023   Oropharyngeal dysphagia 08/23/2023   Chemotherapy-induced neuropathy 08/02/2023   Hx of colonic polyps 07/26/2023   Polyp of ascending colon 07/26/2023   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   Tenesmus (rectal) 06/29/2023   Chronic lower extremity pain (1ry area of Pain) (Bilateral) 06/29/2023   Acute cough 06/29/2023   Need for hepatitis C screening test 06/29/2023   Screening for deficiency anemia 06/29/2023   Encounter to establish care with new doctor 06/29/2023   Neuropathy 06/29/2023   Chemotherapy-induced neutropenia 12/05/2022   Thrombocytopenia 12/05/2022   Lower urinary tract infectious disease 12/03/2022   Myocardial injury 12/03/2022   Obesity, Class III, BMI 40-49.9 (morbid obesity) (HCC) 12/03/2022   Breast cancer (HCC) 12/03/2022   HLD (hyperlipidemia) 12/03/2022  OSA on CPAP 12/03/2022   Hypersensitivity reaction 10/24/2022   Invasive ductal carcinoma of breast, left (HCC) 09/26/2022   Endometrial thickening on ultrasound 07/05/2021   CKD stage G3a/A1, 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) 12/10/2019   Dyspareunia due to medical condition in female 10/24/2019   Postmenopausal bleeding 03/08/2018   Pre-diabetes 11/22/2015   Depression 06/03/2014   Class 3 severe obesity  without serious comorbidity with body mass index (BMI) of 50.0 to 59.9 in adult (HCC) 01/07/2014   Severe obesity (HCC) 01/07/2014   Hypertension, benign 10/25/2011   Primary localized osteoarthrosis, lower leg 12/19/2007    ONSET DATE: 3-6 months  REFERRING DIAG: Bilateral hand stiffness and pain mostly 3rd through 5th digits  THERAPY DIAG:  Stiffness of right hand, not elsewhere classified  Stiffness of left hand, not elsewhere classified  Muscle weakness (generalized)  Rationale for Evaluation and Treatment: Rehabilitation  SUBJECTIVE:   SUBJECTIVE STATEMENT: Pt states She told me not to hold my phone but I've been holding my phone until my hand goes numb.  Pt accompanied by: self  PERTINENT HISTORY: 01/03/24 Dr Kathlynn visit :  DIAGNOSTIC Upper extremity nerve conduction study: Chronic mild sensory polyneuropathy (11/07/2023) Lower extremity nerve conduction study: Chronic severe sensorimotor polyneuropathy (11/06/2023)    Assessment/Plan:   Assessment & Plan Bilateral hand stiffness and weakness  Chronic bilateral hand stiffness and weakness with difficulty making a fist. Negative for carpal tunnel syndrome and trigger finger. Suspected inflammatory problem affecting tendons, not related to bones or nerves. Possible rheumatologic condition considered, though no joint swelling observed. Diabetes is well-controlled with an A1c of 5.8, and impaired kidney function precludes anti-inflammatory medications. Refer to hand therapy at [facility] outpatient facility on [facility] for therapy twice a week for a month. Instruct to contact [name], the hand therapist, directly for specialized care. Advise to call back if no improvement after a month for re-evaluation. Plan for potential follow-up with a hand specialist, Dr. Ezra, if symptoms persist.  Mild bilateral hand osteoarthritis  Mild osteoarthritis in hand joints, not severe enough to explain current stiffness and  weakness. X-rays show minimal arthritis, particularly in the thumb. Diagnoses and all orders for this visit:  Stiffness of joints of both hands - Ambulatory Referral to Occupational Therapy   PRECAUTIONS: 10 pound limit because of cervical surgery about 3 months ago   WEIGHT BEARING RESTRICTIONS: No  PAIN:  Are you having pain? 0/10 at rest,  8/10 with triggering FALLS: Has patient fallen in last 6 months? No  LIVING ENVIRONMENT: Lives with: lives with their family   PLOF: Patient on disability.  Patient had breast cancer treatment and then had about 3 months ago cervical surgery.  Patient perform her own ADLs and helps at home with some cleaning some cooking and laundry.  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  PATIENT GOALS: I want the stiffness and the pain better so I can use my hands doing laundry, cooking, bathing, driving.    OBJECTIVE:  Note: Objective measures were completed at Evaluation unless otherwise noted.  HAND DOMINANCE: Left  ADLs: Unable to grasp to cook or laundry or cleaning cannot make a composite fist with increased pain and stiffness in the palm, fingers and forearm-difficulty with bathing and dressing and driving to  FUNCTIONAL OUTCOME MEASURES: To do next session UPPER EXTREMITY ROM:     Active ROM Right eval Right 03/03/24 Left eval Left  03/03/24  Shoulder flexion      Shoulder abduction      Shoulder adduction      Shoulder extension      Shoulder internal rotation      Shoulder external rotation      Elbow flexion      Elbow extension      Wrist flexion      Wrist extension 60 60 55 55  Wrist ulnar deviation      Wrist radial deviation      Wrist pronation      Wrist supination      (Blank rows = not tested)  Active ROM Right eval Left eval R/L 01/14/24 R/L 01/24/24 R/L  03/03/24  Thumb MCP (0-60)       Thumb IP (0-80)       Thumb Radial abd/add (0-55)        Thumb Palmar  abd/add (0-45)        Thumb Opposition to Small Finger        Index MCP (0-90) 65 75  70/75 60/75 65/75   Index PIP (0-100) 95  90 100/95 100/100 100/95  Index DIP (0-70)         Long MCP (0-90) 70  80  85/90 90/90 95/95   Long PIP (0-100)  90 90  100/90 100/95 100/100  Long DIP (0-70)         Ring MCP (0-90) 75  90  85/90 85/90 88/90   Ring PIP (0-100)  90  90 100/90 100/95 100/105  Ring DIP (0-70)         Little MCP (0-90)  80  90 85/90 90/90 95/95   Little PIP (0-100)  90 85  95/90 100/95 105/105  Little DIP (0-70)         (Blank rows = not tested) Patient with increased pain in the left more than the right with attempts of composite fist.  HAND FUNCTION:  Grip strength: Right: 26 lbs; Left: 24 lbs, Lateral pinch: Right: 12 lbs, Left: 6 lbs, and 3 point pinch: Right: 9 lbs, Left: 5 lbs 01/14/24  Grip strength: Right: 35 lbs; Left: 24 lbs, Lateral pinch: Right: 12 lbs, Left: 12 lbs, and 3 point pinch: Right: 9 lbs, Left: 5 lbs 01/24/24  Grip strength: Right: 38 lbs; Left: 26 lbs, Lateral pinch: Right: 14 lbs, Left: 12 lbs, and 3 point pinch: Right: 10 lbs, Left: 8 lbs 03/03/24  Grip strength: Right: 40 lbs; Left: 40 lbs, Lateral pinch: Right: 14 lbs, Left: 10 lbs, and 3 point pinch: Right: 10 lbs, Left: 8 lbs  COORDINATION: Decreased because of increased stiffness in digits unable to make composite fist.  And pain in digit with 3-point pinch with 2-point pinch  SENSATION: Since chemotherapy patient reports no neuropathy in all digits  EDEMA: Patient denies swelling  COGNITION: Overall cognitive status: Within functional limits for tasks assessed     TREATMENT DATE: 03/21/24        Received verbal order to use iontophoresis with dexamethasone  from Medford Amber, PA of Dr. Kathlynn.on  02/01/24.    Pt reports increased pain since prior session and triggering in L 4th digit, pt reports her grandson flushed her trigger splint down the toilet, new splint fabricated.  Patient continues to  show decreased PIP DIP flexion on the left fourth digit and increased tenderness over the A1 pulleys in the MC's 8/10 pain Patient report continues numbness in all digits  Reviewed application of joint modification from prior session - reports  sleeping with her hands between pillows, continues to place phone in hand.    Reviewed HEP MCP flexion to 90 during the day to 3 times Followed by gentle passive range of motion to bilateral fourth digit DIP PIP flexion 10 - 12 reps Prior to passive range of motion pain-free composite flexion of fourth Patient to avoid sustained tight grip and build up or enlarge her handles    PATIENT EDUCATION: Education details: findings of eval and HEP  Person educated: Patient Education method: Explanation, Demonstration, Tactile cues, Verbal cues, and Handouts Education comprehension: verbalized understanding, returned demonstration, verbal cues required, and needs further education    GOALS: Goals reviewed with patient? Yes    LONG TERM GOALS: Target date: 6 weeks  Patient to be independent in home program to decrease pain with home exercises to less than 5/10 to be able to increase motion Baseline: Pain 9/10 with decreased MCP flexion on the right 65-80 and PIP is 90-95.  Left MC 75-90 and PIP is 90 to 85 degrees. 03/03/24: pain 5/10 with MCP flexion R 65-95 and PIP 100-105, L MCP 75-95 and PIP 95-105 Goal status: Progressing  2.  Bilateral digits flexion improved for patient to be able to touch palm with pain less than 2/10 with cutting food, brushing her teeth and driving. Baseline: Patient limited in flexion of all digits with increased pain over the metacarpals as well as the volar palm and forearm.  With increased pain to 9/10 with use. 03/03/24: achieves full fist touching palm with 5/10 pain.  Goal status: Progressing  3.  Patient's pain in bilateral metacarpals as well as palm of forearm decreased to 2/10 to improve active range of motion of  bilateral hands and wrist within functional limits Baseline: Pain 9/10 with decreased motion and decreased strength. 03/03/24: 5/10 pain.  Goal status: Progressing  4.  Bilateral grip and prehension strength improved to within normal range for her age to be able to improve her functional use in cooking and laundry symptom free Baseline: Right grip 26 pounds and left 24 pounds with pain.  Lateral pinch 12 pounds on the right and left 6 pounds and 3 point pinch 9 on the right left 5 pounds.  With increased pain with functional use. 03/03/24: R grip L and R 40lbs. Lateral pinch R 14lbs, L 10 lbs.  Goal status: Progressing   ASSESSMENT:  CLINICAL IMPRESSION: Patient seen today for occupational therapy evaluation for bilateral hand and finger stiffness.  With increased pain.  Left worse than the right.  Patient is left-handed.  Patient had breast cancer with treatment of chemotherapy and report some neuropathy in all digits.  Patient also had cervical surgery last few months patient with some arthritic changes to bilateral hands.  Patient present to OT evaluation with decreased AROM in all digits with 3rd through 5th the worst.  But patient also limited in second digit avoiding composite fist but using more lateral and 3-point pinch.  Patient's left hand pain worse than the right but motion wise right worse than the left.  See flowsheets above for motion.  Patient with increased pain over the metacarpals into the palm as well as the volar forearm.  Thumb range of motion within normal limits and denies any pain.    NOW patient reports significantly increased pain and tenderness over B 4th digit A1 pulleys since losing her trigger finger splint - new splint fabricated in session. Plan to follow up with MD to discuss persistent pain. Reviewed with  patient focusing on passive range of motion for bilateral fourth digit DIP PIP flexion prior to attempts of composite flexion. Reviewed joint protection strategies,  pt reports poor carryover of modifications with phone use. Patient continues to be limited in digit range of motion because of bilateral fourth digit inflammation and pain.  As well as decreased grip and prehension strength with increased pain.  Patient limited in functional use of bilateral hands and ADLs and IADLs.  Patient can benefit from skilled OT services to decrease pain and stiffness and increased motion and strength to return to prior level of function.  PERFORMANCE DEFICITS: in functional skills including ADLs, IADLs, ROM, strength, pain, flexibility, decreased knowledge of use of DME, and UE functional use,   and psychosocial skills including environmental adaptation and routines and behaviors.   IMPAIRMENTS: are limiting patient from ADLs, IADLs, rest and sleep, play, leisure, and social participation.   COMORBIDITIES: has no other co-morbidities that affects occupational performance. Patient will benefit from skilled OT to address above impairments and improve overall function.  MODIFICATION OR ASSISTANCE TO COMPLETE EVALUATION: No modification of tasks or assist necessary to complete an evaluation.  OT OCCUPATIONAL PROFILE AND HISTORY: Problem focused assessment: Including review of records relating to presenting problem.  CLINICAL DECISION MAKING: LOW - limited treatment options, no task modification necessary  REHAB POTENTIAL: Good for goals  EVALUATION COMPLEXITY: Low      PLAN:  OT FREQUENCY: 1-2x/week  OT DURATION: 6 wks  PLANNED INTERVENTIONS: 97168 OT Re-evaluation, 97535 self care/ADL training, 02889 therapeutic exercise, 97530 therapeutic activity, 97112 neuromuscular re-education, 97140 manual therapy, 97035 ultrasound, 97018 paraffin, 02960 fluidotherapy, 97010 moist heat, 97760 Orthotic Initial, [id number] Orthotic/Prosthetic subsequent, patient/family education, and DME and/or AE instructions    CONSULTED AND AGREED WITH PLAN OF CARE: Patient     Elston JINNY Slot, OTR/L  03/21/2024, 1:41 PM

## 2024-03-26 ENCOUNTER — Telehealth: Payer: Self-pay

## 2024-03-26 ENCOUNTER — Encounter: Payer: Self-pay | Admitting: Family Medicine

## 2024-03-26 ENCOUNTER — Ambulatory Visit (INDEPENDENT_AMBULATORY_CARE_PROVIDER_SITE_OTHER): Admitting: Family Medicine

## 2024-03-26 VITALS — BP 103/57 | HR 60 | Resp 16 | Ht 63.0 in | Wt 278.6 lb

## 2024-03-26 DIAGNOSIS — N1831 Chronic kidney disease, stage 3a: Secondary | ICD-10-CM

## 2024-03-26 DIAGNOSIS — R140 Abdominal distension (gaseous): Secondary | ICD-10-CM | POA: Diagnosis not present

## 2024-03-26 DIAGNOSIS — N823 Fistula of vagina to large intestine: Secondary | ICD-10-CM

## 2024-03-26 DIAGNOSIS — E1122 Type 2 diabetes mellitus with diabetic chronic kidney disease: Secondary | ICD-10-CM

## 2024-03-26 DIAGNOSIS — Z7984 Long term (current) use of oral hypoglycemic drugs: Secondary | ICD-10-CM

## 2024-03-26 MED ORDER — RYBELSUS 7 MG PO TABS: 7.0000 mg | ORAL_TABLET | Freq: Every day | ORAL | 1 refills | Status: AC

## 2024-03-26 NOTE — Progress Notes (Signed)
 Established Patient Office Visit  Introduced to nurse practitioner role and practice setting.  All questions answered.  Discussed provider/patient relationship and expectations.   Subjective   Patient ID: Leslie Bautista, female    DOB: 03/01/1961  Age: 63 y.o. MRN: [id number]  Chief Complaint  Patient presents with   Personal Problem    Private reason would only like to explain to provider.    Discussed the use of AI scribe software for clinical note transcription with the patient, who gave verbal consent to proceed.  History of Present Illness Leslie Bautista is a 63 year old female who presents with concerns of a possible fistula and chronic bloating.  She experiences a persistent sensation of stool being present in her rectum, despite regular bowel movements and cleaning. The sensation is described as feeling like 'something in there' at the edge of her rectum. She has regular bowel movements two to three times a day but experiences stool leakage, which sometimes appears in her underwear. She is concerned about having accidents when passing gas. No pain or discomfort during urination and no blood in her stool. She also states she feels its is coming from her vaginal canal because when she wipes only front with urination she will see stool   She recalls seeing an OBGYN in the past who suggested a referral to a gastroenterologist, but she never received an appointment. She underwent a colonoscopy in March 2025 and was advised to follow up in seven years. . She associates the onset of her symptoms with prior radiation treatment from breast cancer.  She experiences chronic bloating and describes her stomach as feeling 'bloated all the time.' No abdominal pain, but her burps have a sulfur-like odor, similar to 'boiled eggs.' She takes omeprazole 20 mg for acid reflux, but it does not alleviate her symptoms. She has not identified any specific foods that exacerbate her bloating.  She has a  history of prediabetes, with a recent A1c of 6.0, and is currently taking Rybelsus  7 mg. She initially received a prescription for 3 mg, but the pharmacy provided 7 mg, which she has been taking      03/26/2024    9:34 AM 03/10/2024    9:19 AM 02/15/2024   11:09 AM  Depression screen PHQ 2/9  Decreased Interest 0 0 0  Down, Depressed, Hopeless 0 0 0  PHQ - 2 Score 0 0 0  Altered sleeping 2  2  Tired, decreased energy 3  2  Change in appetite 2  2  Feeling bad or failure about yourself  0  0  Trouble concentrating 0  0  Moving slowly or fidgety/restless 0  0  Suicidal thoughts 0  0  PHQ-9 Score 7  6  Difficult doing work/chores Not difficult at all  Not difficult at all       03/26/2024    9:34 AM 02/15/2024   11:10 AM 11/15/2023   11:27 AM 09/25/2023    8:41 AM  GAD 7 : Generalized Anxiety Score  Nervous, Anxious, on Edge 0 0 0 0  Control/stop worrying 2 0 0 0  Worry too much - different things 2 0 0 0  Trouble relaxing 1 0 0 0  Restless 0 0 0 0  Easily annoyed or irritable 1 0 0 0  Afraid - awful might happen 0 0 0 0  Total GAD 7 Score 6 0 0 0  Anxiety Difficulty Somewhat difficult Not difficult at all Not difficult at all Not difficult at  all     ROS  Negative unless indicated in HPI   Objective:     BP (!) 103/57   Pulse 60   Resp 16   Ht 5' 3 (1.6 m)   Wt 278 lb 9.6 oz (126.4 kg)   LMP 10/15/2019 Comment: Patient states there is no way she is pregnant.   SpO2 98%   BMI 49.35 kg/m    Physical Exam Constitutional:      General: She is not in acute distress.    Appearance: Normal appearance. She is obese. She is not toxic-appearing or diaphoretic.  HENT:     Head: Normocephalic.  Eyes:     Extraocular Movements: Extraocular movements intact.     Pupils: Pupils are equal, round, and reactive to light.  Cardiovascular:     Rate and Rhythm: Normal rate and regular rhythm.     Pulses: Normal pulses.     Heart sounds: Normal heart sounds. No murmur  heard.    No friction rub. No gallop.  Pulmonary:     Effort: No respiratory distress.     Breath sounds: No stridor. No wheezing, rhonchi or rales.  Chest:     Chest wall: No tenderness.  Abdominal:     General: Bowel sounds are normal. There is no distension.     Palpations: Abdomen is soft. There is no mass.     Tenderness: There is no right CVA tenderness, left CVA tenderness, guarding or rebound.     Hernia: No hernia is present.     Comments: Mild tenderness to deep palpation of RLQ  Genitourinary:    Comments: Declined physical assessment during visit Musculoskeletal:     Right lower leg: No edema.     Left lower leg: No edema.  Skin:    General: Skin is warm and dry.     Capillary Refill: Capillary refill takes less than 2 seconds.  Neurological:     General: No focal deficit present.     Mental Status: She is alert and oriented to person, place, and time. Mental status is at baseline.  Psychiatric:        Mood and Affect: Mood normal.        Behavior: Behavior normal.        Thought Content: Thought content normal.        Judgment: Judgment normal.      No results found for any visits on 03/26/24.    The 10-year ASCVD risk score (Arnett DK, et al., 2019) is: 6.8%    Assessment & Plan:  Rectal vaginal fistula -     Ambulatory referral to Gastroenterology -     Ambulatory referral to Urogynecology  Abdominal bloating -     Ambulatory referral to Gastroenterology -     US  Abdomen Complete; Future  Type 2 diabetes mellitus with stage 3a chronic kidney disease, without long-term current use of insulin (HCC) -     Rybelsus ; Take 1 tablet (7 mg total) by mouth daily.  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 - Declines physical pelvic exam today - was seen by GYN in March/April for same issue - provider was  unable to visualize fistula concerns - mentions in note referral to GI, but pt states never got referral - there was mention of barium imaging - unsure through of exact imaging needed.  - at this time, unable to discern exact cause given lack of pelvic exam - Pt would like referral to GI - will place - fecal incontinence/ fistula - colonoscopy found one polyp - recommend repeat in 5- 7 years per procedural note - will also place referral to Urogyn as well - given incontinence issues, may have underlying pelvic floor issues - Ordered abdominal ultrasound to assess for underlying causes of bloating and potential fistula. - Advised scheduling toileting to prevent accidents.  Chronic bloating and flatulence Persistent bloating and flatulence with no specific food triggers identified. No abdominal pain reported. Symptoms may be related to GERD or other gastrointestinal issues. - Recommended over-the-counter Gas-X for bloating. - Advised increasing water intake. - may be related to GERD, may need upper scope done - Ordered abdominal ultrasound to evaluate for underlying causes of bloating. - Referred to GI specialist for further evaluation.  Gastroesophageal reflux disease (GERD) - Pt is on PPI - although pantoprazole  and omeprazole both listed, ordered by a Dr. Milissa - recommend pt look and see what she is taking daily - Referred to GI specialist for further evaluation of GERD and associated symptoms.  Type 2 diabetes mellitus - A1c was checked by PCP on 02/15/24,  was 5.7%, and then it was repeated by pain mgmt which showed 6.0%, increased, but controlled DMII. - confusion today on rybelsus  dose, was taking 3mg  and then restarted 7mg  - okay to continue rybelsus  7mg  daily given increased in A1c% - f/u as planned in two  months to establish with new PCP    Return in about 2 months (around 05/26/2024) for w/ new PCP for chronic disease mgmt.   I, Curtis DELENA Boom, FNP, have reviewed all documentation for this visit. The documentation on 03/26/24 for the exam, diagnosis, procedures, and orders are all accurate and complete.   Curtis DELENA Boom, FNP

## 2024-03-26 NOTE — Telephone Encounter (Signed)
-----   Message from Curtis DELENA Boom sent at 03/26/2024 12:27 PM EST ----- Regarding: GERD meds Can we call and confirm what medication she is taking daily? Pantroprozole or omeprazole? Both are listed and ordered by a Dr. Milissa.   Thanks!  Usually its one or the other.

## 2024-03-26 NOTE — Telephone Encounter (Signed)
 Spoke with pt and pt stated she takes both. I asked did she know why she taking both and pt stated she didn't know as per Kellie its either one or the other and pt has not asked Dr. Milissa regarding this.

## 2024-03-26 NOTE — Telephone Encounter (Signed)
 Spoke with pt and advised recommendation. Pt verbalized understanding.

## 2024-03-26 NOTE — Telephone Encounter (Signed)
 Spoke to pharmacy recommending only taking pantoprozole 40mg  daily. Can take OTC pepcid  and as needed tums for sulfur burps. Referral for GI is in for further assessment

## 2024-04-01 ENCOUNTER — Ambulatory Visit: Attending: Orthopedic Surgery | Admitting: Occupational Therapy

## 2024-04-01 DIAGNOSIS — M25642 Stiffness of left hand, not elsewhere classified: Secondary | ICD-10-CM | POA: Insufficient documentation

## 2024-04-01 DIAGNOSIS — R208 Other disturbances of skin sensation: Secondary | ICD-10-CM | POA: Diagnosis present

## 2024-04-01 DIAGNOSIS — M6281 Muscle weakness (generalized): Secondary | ICD-10-CM | POA: Insufficient documentation

## 2024-04-01 DIAGNOSIS — M25641 Stiffness of right hand, not elsewhere classified: Secondary | ICD-10-CM | POA: Diagnosis present

## 2024-04-01 DIAGNOSIS — M79641 Pain in right hand: Secondary | ICD-10-CM | POA: Insufficient documentation

## 2024-04-01 DIAGNOSIS — M79642 Pain in left hand: Secondary | ICD-10-CM | POA: Insufficient documentation

## 2024-04-01 NOTE — Therapy (Signed)
 OUTPATIENT OCCUPATIONAL THERAPY ORTHO TREATMENT/RECERT  Patient Name: Leslie Bautista MRN: [id number] DOB:05-07-1961, 63 y.o., female Today's Date: 04/01/2024  PCP: [name] NP REFERRING PROVIDER: Dr Kathlynn  END OF SESSION:  OT End of Session - 04/01/24 1936     Visit Number 13    Number of Visits 16    Date for Recertification  05/13/24    OT Start Time 1035    OT Stop Time 1111    OT Time Calculation (min) 36 min    Activity Tolerance Patient tolerated treatment well    Behavior During Therapy WFL for tasks assessed/performed            Past Medical History:  Diagnosis Date   Allergy    Cancer (HCC)    breast cancer on left side   Cataract    Depression    Diabetes mellitus without complication (HCC)    Hypertension    Morbid obesity with BMI of 50.0-59.9, adult (HCC)    Obstructive sleep apnea    Osteoarthritis of both knees    Pre-diabetes    Sleep apnea    Past Surgical History:  Procedure Laterality Date   ANKLE SURGERY Right    ligament repair   BREAST SURGERY     CATARACT EXTRACTION, BILATERAL Bilateral 2022   COLONOSCOPY  2015   COLONOSCOPY WITH PROPOFOL  N/A 07/26/2023   Procedure: COLONOSCOPY WITH PROPOFOL ;  Surgeon: Unk Corinn Skiff, MD;  Location: [facility];  Service: Gastroenterology;  Laterality: N/A;   EYE SURGERY     JOINT REPLACEMENT Right    Knee   POLYPECTOMY  07/26/2023   Procedure: POLYPECTOMY;  Surgeon: Unk Corinn Skiff, MD;  Location: [facility];  Service: Gastroenterology;;   TONSILLECTOMY Bilateral 06/14/2021   Procedure: TONSILLECTOMY;  Surgeon: Milissa Hamming, MD;  Location: [facility];  Service: ENT;  Laterality: Bilateral;   TOTAL KNEE ARTHROPLASTY Right    TUBAL LIGATION     Patient Active Problem List   Diagnosis Date Noted   Chronic feet pain (Bilateral) 03/10/2024   Chronic and pain (Bilateral) 03/10/2024   Numbness and tingling in both hands 03/10/2024   Non-articular hand pain (Bilateral) 03/10/2024    Multifactorial peripheral neuropathy 03/10/2024   Sensory peripheral neuropathy 03/10/2024   Abnormal NCS (nerve conduction studies) (11/07/2023) 03/10/2024   Diabetic sensorimotor polyneuropathy (HCC) 03/10/2024   Polyneuropathy, peripheral sensorimotor axonal 03/10/2024   Chronic low back pain (2ry area of Pain) (Right) w/o sciatica 03/10/2024   Lumbar facet joint pain 03/10/2024   Psychophysiological insomnia 02/19/2024   Chronic pain syndrome 02/04/2024   Pharmacologic therapy 02/04/2024   Disorder of skeletal system 02/04/2024   Problems influencing health status 02/04/2024   Diabetes mellitus type II, non insulin dependent (HCC) 09/25/2023   Cervical osteophyte 08/23/2023   Oropharyngeal dysphagia 08/23/2023   Chemotherapy-induced neuropathy 08/02/2023   Hx of colonic polyps 07/26/2023   Polyp of ascending colon 07/26/2023   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   Tenesmus (rectal) 06/29/2023   Chronic lower extremity pain (1ry area of Pain) (Bilateral) 06/29/2023   Acute cough 06/29/2023   Need for hepatitis C screening test 06/29/2023   Screening for deficiency anemia 06/29/2023   Encounter to establish care with new doctor 06/29/2023   Neuropathy 06/29/2023   Chemotherapy-induced neutropenia 12/05/2022   Thrombocytopenia 12/05/2022   Lower urinary tract infectious disease 12/03/2022   Myocardial injury 12/03/2022   Obesity, Class III, BMI 40-49.9 (morbid obesity) (HCC) 12/03/2022   Breast cancer (HCC) 12/03/2022   HLD (hyperlipidemia) 12/03/2022  OSA on CPAP 12/03/2022   Hypersensitivity reaction 10/24/2022   Invasive ductal carcinoma of breast, left (HCC) 09/26/2022   Endometrial thickening on ultrasound 07/05/2021   CKD stage G3a/A1, 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) 12/10/2019   Dyspareunia due to medical condition in female 10/24/2019   Postmenopausal bleeding 03/08/2018   Pre-diabetes 11/22/2015   Depression 06/03/2014   Class 3 severe obesity  without serious comorbidity with body mass index (BMI) of 50.0 to 59.9 in adult (HCC) 01/07/2014   Severe obesity (HCC) 01/07/2014   Hypertension, benign 10/25/2011   Primary localized osteoarthrosis, lower leg 12/19/2007    ONSET DATE: 3-6 months  REFERRING DIAG: Bilateral hand stiffness and pain mostly 3rd through 5th digits  THERAPY DIAG:  Stiffness of right hand, not elsewhere classified  Stiffness of left hand, not elsewhere classified  Muscle weakness (generalized)  Other disturbances of skin sensation  Pain in left hand  Pain in right hand  Rationale for Evaluation and Treatment: Rehabilitation  SUBJECTIVE:   SUBJECTIVE STATEMENT: I did not see Dr. Ezra but he said he cannot give me a shot yet.  I have to wait about a month.  But I feel my hands get all better.  I listen to you and I am holding my phone now like this.  And when I am driving I am not gripping the steering wheel is tight.  My hands is not falling asleep as much.  But I still have that neuropathy.  Pt accompanied by: self  PERTINENT HISTORY: 01/03/24 Dr Kathlynn visit :  DIAGNOSTIC Upper extremity nerve conduction study: Chronic mild sensory polyneuropathy (11/07/2023) Lower extremity nerve conduction study: Chronic severe sensorimotor polyneuropathy (11/06/2023)    Assessment/Plan:   Assessment & Plan Bilateral hand stiffness and weakness  Chronic bilateral hand stiffness and weakness with difficulty making a fist. Negative for carpal tunnel syndrome and trigger finger. Suspected inflammatory problem affecting tendons, not related to bones or nerves. Possible rheumatologic condition considered, though no joint swelling observed. Diabetes is well-controlled with an A1c of 5.8, and impaired kidney function precludes anti-inflammatory medications. Refer to hand therapy at [facility] outpatient facility on [facility] for therapy twice a week for a month. Instruct to contact [name], the hand therapist,  directly for specialized care. Advise to call back if no improvement after a month for re-evaluation. Plan for potential follow-up with a hand specialist, Dr. Ezra, if symptoms persist.  Mild bilateral hand osteoarthritis  Mild osteoarthritis in hand joints, not severe enough to explain current stiffness and weakness. X-rays show minimal arthritis, particularly in the thumb. Diagnoses and all orders for this visit:  Stiffness of joints of both hands - Ambulatory Referral to Occupational Therapy   PRECAUTIONS: 10 pound limit because of cervical surgery about 3 months ago   WEIGHT BEARING RESTRICTIONS: No  PAIN:  Are you having pain?  Tenderness over the left fourth A1 pulley 5/10 and right fourth A1 pulley 3/10 but no locking or triggering in session FALLS: Has patient fallen in last 6 months? No  LIVING ENVIRONMENT: Lives with: lives with their family   PLOF: Patient on disability.  Patient had breast cancer treatment and then had about 3 months ago cervical surgery.  Patient perform her own ADLs and helps at home with some cleaning some cooking and laundry.  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  PATIENT GOALS: I want the stiffness and the pain better  so I can use my hands doing laundry, cooking, bathing, driving.    OBJECTIVE:  Note: Objective measures were completed at Evaluation unless otherwise noted.  HAND DOMINANCE: Left  ADLs: Unable to grasp to cook or laundry or cleaning cannot make a composite fist with increased pain and stiffness in the palm, fingers and forearm-difficulty with bathing and dressing and driving to  FUNCTIONAL OUTCOME MEASURES: To do next session UPPER EXTREMITY ROM:     Active ROM Right eval Right 03/03/24 Left eval Left 03/03/24  Shoulder flexion      Shoulder abduction      Shoulder adduction      Shoulder extension      Shoulder internal rotation      Shoulder external rotation       Elbow flexion      Elbow extension      Wrist flexion      Wrist extension 60 60 55 55  Wrist ulnar deviation      Wrist radial deviation      Wrist pronation      Wrist supination      (Blank rows = not tested)  Active ROM Right eval Left eval R/L 01/14/24 R/L 01/24/24 R/L  03/03/24  Thumb MCP (0-60)       Thumb IP (0-80)       Thumb Radial abd/add (0-55)        Thumb Palmar abd/add (0-45)        Thumb Opposition to Small Finger        Index MCP (0-90) 65 75  70/75 60/75 65/75   Index PIP (0-100) 95  90 100/95 100/100 100/95  Index DIP (0-70)         Long MCP (0-90) 70  80  85/90 90/90 95/95   Long PIP (0-100)  90 90  100/90 100/95 100/100  Long DIP (0-70)         Ring MCP (0-90) 75  90  85/90 85/90 88/90   Ring PIP (0-100)  90  90 100/90 100/95 100/105  Ring DIP (0-70)         Little MCP (0-90)  80  90 85/90 90/90 95/95   Little PIP (0-100)  90 85  95/90 100/95 105/105  Little DIP (0-70)         (Blank rows = not tested) Patient with increased pain in the left more than the right with attempts of composite fist.  HAND FUNCTION:  Grip strength: Right: 26 lbs; Left: 24 lbs, Lateral pinch: Right: 12 lbs, Left: 6 lbs, and 3 point pinch: Right: 9 lbs, Left: 5 lbs 01/14/24  Grip strength: Right: 35 lbs; Left: 24 lbs, Lateral pinch: Right: 12 lbs, Left: 12 lbs, and 3 point pinch: Right: 9 lbs, Left: 5 lbs 01/24/24  Grip strength: Right: 38 lbs; Left: 26 lbs, Lateral pinch: Right: 14 lbs, Left: 12 lbs, and 3 point pinch: Right: 10 lbs, Left: 8 lbs 03/03/24  Grip strength: Right: 40 lbs; Left: 40 lbs, Lateral pinch: Right: 14 lbs, Left: 10 lbs, and 3 point pinch: Right: 10 lbs, Left: 8 lbs  COORDINATION: Decreased because of increased stiffness in digits unable to make composite fist.  And pain in digit with 3-point pinch with 2-point pinch  SENSATION: Since chemotherapy patient reports no neuropathy in all digits  EDEMA: Patient denies swelling  COGNITION: Overall cognitive  status: Within functional limits for tasks assessed     TREATMENT DATE: 04/01/24       Patient arrived this date  with active range of motion in bilateral hands within normal limits. Increased motion in bilateral fourth digits Continues to have tenderness over the A1 pulleys 5/10 on the left 3/10 over the right But no locking or triggering in session. Patient reports she did had some triggering yesterday Did see Dr. Ezra last week but was not able to get a shot yet.  Needs to wait a month.   Reviewed with patient again the importance of using heat in the morning prior to gentle passive range of motion to DIP PIP of digits and gentle composite flexion prior to attempts of active fist  Patient verbalized understanding  Paraffin done 8 minutes Prior to review of home exercises for PIP DIP flexion followed by composite fist. No triggering no locking in session Minimal pain more stiffness  While patient was in paraffin reviewed with patient again the importance of joint protection and modifications Patient denies any numbness in bilateral hands but continued to have neuropathy because of chemotherapy. Patient verbalized she has changed the way she holds her phone as well as when driving and gripping tight .  Patient does cook for the family at times reviewed with patient about enlarging her grips on her utensils knives. As well as using her palms or forearms when carrying groceries or cleaning.  Ultrasound at 20% at 3.3 MHz and 1.0 intensity over bilateral fourth A1 pulleys at end of session for 4 minutes to decrease pain and inflammation   PATIENT EDUCATION: Education details: findings of eval and HEP  Person educated: Patient Education method: Explanation, Demonstration, Tactile cues, Verbal cues, and Handouts Education comprehension: verbalized understanding, returned demonstration, verbal cues required, and needs further education    GOALS: Goals reviewed with patient?  Yes    LONG TERM GOALS: Target date: 6 weeks  Patient to be independent in home program to decrease pain with home exercises to less than 5/10 to be able to increase motion Baseline: Pain 9/10 with decreased MCP flexion on the right 65-80 and PIP is 90-95.  Left MC 75-90 and PIP is 90 to 85 degrees. 03/03/24: pain 5/10 with MCP flexion R 65-95 and PIP 100-105, L MCP 75-95 and PIP 95-105 Goal status: Met  2.  Bilateral digits flexion improved for patient to be able to touch palm with pain less than 2/10 with cutting food, brushing her teeth and driving. Baseline: Patient limited in flexion of all digits with increased pain over the metacarpals as well as the volar palm and forearm.  With increased pain to 9/10 with use. 03/03/24: achieves full fist touching palm with 5/10 pain.  Goal status: Progressing  3.  Patient's pain in bilateral metacarpals as well as palm of forearm decreased to 2/10 to improve active range of motion of bilateral hands and wrist within functional limits Baseline: Pain 9/10 with decreased motion and decreased strength. 03/03/24: 5/10 pain.  Goal status: Met  4.  Bilateral grip and prehension strength improved to within normal range for her age to be able to improve her functional use in cooking and laundry symptom free Baseline: Right grip 26 pounds and left 24 pounds with pain.  Lateral pinch 12 pounds on the right and left 6 pounds and 3 point pinch 9 on the right left 5 pounds.  With increased pain with functional use. 03/03/24: R grip L and R 40lbs. Lateral pinch R 14lbs, L 10 lbs.  Goal status: Progressing   ASSESSMENT:  CLINICAL IMPRESSION: Patient seen  for occupational therapy for bilateral hand  and finger stiffness.  With increased pain.  Left worse than the right.  Patient is left-handed.  Patient had breast cancer with treatment of chemotherapy and report some neuropathy in all digits.  Patient also had cervical surgery last few months patient with some  arthritic changes to bilateral hands.  Patient present to OT evaluation with decreased AROM in all digits with 3rd through 5th the worst.  But patient also limited in second digit avoiding composite fist but using more lateral and 3-point pinch.  Patient's left hand pain worse than the right but motion wise right worse than the left.  See flowsheets above for motion.  Patient with increased pain over the metacarpals into the palm as well as the volar forearm.  Thumb range of motion within normal limits and denies any pain.    NOW patient arrived today with great improvement in pain in all digits.  No triggering no locking of bilateral fourth digits.  Patient continues to have chemo neuropathy.  But patient report numbness improving.  Patient did follow-up with Dr. Ezra.  But not eligible for shot yet.  Patient continues to be tender over the fourth A1 pulleys 3-5/10. Reviewed with patient again on passive range of motion for bilateral fourth digit DIP /PIP flexion prior to attempts of composite flexion.  Patient did do some modifications since last time seen with holding her phone and driving.  Reinforced with patient again the importance of joint protection strategies with cooking as well as carrying groceries and performing laundry.  Patient limited in functional use of bilateral hands and ADLs and IADLs.  Patient can benefit from skilled OT services to decrease pain and stiffness and increased motion and strength to return to prior level of function.  PERFORMANCE DEFICITS: in functional skills including ADLs, IADLs, ROM, strength, pain, flexibility, decreased knowledge of use of DME, and UE functional use,   and psychosocial skills including environmental adaptation and routines and behaviors.   IMPAIRMENTS: are limiting patient from ADLs, IADLs, rest and sleep, play, leisure, and social participation.   COMORBIDITIES: has no other co-morbidities that affects occupational performance. Patient will  benefit from skilled OT to address above impairments and improve overall function.  MODIFICATION OR ASSISTANCE TO COMPLETE EVALUATION: No modification of tasks or assist necessary to complete an evaluation.  OT OCCUPATIONAL PROFILE AND HISTORY: Problem focused assessment: Including review of records relating to presenting problem.  CLINICAL DECISION MAKING: LOW - limited treatment options, no task modification necessary  REHAB POTENTIAL: Good for goals  EVALUATION COMPLEXITY: Low      PLAN:  OT FREQUENCY: 2-3 visits  OT DURATION: 6 wks  PLANNED INTERVENTIONS: 97168 OT Re-evaluation, 97535 self care/ADL training, 02889 therapeutic exercise, 97530 therapeutic activity, 97112 neuromuscular re-education, 97140 manual therapy, 97035 ultrasound, 97018 paraffin, 02960 fluidotherapy, 97010 moist heat, 97760 Orthotic Initial, [id number] Orthotic/Prosthetic subsequent, patient/family education, and DME and/or AE instructions    CONSULTED AND AGREED WITH PLAN OF CARE: Patient     Ancel Peters, OTR/L  04/01/2024, 7:38 PM

## 2024-04-02 ENCOUNTER — Ambulatory Visit: Payer: Self-pay | Admitting: Family Medicine

## 2024-04-06 NOTE — Progress Notes (Unsigned)
 04/07/2024 Leslie Bautista [id number] 19-Aug-1960  Gastroenterology Office Note     Primary Care Physician:  [name] Curtis LABOR, FNP  Primary GI Provider: Jinny Carmine, MD    Chief Complaint   Chief Complaint  Patient presents with   Follow-up    Rectal pressure after BM's-also abd bloating all the time-large BM's-no bleeding-     History of Present Illness   Leslie Bautista is a 63 y.o. 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   08/30/2023 seen by GYN with more occurrences of passing stool through vagina. Barium enema recommended but this was not done.    08/07/2023 seen by GYN on 08/07/2023 for concerns of passing stool through her vagina.    Patient last seen by Dr. Unk on 07/17/2023 with intermittent passing of stool from the front/vagina in addition to rectum. Colonoscopy scheduled.   Patient last seen by Dr. Unk on 07/17/2023 with intermittent passing of stool from the front/vagina in addition to rectum.  Today, patient reports she is still having intermittent episodes of passing stool from the front/vagina. Sometimes she does still see stool when she urinates a copule of times a month.  She reports that she has a bowel movement every day or every 2 days. She states that she can wipe just at the the front after urinating and there will be brown stool when she wipes. She also states that on a few occasions she has seen stool in her underwear and didn't realize she had any leakage. Patient reports that this started occurring after she finished radiation for breast cancer, approximately October 2024. Denies melena or hematochezia. Denies diarrhea, rectal pain, or itching.   She also has noticed feeling bloated all the time since having radiation. She reports that she alternates between taking omeprazole and pantoprazole .  07/26/2023 Colonoscopy - One 5 mm polyp in the ascending colon, removed with a cold snare.  Resected and retrieved.  - The distal rectum and anal verge are normal on retroflexion view. - The exam was otherwise normal. - repeat in 7 years  Past Medical History:  Diagnosis Date   Allergy    Cancer (HCC)    breast cancer on left side   Cataract    Depression    Diabetes mellitus without complication (HCC)    Hypertension    Morbid obesity with BMI of 50.0-59.9, adult (HCC)    Obstructive sleep apnea    Osteoarthritis of both knees    Pre-diabetes    Sleep apnea     Past Surgical History:  Procedure Laterality Date   ANKLE SURGERY Right    ligament repair   BREAST SURGERY     CATARACT EXTRACTION, BILATERAL Bilateral 2022   COLONOSCOPY  2015   COLONOSCOPY WITH PROPOFOL  N/A 07/26/2023   Procedure: COLONOSCOPY WITH PROPOFOL ;  Surgeon: Unk Corinn Skiff, MD;  Location: [facility];  Service: Gastroenterology;  Laterality: N/A;   EYE SURGERY     JOINT REPLACEMENT Right    Knee   POLYPECTOMY  07/26/2023   Procedure: POLYPECTOMY;  Surgeon: Unk Corinn Skiff, MD;  Location: [facility];  Service: Gastroenterology;;   TONSILLECTOMY Bilateral 06/14/2021   Procedure: TONSILLECTOMY;  Surgeon: Milissa Hamming, MD;  Location: [facility] ORS;  Service: ENT;  Laterality: Bilateral;   TOTAL KNEE ARTHROPLASTY Right    TUBAL LIGATION      Current Outpatient Medications  Medication Sig Dispense Refill   anastrozole (ARIMIDEX) 1 MG tablet Take 1 mg by mouth  daily.     atorvastatin  (LIPITOR) 20 MG tablet Take 20 mg by mouth at bedtime.     DULoxetine (CYMBALTA) 30 MG capsule Take 30 mg by mouth daily.     gabapentin  (NEURONTIN ) 300 MG capsule Take one 300mg  capsule in the morning and at night take one 300 mg tablet plus 600 tablet. 180 capsule 2   gabapentin  (NEURONTIN ) 600 MG tablet Take 600 mg by mouth 2 (two) times daily.     losartan -hydrochlorothiazide (HYZAAR) 50-12.5 MG tablet TAKE 1 TABLET BY MOUTH EVERY DAY 90 tablet 1   meloxicam  (MOBIC ) 7.5 MG tablet Take 7.5 mg by mouth  2 (two) times daily.     nortriptyline (PAMELOR) 10 MG capsule Take 20 mg by mouth at bedtime.     omeprazole (PRILOSEC) 20 MG capsule Take 20 mg by mouth every morning.     pantoprazole  (PROTONIX ) 40 MG tablet Take 40 mg by mouth daily.     Semaglutide  (RYBELSUS ) 7 MG TABS Take 1 tablet (7 mg total) by mouth daily. 90 tablet 1   traZODone  (DESYREL ) 50 MG tablet Take 1 tablet (50 mg total) by mouth at bedtime. 90 tablet 1   No current facility-administered medications for this visit.    Allergies as of 04/07/2024 - Review Complete 04/07/2024  Allergen Reaction Noted   Metformin  and related Diarrhea 07/03/2023   Oxycontin  [oxycodone  hcl] Rash 09/29/2014    Family History  Problem Relation Age of Onset   Pancreatic cancer Mother    Prostate cancer Father    Hypertension Sister    Hypertension Brother     Social History   Socioeconomic History   Marital status: Single    Spouse name: Not on file   Number of children: 2   Years of education: Not on file   Highest education level: Not on file  Occupational History   Not on file  Tobacco Use   Smoking status: Never   Smokeless tobacco: Never  Vaping Use   Vaping status: Never Used  Substance and Sexual Activity   Alcohol use: Not Currently   Drug use: No   Sexual activity: Not Currently    Birth control/protection: Post-menopausal  Other Topics Concern   Not on file  Social History Narrative   Lives with daughters and grand child   Social Drivers of Health   Financial Resource Strain: Low Risk  (03/24/2024)   Received from [facility]   Overall Financial Resource Strain (CARDIA)    Difficulty of Paying Living Expenses: Not hard at all  Food Insecurity: No Food Insecurity (03/24/2024)   Received from [facility]   Hunger Vital Sign    Within the past 12 months, you worried that your food would run out before you got the money to buy more.: Never true    Within the past 12 months,  the food you bought just didn't last and you didn't have money to get more.: Never true  Transportation Needs: Unmet Transportation Needs (03/24/2024)   Received from [facility] - Transportation    In the past 12 months, has lack of transportation kept you from medical appointments or from getting medications?: Yes    Lack of Transportation (Non-Medical): Yes  Physical Activity: Inactive (08/01/2023)   Exercise Vital Sign    Days of Exercise per Week: 0 days    Minutes of Exercise per Session: 0 min  Stress: No Stress Concern Present (08/01/2023)   [employer] - Occupational Stress Questionnaire    Feeling of Stress : Not at all  Social Connections: Moderately Isolated (08/01/2023)   Social Connection and Isolation Panel    Frequency of Communication with Friends and Family: More than three times a week    Frequency of Social Gatherings with Friends and Family: More than three times a week    Attends Religious Services: More than 4 times per year    Active Member of Clubs or Organizations: No    Attends Banker Meetings: Never    Marital Status: Never married  Intimate Partner Violence: Not At Risk (08/01/2023)   Humiliation, Afraid, Rape, and Kick questionnaire    Fear of Current or Ex-Partner: No    Emotionally Abused: No    Physically Abused: No    Sexually Abused: No     RELEVANT GI HISTORY, IMAGING AND LABS: CBC    Component Value Date/Time   WBC 6.3 08/16/2023 1218   RBC 4.08 08/16/2023 1218   HGB 11.8 (L) 08/16/2023 1218   HGB 11.7 06/29/2023 1035   HCT 36.6 08/16/2023 1218   HCT 35.7 06/29/2023 1035   PLT 171 08/16/2023 1218   PLT 165 06/29/2023 1035   MCV 89.7 08/16/2023 1218   MCV 85 06/29/2023 1035   MCV 88 06/21/2014 0402   MCH 28.9 08/16/2023 1218   MCHC 32.2 08/16/2023 1218   RDW 14.6 08/16/2023 1218   RDW 14.8 06/29/2023 1035   RDW 14.2 06/21/2014 0402   LYMPHSABS 1.3 06/29/2023 1035    MONOABS 0.3 12/26/2022 1403   EOSABS 0.1 06/29/2023 1035   BASOSABS 0.0 06/29/2023 1035   Recent Labs    06/29/23 1035 07/19/23 1248 08/16/23 1218  HGB 11.7 12.4 11.8*    CMP     Component Value Date/Time   NA 137 03/10/2024 1022   NA 139 06/21/2014 0402   K 4.8 03/10/2024 1022   K 3.0 (L) 06/21/2014 0402   CL 98 03/10/2024 1022   CL 103 06/21/2014 0402   CO2 23 11/15/2023 1118   CO2 30 06/21/2014 0402   GLUCOSE 94 03/10/2024 1022   GLUCOSE 98 08/16/2023 1218   GLUCOSE 137 (H) 06/21/2014 0402   BUN 21 03/10/2024 1022   BUN 19 (H) 06/21/2014 0402   CREATININE 1.26 (H) 03/10/2024 1022   CREATININE 1.23 06/21/2014 0402   CALCIUM  10.1 03/10/2024 1022   CALCIUM  9.1 06/21/2014 0402   PROT 7.6 03/10/2024 1022   PROT 8.4 (H) 06/21/2014 0402   ALBUMIN 4.0 03/10/2024 1022   ALBUMIN 3.3 (L) 06/21/2014 0402   AST 27 03/10/2024 1022   AST 44 (H) 06/21/2014 0402   ALT 9 11/15/2023 1118   ALT 26 06/21/2014 0402   ALKPHOS 142 (H) 03/10/2024 1022   ALKPHOS 103 06/21/2014 0402   BILITOT 0.5 03/10/2024 1022   BILITOT 0.2 06/21/2014 0402   GFRNONAA 58 (L) 08/16/2023 1218   GFRNONAA 49 (L) 06/21/2014 0402   GFRAA 48 (L) 10/22/2018 1435   GFRAA 59 (L) 06/21/2014 0402      Latest Ref Rng & Units 03/10/2024   10:22 AM 11/15/2023   11:18 AM 08/16/2023   12:18 PM  Hepatic Function  Total Protein 6.0 - 8.5 g/dL 7.6  6.9  8.0   Albumin 3.9 - 4.9 g/dL 4.0  3.8  3.9   AST 0 - 40 IU/L 27  16  19    ALT 0 - 32 IU/L  9  16  Alk Phosphatase 49 - 135 IU/L 142  119  94   Total Bilirubin 0.0 - 1.2 mg/dL 0.5  0.3  0.5       Review of Systems   All systems reviewed and negative except where noted in HPI.    Physical Exam  BP 120/68   Pulse 68   Temp 98 F (36.7 C)   Ht 5' 3 (1.6 m)   Wt 282 lb (127.9 kg)   LMP 10/15/2019 Comment: Patient states there is no way she is pregnant.   SpO2 99%   BMI 49.95 kg/m  Patient's last menstrual period was 10/15/2019. General:   Alert and  oriented. Pleasant and cooperative. Well-nourished and well-developed. In no acute distress.  Head:  Normocephalic and atraumatic. Eyes:  Without icterus Ears:  Normal auditory acuity. Neck:  Supple; no masses or thyromegaly. Lungs:  Respirations even and unlabored.  Clear throughout to auscultation.   No wheezes, crackles, or rhonchi. No acute distress. Heart:  Regular rate and rhythm; no murmurs, clicks, rubs, or gallops. Abdomen:  Normal bowel sounds.  No bruits.  Soft, non-tender and non-distended without masses, hepatosplenomegaly or hernias noted.  No guarding or rebound tenderness.    Rectal:  Deferred. Msk:  Symmetrical without gross deformities. Normal posture. Extremities:  Without edema. Neurologic:  Alert and  oriented x4;  grossly normal neurologically. Skin:  Intact without significant lesions or rashes. Psych:  Alert and cooperative. Normal mood and affect.   Assessment & Plan   Leslie Bautista is a 63 y.o. female presenting today with concerns of rectal/vaginal fistula.   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  - Discussed with Dr. Jinny. Will order MRI of Pelvis with and without contrast.  - consider vaginography  - patient has also been referred to urogynecology by PCP. Patient to keep this appointment when scheduled.  Bloating - discussed SIBO testing but patient declined due to cost of test.  - stop omeprazole and continue with only pantoprazole  40 mg    Follow up in 2 months  Grayce Bohr, DNP, AGNP-C [facility]astroenter[facility]

## 2024-04-07 ENCOUNTER — Ambulatory Visit (INDEPENDENT_AMBULATORY_CARE_PROVIDER_SITE_OTHER): Admitting: Family Medicine

## 2024-04-07 ENCOUNTER — Encounter: Payer: Self-pay | Admitting: Family Medicine

## 2024-04-07 VITALS — BP 120/68 | HR 68 | Temp 98.0°F | Ht 63.0 in | Wt 282.0 lb

## 2024-04-07 DIAGNOSIS — N823 Fistula of vagina to large intestine: Secondary | ICD-10-CM

## 2024-04-07 DIAGNOSIS — R140 Abdominal distension (gaseous): Secondary | ICD-10-CM

## 2024-04-08 ENCOUNTER — Telehealth: Payer: Self-pay

## 2024-04-08 DIAGNOSIS — N823 Fistula of vagina to large intestine: Secondary | ICD-10-CM

## 2024-04-08 NOTE — Telephone Encounter (Signed)
 MRI Pelvis with and without contrast. Do not eat anything prior to the exam however you may have beverages. Please arrive @ 04/14/24 @ 8:30 am.

## 2024-04-09 ENCOUNTER — Other Ambulatory Visit: Payer: Self-pay | Admitting: Family Medicine

## 2024-04-09 ENCOUNTER — Ambulatory Visit: Attending: Pain Medicine | Admitting: Pain Medicine

## 2024-04-09 VITALS — BP 130/73 | HR 65 | Temp 97.3°F | Resp 16 | Ht 63.0 in | Wt 282.0 lb

## 2024-04-09 DIAGNOSIS — G608 Other hereditary and idiopathic neuropathies: Secondary | ICD-10-CM | POA: Insufficient documentation

## 2024-04-09 DIAGNOSIS — E1142 Type 2 diabetes mellitus with diabetic polyneuropathy: Secondary | ICD-10-CM | POA: Insufficient documentation

## 2024-04-09 DIAGNOSIS — M79672 Pain in left foot: Secondary | ICD-10-CM | POA: Diagnosis present

## 2024-04-09 DIAGNOSIS — R94130 Abnormal response to nerve stimulation, unspecified: Secondary | ICD-10-CM | POA: Diagnosis present

## 2024-04-09 DIAGNOSIS — R937 Abnormal findings on diagnostic imaging of other parts of musculoskeletal system: Secondary | ICD-10-CM | POA: Insufficient documentation

## 2024-04-09 DIAGNOSIS — M79671 Pain in right foot: Secondary | ICD-10-CM | POA: Insufficient documentation

## 2024-04-09 DIAGNOSIS — M79605 Pain in left leg: Secondary | ICD-10-CM | POA: Insufficient documentation

## 2024-04-09 DIAGNOSIS — M79604 Pain in right leg: Secondary | ICD-10-CM | POA: Insufficient documentation

## 2024-04-09 DIAGNOSIS — M79641 Pain in right hand: Secondary | ICD-10-CM | POA: Insufficient documentation

## 2024-04-09 DIAGNOSIS — G8929 Other chronic pain: Secondary | ICD-10-CM | POA: Insufficient documentation

## 2024-04-09 DIAGNOSIS — Z7985 Long-term (current) use of injectable non-insulin antidiabetic drugs: Secondary | ICD-10-CM

## 2024-04-09 DIAGNOSIS — M545 Low back pain, unspecified: Secondary | ICD-10-CM | POA: Insufficient documentation

## 2024-04-09 DIAGNOSIS — M79642 Pain in left hand: Secondary | ICD-10-CM | POA: Insufficient documentation

## 2024-04-09 DIAGNOSIS — E785 Hyperlipidemia, unspecified: Secondary | ICD-10-CM

## 2024-04-09 NOTE — Patient Instructions (Signed)
 ______________________________________________________________________    Patient information sheet: Qutenza   Capsaicin  Topical System - intended to be used only by healthcare providers.  Concentration of capsaicin  and the patches is 8% compared to maximum over-the-counter cream concentrations of 0.1%.  What is this medication?   CAPSAICIN  (cap SAY sin) treats nerve pain. It works by making your skin feel warm or cool, which blocks pain signals going to the brain. This medicine may be used for other purposes; ask your health care provider or pharmacist if you have questions.  COMMON BRAND NAME(S): Qutenza   What should I tell my care team before I take this medication? They need to know if you have any of these conditions: Have had a heart attack or stroke High blood pressure Large areas of burned or damaged skin An unusual or allergic reaction to capsaicin , hot peppers, other medications, foods, dyes, or preservatives Pregnant or trying to get pregnant Breastfeeding  How should I use this medication? This medication is for external use only. It is applied by your care team in a hospital or clinic setting. Talk to your care team about the use of this medication in children. Special care may be needed.  Overdosage: If you think you have taken too much of this medicine contact a poison control center or emergency room at once.  NOTE: This medicine is only for you. Do not share this medicine with others.  What if I miss a dose? This does not apply.  What may interact with this medication? Interactions are not expected. Do not use any other skin products on the affected area without asking your care team. This list may not describe all possible interactions. Give your health care provider a list of all the medicines, herbs, non-prescription drugs, or dietary supplements you use. Also tell them if you smoke, drink alcohol, or use illegal drugs. Some items may interact with your  medicine.  What should I watch for while using this medication? Your condition will be monitored carefully while you are receiving this medication. Tell your care team if your symptoms do not start to get better or if they get worse.  Talk to your care team about how to treat discomfort. You may place a cooling pack from the refrigerator (not the freezer) on the area. Do not place it directly on the skin.  Try not to touch the area where the patch was applied. If you do, wash your hands with soap and water right away. Your skin may be sensitive to heat for a few days after treatment. Avoid hot baths or showers, heating pads, and direct sunlight on the treated area.  What side effects may I notice from receiving this medication? Side effects that you should report to your care team as soon as possible: Allergic reactions--skin rash, itching, hives, swelling of the face, lips, tongue, or throat Burning, itching, crusting, or peeling of treated skin Increase in blood pressure Numbness, decrease in sense of touch or sensation  Side effects that usually do not require medical attention (report these to your care team if they continue or are bothersome): Mild skin irritation, redness, or dryness  This list may not describe all possible side effects. Call your doctor for medical advice about side effects. You may report side effects to FDA at 1-800-FDA-1088.  Where should I keep my medication? This medication is given in a hospital or clinic. It will not be stored at home.  NOTE: This sheet is a summary. It may not cover all possible  information. If you have questions about this medicine, talk to your doctor, pharmacist, or health care provider.   2024 Elsevier/Gold Standard (2023-04-20 00:00:00)  ______________________________________________________________________

## 2024-04-09 NOTE — Progress Notes (Signed)
 PROVIDER NOTE: Interpretation of information contained herein should be left to medically-trained personnel. Specific patient instructions are provided elsewhere under Patient Instructions section of medical record. This document was created in part using AI and STT-dictation technology, any transcriptional errors that may result from this process are unintentional.  Patient: Leslie Bautista  Service: E/M   PCP: [name] Curtis LABOR, FNP  DOB: [birth year]  DOS: 04/09/2024  Provider: Eric LABOR Como, MD  MRN: [id number]  Delivery: Face-to-face  Specialty: Interventional Pain Management  Type: Established Patient  Setting: Ambulatory outpatient facility  Specialty designation: 09  Referring Prov.: [name] Curtis LABOR, FNP  Location: Outpatient office facility       Primary Reason(s) for Visit: Encounter for evaluation before starting new chronic pain management plan of care (Level of risk: moderate) CC: Back Pain (Right ) and Knee Pain (Bilateral, pain will come and go)  HPI  Leslie Bautista is a 63 y.o. year old, female patient, who comes today for a follow-up evaluation to review the test results and decide on a treatment plan. She has Postmenopausal bleeding; Class 3 severe obesity without serious comorbidity with body mass index (BMI) of 50.0 to 59.9 in adult [facility]); Depression; Pre-diabetes; Primary localized osteoarthrosis, lower leg; Dyspareunia due to medical condition in female; Endometrial thickening on ultrasound; Lower urinary tract infectious disease; Myocardial injury; Obesity, Class III, BMI 40-49.9 (morbid obesity) (HCC); Breast cancer (HCC); Hypertension, benign; HLD (hyperlipidemia); OSA on CPAP; Chemotherapy-induced neutropenia; Thrombocytopenia; Rectal vaginal fistula; Tenesmus (rectal); Chronic lower extremity pain (1ry area of Pain) (Bilateral); Acute cough; Need for hepatitis C screening test; Screening for deficiency anemia; Encounter to establish care with new doctor; Neuropathy; Hx of  colonic polyps; Polyp of ascending colon; CKD stage G3a/A1, 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); Hypersensitivity reaction; Invasive ductal carcinoma of breast, left (HCC); Diabetes mellitus type II, non insulin dependent (HCC); Cervical osteophyte; Chemotherapy-induced neuropathy; Oropharyngeal dysphagia; Severe obesity (HCC); Chronic pain syndrome; Pharmacologic therapy; Disorder of skeletal system; Problems influencing health status; Psychophysiological insomnia; Chronic feet pain (Bilateral); Chronic and pain (Bilateral); Numbness and tingling in both hands; Non-articular hand pain (Bilateral); Multifactorial peripheral neuropathy; Sensory peripheral neuropathy; Abnormal NCS (nerve conduction studies) (11/07/2023); Diabetic sensorimotor polyneuropathy (HCC); Polyneuropathy, peripheral sensorimotor axonal; Chronic low back pain (2ry area of Pain) (Right) w/o sciatica; Lumbar facet joint pain; and Abnormal MRI, cervical spine (01/15/2023) on their problem list. Her primarily concern today is the Back Pain (Right ) and Knee Pain (Bilateral, pain will come and go)  Pain Assessment: Location: Right Back Radiating: right hip/buttocks Onset: More than a month ago Duration: Chronic pain Quality: Aching, Discomfort Severity: 1 /10 (subjective, self-reported pain score)  Effect on ADL: limits ADLs, and walking will get in motorized carts at stores Timing: Intermittent (when walking long distances) Modifying factors:   BP: 130/73  HR: 65  Ms. Deutschman comes in today for a follow-up visit after her initial evaluation on 03/10/2024. Today we went over the results of her tests. These were explained in Layman's terms. During today's appointment we went over my diagnostic impression, as well as the proposed treatment plan.  Review of initial evaluation (03/10/2024): Leslie Bautista is a 63 year old female with peripheral neuropathy and diabetes who presents with pain and tingling in her feet  and hands.   She experiences pain and tingling in her feet and hands, which began last year following chemotherapy for breast cancer. The neuropathy affects all fingers, with recent issues of trigger finger in the ring fingers.  She received injections three weeks ago, which provided some relief, but the fingers still 'pop and lock'.   The pain in her feet is constant and bilateral, described as a sharp, cutting sensation, accompanied by numbness. It interferes with her ability to walk for extended periods, causing aching. She takes gabapentin , 300 mg in the morning and 900 mg at night, and nortriptyline, 10 mg in the morning and 40 mg at night, for neuropathy, with no side effects except daytime sleepiness, which she manages with short naps.   She has type 2 diabetes, diagnosed this year, managed with Rebell[employer], keeping her blood sugar under control. Her blood sugar was 150 mg/dL this morning before breakfast. No history of diabetic ketoacidosis or coma.   Her daughter has observed that she appears 'too stiff' and walks sideways, though she has not noticed this herself.  Review of diagnostic test ordered on 03/10/2024:  Diagnostic lab work: Lab work demonstrated elevated CRP at 20 mg/L (normal 0-10), elevated serum creatinine of 1.26 mg/dL (normal 9.42-8.99) decreased eGFR of 48 mL/min per 1.73 (normal more than 59) elevated alkaline phosphatase of 142 international units/L (normal 49-135).  Elevated hemoglobin A1c of 6.0 and elevated sed rate of 53 mm/h (normal 0-40).  Vitamin D levels were within normal limits and UDS did not demonstrate any illicit substances.  Magnesium levels and vitamin B12 levels were both within normal limits. Diagnostic imaging: Diagnostic x-rays of the lumbar spine with bending views demonstrated no acute abnormality of the lumbar spine.  It did show a 3 mm anterolisthesis of L3 over L4 likely degenerative in nature.  No abnormal motion with flexion or extension to suggest  ligamental instability.  In addition there is evidence of facet arthrosis of L3-S1.  Discussed the use of AI scribe software for clinical note transcription with the patient, who gave verbal consent to proceed.  History of Present Illness   Arienna Benegas is a 63 year old female with diabetes who presents with peripheral neuropathy in her hands and feet.  Nerve conduction tests confirm sensory polyneuropathy in the upper extremities and severe sensory motor polyneuropathy in the lower extremities. She experiences back pain, with lumbar x-ray showing a 3 mm anterolisthesis of L3 over L4 and multilevel facet arthrosis from L3 through S1. Recent lab work shows elevated CRP, serum creatinine, alkaline phosphatase, hemoglobin A1c, and sed rate, with decreased GFR.      Patient presented with interventional treatment options. Ms. Sandles was informed that I will not be providing medication management. Pharmacotherapy evaluation including recommendations may be offered, if specifically requested.   Controlled Substance Pharmacotherapy Assessment REMS (Risk Evaluation and Mitigation Strategy)  Opioid Analgesic: None MME/day: 0 mg/day   Pill Count: None expected due to no prior prescriptions written by our practice. Dorlene Montie FALCON, RN  04/09/2024 10:05 AM  Sign when Signing Visit Safety precautions to be maintained throughout the outpatient stay will include: orient to surroundings, keep bed in low position, maintain call bell within reach at all times, provide assistance with transfer out of bed and ambulation.     Pharmacokinetics: Liberation and absorption (onset of action): WNL Distribution (time to peak effect): WNL Metabolism and excretion (duration of action): WNL         Pharmacodynamics: Desired effects: Analgesia: Ms. Sofia reports >50% benefit. Functional ability: Patient reports that medication allows her to accomplish basic ADLs Clinically meaningful improvement in function  (CMIF): Sustained CMIF goals met Perceived effectiveness: Described as relatively effective, allowing for increase in activities  of daily living (ADL) Undesirable effects: Side-effects or Adverse reactions: None reported Monitoring: [place] PMP: PDMP reviewed during this encounter. Online review of the past 32-month period previously conducted. Not applicable at this point since we have not taken over the patient's medication management yet. List of other Serum/Urine Drug Screening Test(s):  Lab Results  Component Value Date   COCAINSCRNUR NONE DETECTED 12/02/2022   THCU NONE DETECTED 12/02/2022   List of all UDS test(s) done:  Lab Results  Component Value Date   SUMMARY FINAL 03/10/2024   Last UDS on record: Summary  Date Value Ref Range Status  03/10/2024 FINAL  Final    Comment:    ==================================================================== Compliance Drug Analysis, Ur ==================================================================== Test                             Result       Flag       Units  Drug Present and Declared for Prescription Verification   Gabapentin                      PRESENT      EXPECTED   Trazodone                       PRESENT      EXPECTED   1,3 chlorophenyl piperazine    PRESENT      EXPECTED    1,3-chlorophenyl piperazine is an expected metabolite of trazodone .    Duloxetine                     PRESENT      EXPECTED   Nortriptyline                  PRESENT      EXPECTED    Nortriptyline may be administered as a prescription drug; it is also    an expected metabolite of amitriptyline.  Drug Absent but Declared for Prescription Verification   Cyclobenzaprine                 Not Detected UNEXPECTED ==================================================================== Test                      Result    Flag   Units      Ref Range   Creatinine              131              mg/dL       >=79 ==================================================================== Declared Medications:  The flagging and interpretation on this report are based on the  following declared medications.  Unexpected results may arise from  inaccuracies in the declared medications.   **Note: The testing scope of this panel includes these medications:   Cyclobenzaprine  (Flexeril )  Duloxetine  Gabapentin   Nortriptyline (Pamelor)  Trazodone    **Note: The testing scope of this panel does not include the  following reported medications:   Anastrozole (Arimidex)  Atorvastatin  (Lipitor)  Hydrochlorothiazide (Hyzaar)  Losartan  (Hyzaar)  Meloxicam  (Mobic )  Omeprazole (Prilosec)  Semaglutide  (Rybelsus ) ==================================================================== For clinical consultation, please call [phone]. ====================================================================    UDS interpretation: No unexpected findings.          Medication Assessment Form: Not applicable. No opioids. Treatment compliance: Not applicable Risk Assessment Profile: Aberrant behavior: See initial evaluations. None observed or detected today Comorbid factors increasing risk of overdose: See initial evaluation.  No additional risks detected today Opioid risk tool (ORT):     04/09/2024   10:12 AM  Opioid Risk   Alcohol 0  Illegal Drugs 0  Rx Drugs 0  Age between 16-45 years  0  Psychological Disease 0  Opioid Risk Tool Scoring 0  Opioid Risk Interpretation Low Risk    ORT Scoring interpretation table:  Score <3 = Low Risk for SUD  Score between 4-7 = Moderate Risk for SUD  Score >8 = High Risk for Opioid Abuse   Risk of substance use disorder (SUD): Low  Risk Mitigation Strategies:  Patient opioid safety counseling: No controlled substances prescribed. Patient-Prescriber Agreement (PPA): No agreement signed.  Controlled substance notification to other providers: None required. No  opioid therapy.  Pharmacologic Plan: Non-opioid analgesic therapy offered. Interventional alternatives discussed.             Laboratory Chemistry Profile   Renal Lab Results  Component Value Date   BUN 21 03/10/2024   CREATININE 1.26 (H) 03/10/2024   BCR 17 03/10/2024   GFRAA 48 (L) 10/22/2018   GFRNONAA 58 (L) 08/16/2023   PROTEINUR NEGATIVE 08/16/2023     Electrolytes Lab Results  Component Value Date   NA 137 03/10/2024   K 4.8 03/10/2024   CL 98 03/10/2024   CALCIUM  10.1 03/10/2024   MG 2.1 03/10/2024   PHOS 3.3 12/05/2022     Hepatic Lab Results  Component Value Date   AST 27 03/10/2024   ALT 9 11/15/2023   ALBUMIN 4.0 03/10/2024   ALKPHOS 142 (H) 03/10/2024   LIPASE 22 08/16/2023     ID Lab Results  Component Value Date   HIV Non Reactive 12/03/2022   SARSCOV2NAA NEGATIVE 07/19/2023     Bone Lab Results  Component Value Date   25OHVITD1 32 03/10/2024   25OHVITD2 24 03/10/2024   25OHVITD3 7.8 03/10/2024     Endocrine Lab Results  Component Value Date   GLUCOSE 94 03/10/2024   GLUCOSEU NEGATIVE 08/16/2023   HGBA1C 6.0 (H) 03/10/2024     Neuropathy Lab Results  Component Value Date   VITAMINB12 382 03/10/2024   HGBA1C 6.0 (H) 03/10/2024   HIV Non Reactive 12/03/2022     CNS No results found for: COLORCSF, APPEARCSF, RBCCOUNTCSF, WBCCSF, POLYSCSF, LYMPHSCSF, EOSCSF, PROTEINCSF, GLUCCSF, JCVIRUS, CSFOLI, IGGCSF, LABACHR, ACETBL   Inflammation (CRP: Acute  ESR: Chronic) Lab Results  Component Value Date   CRP 20 (H) 03/10/2024   ESRSEDRATE 53 (H) 03/10/2024     Rheumatology No results found for: RF, ANA, LABURIC, URICUR, LYMEIGGIGMAB, LYMEABIGMQN, HLAB27   Coagulation Lab Results  Component Value Date   INR 1.2 12/04/2021   LABPROT 14.6 12/04/2021   APTT 27 12/04/2021   PLT 171 08/16/2023     Cardiovascular Lab Results  Component Value Date   CKTOTAL 97 06/21/2014   CKMB 0.8 06/21/2014    TROPONINI < 0.02 06/21/2014   HGB 11.8 (L) 08/16/2023   HCT 36.6 08/16/2023     Screening Lab Results  Component Value Date   SARSCOV2NAA NEGATIVE 07/19/2023   HIV Non Reactive 12/03/2022     Cancer No results found for: CEA, CA125, LABCA2   Allergens No results found for: ALMOND, APPLE, ASPARAGUS, AVOCADO, BANANA, BARLEY, BASIL, BAYLEAF, GREENBEAN, LIMABEAN, WHITEBEAN, BEEFIGE, REDBEET, BLUEBERRY, BROCCOLI, CABBAGE, MELON, CARROT, CASEIN, CASHEWNUT, CAULIFLOWER, CELERY     Note: Lab results reviewed.  Recent Diagnostic Imaging Review  Lumbosacral Imaging: Lumbar DG Bending views: Results for orders placed during the  hospital encounter of 03/14/24 DG Lumbar Spine Complete W/Bend  Narrative EXAM: 6 OR MORE VIEW(S) XRAY OF THE LUMBAR SPINE 03/14/2024 10:07:00 AM  COMPARISON: None available.  CLINICAL HISTORY: Low back pain. Chronic low back pain that radiates into right buttocks; No known trauma  FINDINGS:  LUMBAR SPINE:  BONES: No acute fracture.  DISCS AND DEGENERATIVE CHANGES: 3 mm anterolisthesis L3-L4, likely degenerative in nature. Vertebral body height and intervertebral disc heights are preserved. Facet arthrosis of L3-S1 is present, not well profiled on this examination.  SOFT TISSUES: No acute abnormality.  IMPRESSION: 1. No acute abnormality of the lumbar spine. 2. 3 mm anterolisthesis L3-4, likely degenerative in nature. No abnormal motion with flexion and extension to suggest ligamentous instability. 3. Facet arthrosis of L3-S1, not well profiled on this examination.  Electronically signed by: Dorethia Molt MD 03/17/2024 01:18 AM EDT RP Workstation: [id number]  Hip Imaging: Hip-R DG 2-3 views: Results for orders placed during the hospital encounter of 11/16/23 DG HIP UNILAT W OR W/O PELVIS 2-3 VIEWS RIGHT  Narrative CLINICAL DATA:  Hip pain  EXAM: DG HIP (WITH OR WITHOUT PELVIS) 2-3V  RIGHT  COMPARISON:  None Available.  FINDINGS: Mild SI joint degenerative change. Pubic symphysis and rami appear intact. No acute fracture or malalignment. Joint space appears patent.  IMPRESSION: No acute osseous abnormality   Electronically Signed By: Luke Bun M.D. On: 11/23/2023 20:11  Ankle Imaging: Ankle-R DG Complete: Results for orders placed during the hospital encounter of 02/23/17 DG Ankle Complete Right  Narrative CLINICAL DATA:  Clemens several weeks ago. Persistent pain with weight-bearing.  EXAM: RIGHT ANKLE - COMPLETE 3+ VIEW  COMPARISON:  None.  FINDINGS: Somewhat unusual linear calcification projected just at the anterior edge of the ankle joint on the lateral view, possibly relates to the tibial fibular articulation. I suspect of this is chronic but cannot rule out the possibility of an avulsion fracture. Medial and lateral malleolus do not show recent injury. There are prominent calcaneal spurs.  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 Less likely avulsion fracture. Is the patient tender in that location?   Electronically Signed By: Oneil Officer M.D. On: 02/23/2017 11:58  Foot Imaging: Foot-R DG Complete: Results for orders placed in visit on 09/19/21 DG Foot Complete Right  Narrative Please see detailed radiograph report in office note.  Foot-L DG Complete: Results for orders placed in visit on 07/24/18 DG Foot Complete Left  Narrative Please see detailed radiograph report in office note.  Complexity Note: Imaging results reviewed.                         Meds   Current Outpatient Medications:    anastrozole (ARIMIDEX) 1 MG tablet, Take 1 mg by mouth daily., Disp: , Rfl:    atorvastatin  (LIPITOR) 20 MG tablet, Take 20 mg by mouth at bedtime., Disp: , Rfl:    DULoxetine (CYMBALTA) 30 MG capsule,  Take 30 mg by mouth daily., Disp: , Rfl:    gabapentin  (NEURONTIN ) 300 MG capsule, Take one 300mg  capsule in the morning and at night take one 300 mg tablet plus 600 tablet., Disp: 180 capsule, Rfl: 2   gabapentin  (NEURONTIN ) 600 MG tablet, Take 600 mg by mouth 2 (two) times daily., Disp: , Rfl:    losartan -hydrochlorothiazide (HYZAAR) 50-12.5 MG tablet, TAKE 1 TABLET BY MOUTH EVERY DAY, Disp:  90 tablet, Rfl: 1   meloxicam  (MOBIC ) 7.5 MG tablet, Take 7.5 mg by mouth 2 (two) times daily., Disp: , Rfl:    nortriptyline (PAMELOR) 10 MG capsule, Take 20 mg by mouth at bedtime., Disp: , Rfl:    omeprazole (PRILOSEC) 20 MG capsule, Take 20 mg by mouth every morning., Disp: , Rfl:    pantoprazole  (PROTONIX ) 40 MG tablet, Take 40 mg by mouth daily., Disp: , Rfl:    Semaglutide  (RYBELSUS ) 7 MG TABS, Take 1 tablet (7 mg total) by mouth daily., Disp: 90 tablet, Rfl: 1   traZODone  (DESYREL ) 50 MG tablet, Take 1 tablet (50 mg total) by mouth at bedtime., Disp: 90 tablet, Rfl: 1  ROS  Constitutional: Denies any fever or chills Gastrointestinal: No reported hemesis, hematochezia, vomiting, or acute GI distress Musculoskeletal: Denies any acute onset joint swelling, redness, loss of ROM, or weakness Neurological: No reported episodes of acute onset apraxia, aphasia, dysarthria, agnosia, amnesia, paralysis, loss of coordination, or loss of consciousness  Allergies  Ms. Sawyers is allergic to metformin  and related and oxycontin  [oxycodone  hcl].  PFSH  Drug: Ms. Bachtell  reports no history of drug use. Alcohol:  reports that she does not currently use alcohol. Tobacco:  reports that she has never smoked. She has never used smokeless tobacco. Medical:  has a past medical history of Allergy, Cancer (HCC), Cataract, Depression, Diabetes mellitus without complication (HCC), Hypertension, Morbid obesity with BMI of 50.0-59.9, adult (HCC), Obstructive sleep apnea, Osteoarthritis of both knees, Pre-diabetes, and Sleep  apnea. Surgical: Ms. Mumaw  has a past surgical history that includes Total knee arthroplasty (Right); Tubal ligation; Cataract extraction, bilateral (Bilateral, 2022); Colonoscopy (2015); Ankle surgery (Right); Tonsillectomy (Bilateral, 06/14/2021); Breast surgery; Eye surgery; Joint replacement (Right); Colonoscopy with propofol  (N/A, 07/26/2023); and polypectomy (07/26/2023). Family: family history includes Hypertension in her brother and sister; Pancreatic cancer in her mother; Prostate cancer in her father.  Constitutional Exam  General appearance: Well nourished, well developed, and well hydrated. In no apparent acute distress Vitals:   04/09/24 1005  BP: 130/73  Pulse: 65  Resp: 16  Temp: (!) 97.3 F (36.3 C)  SpO2: 97%  Weight: 282 lb (127.9 kg)  Height: 5' 3 (1.6 m)   BMI Assessment: Estimated body mass index is 49.95 kg/m as calculated from the following:   Height as of this encounter: 5' 3 (1.6 m).   Weight as of this encounter: 282 lb (127.9 kg).  BMI interpretation table: BMI level Category Range association with higher incidence of chronic pain  <18 kg/m2 Underweight   18.5-24.9 kg/m2 Ideal body weight   25-29.9 kg/m2 Overweight Increased incidence by 20%  30-34.9 kg/m2 Obese (Class I) Increased incidence by 68%  35-39.9 kg/m2 Severe obesity (Class II) Increased incidence by 136%  >40 kg/m2 Extreme obesity (Class III) Increased incidence by 254%   Patient's current BMI Ideal Body weight  Body mass index is 49.95 kg/m. Ideal body weight: 52.4 kg (115 lb 8.3 oz) Adjusted ideal body weight: 82.6 kg (182 lb 1.8 oz)   BMI Readings from Last 4 Encounters:  04/09/24 49.95 kg/m  04/07/24 49.95 kg/m  03/26/24 49.35 kg/m  03/10/24 48.18 kg/m   Wt Readings from Last 4 Encounters:  04/09/24 282 lb (127.9 kg)  04/07/24 282 lb (127.9 kg)  03/26/24 278 lb 9.6 oz (126.4 kg)  03/10/24 272 lb (123.4 kg)    Psych/Mental status: Alert, oriented x 3 (person, place, &  time)       Eyes:  PERLA Respiratory: No evidence of acute respiratory distress  Assessment & Plan  Primary Diagnosis & Pertinent Problem List: The primary encounter diagnosis was Chronic lower extremity pain (1ry area of Pain) (Bilateral). Diagnoses of Chronic low back pain (2ry area of Pain) (Right) w/o sciatica, Chronic feet pain (Bilateral), Chronic and pain (Bilateral), Diabetic sensorimotor polyneuropathy (HCC), Abnormal NCS (nerve conduction studies) (11/07/2023), Polyneuropathy, peripheral sensorimotor axonal, and Sensory peripheral neuropathy were also pertinent to this visit. Visit Diagnosis: 1. Chronic lower extremity pain (1ry area of Pain) (Bilateral)   2. Chronic low back pain (2ry area of Pain) (Right) w/o sciatica   3. Chronic feet pain (Bilateral)   4. Chronic and pain (Bilateral)   5. Diabetic sensorimotor polyneuropathy (HCC)   6. Abnormal NCS (nerve conduction studies) (11/07/2023)   7. Polyneuropathy, peripheral sensorimotor axonal   8. Sensory peripheral neuropathy    Problems updated and reviewed during this visit: Problem  Abnormal MRI, cervical spine (01/15/2023)   (01/15/2023) CERVICAL SPINE MRI FINDINGS: Evaluation is limited secondary to patient motion artifact. Bandlike low signal from the level of C6-T1 is favored to be artifactual secondary to magnetic field inhomogeneity.  Bone marrow signal intensity is mildly heterogeneous. Large anterior osteophytes from C4-C6. Normal signal in the spinal cord.  The vertebral bodies are normally aligned. Mild multilevel disc desiccation.      C2-C3: Right facet arthropathy. Small central disc osteophyte complex. No spinal canal narrowing. No neural foraminal narrowing.  C3-C4: Bilateral uncovertebral hypertrophy. No spinal canal narrowing. Mild left neural foraminal narrowing.  C4-C5: Disc osteophyte complex and uncovertebral hypertrophy. Mild spinal canal narrowing. Mild bilateral neural foraminal narrowing.  C5-C6:  Slight uncovertebral spurring and left facet arthropathy. No spinal canal narrowing. Mild right neural foraminal narrowing.  C6-C7: No spinal canal narrowing. No neural foraminal narrowing.  C7-T1: No spinal canal narrowing. No neural foraminal narrowing.  The paraspinal tissues are within normal limits.  IMPRESSION: Mild multilevel degenerative changes of the cervical spine as characterized within the body of the report, most prominently at C4-C5 with mild spinal canal narrowing and mild bilateral foraminal narrowing.     Plan of Care  Assessment and Plan    Sensorimotor polyneuropathy of upper and lower extremities (diabetic and idiopathic)   Sensorimotor polyneuropathy affects her hands and feet, with severe sensory motor polyneuropathy in the lower extremities. This condition is likely due to diabetes and possibly worsened by previous chemotherapy. Nerve conduction tests confirm sensory polyneuropathy in the upper extremities and severe sensory motor polyneuropathy in the lower extremities. Initiated Qutenza  treatment with capsaicin  patches for her feet. Will attempt to obtain insurance approval for Qutenza  treatment for her hands.  Low back pain with lumbar spondylolisthesis and multilevel facet arthrosis   She experiences low back pain associated with a 3 mm anterolisthesis of L3 over L4 and multilevel facet arthrosis from L3 to S1. Surgery is not indicated. Facet joint arthritis likely contributes to the pain, exacerbated by the weight-bearing nature of the lower spine and the spondylolisthesis. Will schedule a diagnostic lumbar block with numbing medicine and steroid to identify the pain source. IV sedation will be administered for the first lumbar block procedure, requiring a driver. Will evaluate response to the lumbar block to determine a long-term management plan.        Pharmacotherapy (Medications Ordered): No orders of the defined types were placed in this  encounter.  Procedure Orders         NEUROLYSIS     Orders Placed This Encounter  Procedures   NEUROLYSIS    Please order Qutenza patches    Standing Status:   Future    Expiration Date:   07/10/2024    Where will this procedure be performed?:   [facility]   Lab Orders  No laboratory test(s) ordered today   Imaging Orders  No imaging studies ordered today   Referral Orders  No referral(s) requested today    Pharmacological management:  Opioid Analgesics: I will not be prescribing any opioids at this time Membrane stabilizer: I will not be prescribing any at this time Muscle relaxant: I will not be prescribing any at this time NSAID: I will not be prescribing any at this time Other analgesic(s): I will not be prescribing any at this time      Interventional Therapies  Risk Factors  Considerations  Medical Comorbidities:  MO (BMI>45)  Hx. Breast CA  Stage 3a CKD  T2NIDDM  HTN  OSA w/ CPAP  Thrombocytopenia     Planned  Pending:   Therapeutic bilateral lower extremity Qutenza treatment #1    Under consideration:   Therapeutic bilateral lower extremity Qutenza treatment #1  Possible Therapeutic bilateral upper extremity Qutenza treatment #1  Diagnostic right sided lumbar facet MBB #1 (L3-S1)    Completed: (Analgesic benefit)1  None at this time   Therapeutic  Palliative (PRN) options:   None established   Completed by other providers:   None reported  1(Analgesic benefit): Expressed in percentage (%). (Local anesthetic[LA] +/- sedation  L.A.Local Anesthetic  Steroid benefit  Ongoing benefit)      Provider-requested follow-up: Return for (Clinic): (B) LE Qutenza Tx #1. Recent Visits Date Type Provider Dept  03/10/24 Office Visit Tanya Glisson, MD [facility]  Showing recent visits within past 90 days and meeting all other requirements Today's Visits Date Type Provider Dept  04/09/24 Office Visit Tanya Glisson, MD  [facility]  Showing today's visits and meeting all other requirements Future Appointments No visits were found meeting these conditions. Showing future appointments within next 90 days and meeting all other requirements   Primary Care Physician: [name] Curtis LABOR, FNP  Duration of encounter: 30 minutes.  Total time on encounter, as per AMA guidelines included both the face-to-face and non-face-to-face time personally spent by the physician and/or other qualified health care professional(s) on the day of the encounter (includes time in activities that require the physician or other qualified health care professional and does not include time in activities normally performed by clinical staff). Physician's time may include the following activities when performed: Preparing to see the patient (e.g., pre-charting review of records, searching for previously ordered imaging, lab work, and nerve conduction tests) Review of prior analgesic pharmacotherapies. Reviewing PMP Interpreting ordered tests (e.g., lab work, imaging, nerve conduction tests) Performing post-procedure evaluations, including interpretation of diagnostic procedures Obtaining and/or reviewing separately obtained history Performing a medically appropriate examination and/or evaluation Counseling and educating the patient/family/caregiver Ordering medications, tests, or procedures Referring and communicating with other health care professionals (when not separately reported) Documenting clinical information in the electronic or other health record Independently interpreting results (not separately reported) and communicating results to the patient/ family/caregiver Care coordination (not separately reported)  Note by: Glisson LABOR Tanya, MD (TTS technology used. I apologize for any typographical errors that were not detected and corrected.) Date: 04/09/2024; Time: 10:45 AM

## 2024-04-09 NOTE — Progress Notes (Signed)
 Safety precautions to be maintained throughout the outpatient stay will include: orient to surroundings, keep bed in low position, maintain call bell within reach at all times, provide assistance with transfer out of bed and ambulation.

## 2024-04-09 NOTE — Telephone Encounter (Signed)
 Copied from CRM [id number]. Topic: Clinical - Medication Refill >> Apr 09, 2024 11:26 AM Tinnie C wrote: Medication:  atorvastatin  (LIPITOR) 20 MG tablet  Has the patient contacted their pharmacy? No This was prescribed by historical prov. She is no longer seeing them and would like this to now be prescribed by Kellie.  This is the patient's preferred pharmacy:  [facility] [address], [place] - [address] [place] [address] Phone: [phone] Fax: [phone]  Is this the correct pharmacy for this prescription? Yes If no, delete pharmacy and type the correct one.   Has the prescription been filled recently? No  Is the patient out of the medication? Yes  Has the patient been seen for an appointment in the last year OR does the patient have an upcoming appointment? Yes, last seen 03/26/24  Can we respond through MyChart? Please call [phone]  Agent: Please be advised that Rx refills may take up to 3 business days. We ask that you follow-up with your pharmacy.

## 2024-04-10 ENCOUNTER — Ambulatory Visit: Payer: Self-pay | Admitting: Family Medicine

## 2024-04-10 ENCOUNTER — Ambulatory Visit
Admission: RE | Admit: 2024-04-10 | Discharge: 2024-04-10 | Disposition: A | Discharge status: Home / Self Care | Source: Ambulatory Visit | Attending: Family Medicine | Admitting: Family Medicine

## 2024-04-10 DIAGNOSIS — R140 Abdominal distension (gaseous): Secondary | ICD-10-CM

## 2024-04-11 ENCOUNTER — Ambulatory Visit: Admitting: Occupational Therapy

## 2024-04-11 ENCOUNTER — Encounter: Payer: Self-pay | Admitting: Occupational Therapy

## 2024-04-11 DIAGNOSIS — M25641 Stiffness of right hand, not elsewhere classified: Secondary | ICD-10-CM | POA: Diagnosis not present

## 2024-04-11 DIAGNOSIS — R208 Other disturbances of skin sensation: Secondary | ICD-10-CM

## 2024-04-11 DIAGNOSIS — M25642 Stiffness of left hand, not elsewhere classified: Secondary | ICD-10-CM

## 2024-04-11 NOTE — Telephone Encounter (Signed)
 Requested medication (s) are due for refill today: yes  Requested medication (s) are on the active medication list: yes  Last refill:  06/06/21  Future visit scheduled: no  Notes to clinic:  historical medication     Requested Prescriptions  Pending Prescriptions Disp Refills   atorvastatin  (LIPITOR) 20 MG tablet      Sig: Take 1 tablet (20 mg total) by mouth at bedtime.     Cardiovascular:  Antilipid - Statins Failed - 04/11/2024  2:26 PM      Failed - Lipid Panel in normal range within the last 12 months    Cholesterol, Total  Date Value Ref Range Status  06/29/2023 143 100 - 199 mg/dL Final   LDL Chol Calc (NIH)  Date Value Ref Range Status  06/29/2023 63 0 - 99 mg/dL Final   HDL  Date Value Ref Range Status  06/29/2023 68 >39 mg/dL Final   Triglycerides  Date Value Ref Range Status  06/29/2023 59 0 - 149 mg/dL Final         Passed - Patient is not pregnant      Passed - Valid encounter within last 12 months    Recent Outpatient Visits           2 weeks ago Rectal vaginal fistula   [place] [facility] [place], Curtis A, FNP   1 month ago Type 2 diabetes mellitus with stage 3a chronic kidney disease, without long-term current use of insulin (HCC)   [place] [facility] [place], Wagener A, FNP   4 months ago Type 2 diabetes mellitus with stage 3a chronic kidney disease, without long-term current use of insulin (HCC)   [facility] [place], Curtis A, FNP   6 months ago Diabetes mellitus without complication [facility])   [place] [facility] [place] Nancyann BRAVO, MD   9 months ago Rectal vaginal fistula   [place] [facility] [place], Curtis LABOR, FNP       Future Appointments             In 1 month Celestia Rima, NP [facility] [place] [facility]

## 2024-04-11 NOTE — Therapy (Signed)
 OUTPATIENT OCCUPATIONAL THERAPY ORTHO TREATMENT  Patient Name: Leslie Bautista MRN: [id number] DOB:05/01/1961, 63 y.o., female Today's Date: 04/11/2024  PCP: [name] NP REFERRING PROVIDER: Dr Kathlynn  END OF SESSION:  OT End of Session - 04/11/24 0904     Visit Number 14    Number of Visits 16    Date for Recertification  05/13/24    OT Start Time 0905    OT Stop Time 0945    OT Time Calculation (min) 40 min    Activity Tolerance Patient tolerated treatment well    Behavior During Therapy WFL for tasks assessed/performed             Past Medical History:  Diagnosis Date   Allergy    Cancer (HCC)    breast cancer on left side   Cataract    Depression    Diabetes mellitus without complication (HCC)    Hypertension    Morbid obesity with BMI of 50.0-59.9, adult (HCC)    Obstructive sleep apnea    Osteoarthritis of both knees    Pre-diabetes    Sleep apnea    Past Surgical History:  Procedure Laterality Date   ANKLE SURGERY Right    ligament repair   BREAST SURGERY     CATARACT EXTRACTION, BILATERAL Bilateral 2022   COLONOSCOPY  2015   COLONOSCOPY WITH PROPOFOL  N/A 07/26/2023   Procedure: COLONOSCOPY WITH PROPOFOL ;  Surgeon: Unk Corinn Skiff, MD;  Location: [facility];  Service: Gastroenterology;  Laterality: N/A;   EYE SURGERY     JOINT REPLACEMENT Right    Knee   POLYPECTOMY  07/26/2023   Procedure: POLYPECTOMY;  Surgeon: Unk Corinn Skiff, MD;  Location: [facility];  Service: Gastroenterology;;   TONSILLECTOMY Bilateral 06/14/2021   Procedure: TONSILLECTOMY;  Surgeon: Milissa Hamming, MD;  Location: [facility];  Service: ENT;  Laterality: Bilateral;   TOTAL KNEE ARTHROPLASTY Right    TUBAL LIGATION     Patient Active Problem List   Diagnosis Date Noted   Abnormal MRI, cervical spine (01/15/2023) 04/09/2024   Chronic feet pain (Bilateral) 03/10/2024   Chronic and pain (Bilateral) 03/10/2024   Numbness and tingling in both hands 03/10/2024    Non-articular hand pain (Bilateral) 03/10/2024   Multifactorial peripheral neuropathy 03/10/2024   Sensory peripheral neuropathy 03/10/2024   Abnormal NCS (nerve conduction studies) (11/07/2023) 03/10/2024   Diabetic sensorimotor polyneuropathy (HCC) 03/10/2024   Polyneuropathy, peripheral sensorimotor axonal 03/10/2024   Chronic low back pain (2ry area of Pain) (Right) w/o sciatica 03/10/2024   Lumbar facet joint pain 03/10/2024   Psychophysiological insomnia 02/19/2024   Chronic pain syndrome 02/04/2024   Pharmacologic therapy 02/04/2024   Disorder of skeletal system 02/04/2024   Problems influencing health status 02/04/2024   Diabetes mellitus type II, non insulin dependent (HCC) 09/25/2023   Cervical osteophyte 08/23/2023   Oropharyngeal dysphagia 08/23/2023   Chemotherapy-induced neuropathy 08/02/2023   Hx of colonic polyps 07/26/2023   Polyp of ascending colon 07/26/2023   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   Tenesmus (rectal) 06/29/2023   Chronic lower extremity pain (1ry area of Pain) (Bilateral) 06/29/2023   Acute cough 06/29/2023   Need for hepatitis C screening test 06/29/2023   Screening for deficiency anemia 06/29/2023   Encounter to establish care with new doctor 06/29/2023   Neuropathy 06/29/2023   Chemotherapy-induced neutropenia 12/05/2022   Thrombocytopenia 12/05/2022   Lower urinary tract infectious disease 12/03/2022   Myocardial injury 12/03/2022   Obesity, Class III, BMI 40-49.9 (morbid obesity) (HCC) 12/03/2022   Breast  cancer (HCC) 12/03/2022   HLD (hyperlipidemia) 12/03/2022   OSA on CPAP 12/03/2022   Hypersensitivity reaction 10/24/2022   Invasive ductal carcinoma of breast, left (HCC) 09/26/2022   Endometrial thickening on ultrasound 07/05/2021   CKD stage G3a/A1, 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) 12/10/2019   Dyspareunia due to medical condition in female 10/24/2019   Postmenopausal bleeding 03/08/2018   Pre-diabetes 11/22/2015    Depression 06/03/2014   Class 3 severe obesity without serious comorbidity with body mass index (BMI) of 50.0 to 59.9 in adult (HCC) 01/07/2014   Severe obesity (HCC) 01/07/2014   Hypertension, benign 10/25/2011   Primary localized osteoarthrosis, lower leg 12/19/2007    ONSET DATE: 3-6 months  REFERRING DIAG: Bilateral hand stiffness and pain mostly 3rd through 5th digits  THERAPY DIAG:  Stiffness of right hand, not elsewhere classified  Stiffness of left hand, not elsewhere classified  Other disturbances of skin sensation  Rationale for Evaluation and Treatment: Rehabilitation  SUBJECTIVE:   SUBJECTIVE STATEMENT: Pt is excited to not have to cook for Thanksgiving as that is too much stress on her hands.   Pt accompanied by: self  PERTINENT HISTORY: 01/03/24 Dr Kathlynn visit :  DIAGNOSTIC Upper extremity nerve conduction study: Chronic mild sensory polyneuropathy (11/07/2023) Lower extremity nerve conduction study: Chronic severe sensorimotor polyneuropathy (11/06/2023)    Assessment/Plan:   Assessment & Plan Bilateral hand stiffness and weakness  Chronic bilateral hand stiffness and weakness with difficulty making a fist. Negative for carpal tunnel syndrome and trigger finger. Suspected inflammatory problem affecting tendons, not related to bones or nerves. Possible rheumatologic condition considered, though no joint swelling observed. Diabetes is well-controlled with an A1c of 5.8, and impaired kidney function precludes anti-inflammatory medications. Refer to hand therapy at [facility] outpatient facility on [facility] for therapy twice a week for a month. Instruct to contact [name], the hand therapist, directly for specialized care. Advise to call back if no improvement after a month for re-evaluation. Plan for potential follow-up with a hand specialist, Dr. Ezra, if symptoms persist.  Mild bilateral hand osteoarthritis  Mild osteoarthritis in hand joints, not severe  enough to explain current stiffness and weakness. X-rays show minimal arthritis, particularly in the thumb. Diagnoses and all orders for this visit:  Stiffness of joints of both hands - Ambulatory Referral to Occupational Therapy   PRECAUTIONS: 10 pound limit because of cervical surgery about 3 months ago   WEIGHT BEARING RESTRICTIONS: No  PAIN:  Are you having pain?  1/10 tenderness over A1 pulley, no locking or triggering in session FALLS: Has patient fallen in last 6 months? No  LIVING ENVIRONMENT: Lives with: lives with their family   PLOF: Patient on disability.  Patient had breast cancer treatment and then had about 3 months ago cervical surgery.  Patient perform her own ADLs and helps at home with some cleaning some cooking and laundry.  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  PATIENT GOALS: I want the stiffness and the pain better so I can use my hands doing laundry, cooking, bathing, driving.    OBJECTIVE:  Note: Objective measures were completed at Evaluation unless otherwise noted.  HAND DOMINANCE: Left  ADLs: Unable to grasp to cook or laundry or cleaning cannot make a composite fist with increased pain and stiffness in the palm, fingers and forearm-difficulty with bathing and dressing and driving to  FUNCTIONAL OUTCOME MEASURES: To do next session  UPPER EXTREMITY ROM:     Active ROM Right eval Right 03/03/24 Left eval Left 03/03/24  Shoulder flexion      Shoulder abduction      Shoulder adduction      Shoulder extension      Shoulder internal rotation      Shoulder external rotation      Elbow flexion      Elbow extension      Wrist flexion      Wrist extension 60 60 55 55  Wrist ulnar deviation      Wrist radial deviation      Wrist pronation      Wrist supination      (Blank rows = not tested)  Active ROM Right eval Left eval R/L 01/14/24 R/L 01/24/24 R/L  03/03/24  Thumb MCP (0-60)        Thumb IP (0-80)       Thumb Radial abd/add (0-55)        Thumb Palmar abd/add (0-45)        Thumb Opposition to Small Finger        Index MCP (0-90) 65 75  70/75 60/75 65/75   Index PIP (0-100) 95  90 100/95 100/100 100/95  Index DIP (0-70)         Long MCP (0-90) 70  80  85/90 90/90 95/95   Long PIP (0-100)  90 90  100/90 100/95 100/100  Long DIP (0-70)         Ring MCP (0-90) 75  90  85/90 85/90 88/90   Ring PIP (0-100)  90  90 100/90 100/95 100/105  Ring DIP (0-70)         Little MCP (0-90)  80  90 85/90 90/90 95/95   Little PIP (0-100)  90 85  95/90 100/95 105/105  Little DIP (0-70)         (Blank rows = not tested) Patient with increased pain in the left more than the right with attempts of composite fist.  HAND FUNCTION:  Grip strength: Right: 26 lbs; Left: 24 lbs, Lateral pinch: Right: 12 lbs, Left: 6 lbs, and 3 point pinch: Right: 9 lbs, Left: 5 lbs 01/14/24  Grip strength: Right: 35 lbs; Left: 24 lbs, Lateral pinch: Right: 12 lbs, Left: 12 lbs, and 3 point pinch: Right: 9 lbs, Left: 5 lbs 01/24/24  Grip strength: Right: 38 lbs; Left: 26 lbs, Lateral pinch: Right: 14 lbs, Left: 12 lbs, and 3 point pinch: Right: 10 lbs, Left: 8 lbs 03/03/24  Grip strength: Right: 40 lbs; Left: 40 lbs, Lateral pinch: Right: 14 lbs, Left: 10 lbs, and 3 point pinch: Right: 10 lbs, Left: 8 lbs  COORDINATION: Decreased because of increased stiffness in digits unable to make composite fist.  And pain in digit with 3-point pinch with 2-point pinch  SENSATION: Since chemotherapy patient reports no neuropathy in all digits  EDEMA: Patient denies swelling  COGNITION: Overall cognitive status: Within functional limits for tasks assessed     TREATMENT DATE: 04/11/24       Patient arrived this date with active range of motion in bilateral hands within normal limits. Increased motion in bilateral fourth digits and improved pain 1/10 tenderness; no locking or triggering in session. Patient denies any  numbness in bilateral hands but continued to have neuropathy because of chemotherapy.  Reviewed with patient again the importance of using heat in the morning prior to gentle passive range of motion to DIP PIP of digits and gentle composite flexion  prior to attempts of active fist  Paraffin done 8 minutes B hands prior to review of home exercises for PIP DIP flexion followed by composite fist. No triggering no locking in session  A1 pulley stretch L and R 4th digit x3 each Graston tool #6 brushing along A1 pulley. Pt reported good carryover of joint protection strategies for carrying groceries and phone use. Reviewed strategies for cooking as pt reports not cooking to avoid pain during paraffin   Joint Protection Respect pain/Develop pain awareness Monitor activities - stop to rest when discomfort or fatigue develops Pain lingering 2 hrs after activity is a warning sign - modify or eliminate Use larger joints and muscles Protect the delicate joints by using larger joints - use shoulder to carry bags not hands More proximal muscles and joints to perform ADL's (forearm/shoulder to open door-protect fingers) Let the hip, elbow and arm support the grocery bag/push cart's weight Push up from seat with palms of hands, not back of fingers Avoid tight/strong and prolonged grasp Build up handles (foam or cloth) on utensils, tools and pens Use AE for assistance for opening jars, pens and books, turning knobs and keys Use open, flat palm to open jars, pump bottles for shampoo/soaps Avoid carrying, lifting and using heavy objects Push heavy objects with hips instead of pulling with hands Distribute weight over many joints - use 2 hands to lift an item Balance rest and activity Plan rest breaks ahead of time Avoid activities that cannot be stopped if needed Energy conservation techniques (sitting when able, planning ahead, organizing workspace and storage for accessibility, keeping most common used  items within easy access, resting during activities, use timesavers like prepared foods)   PATIENT EDUCATION: Education details: findings of eval and HEP  Person educated: Patient Education method: Explanation, Demonstration, Tactile cues, Verbal cues, and Handouts Education comprehension: verbalized understanding, returned demonstration, verbal cues required, and needs further education    GOALS: Goals reviewed with patient? Yes    LONG TERM GOALS: Target date: 6 weeks  Patient to be independent in home program to decrease pain with home exercises to less than 5/10 to be able to increase motion Baseline: Pain 9/10 with decreased MCP flexion on the right 65-80 and PIP is 90-95.  Left MC 75-90 and PIP is 90 to 85 degrees. 03/03/24: pain 5/10 with MCP flexion R 65-95 and PIP 100-105, L MCP 75-95 and PIP 95-105 Goal status: Met  2.  Bilateral digits flexion improved for patient to be able to touch palm with pain less than 2/10 with cutting food, brushing her teeth and driving. Baseline: Patient limited in flexion of all digits with increased pain over the metacarpals as well as the volar palm and forearm.  With increased pain to 9/10 with use. 03/03/24: achieves full fist touching palm with 5/10 pain.  Goal status: Progressing  3.  Patient's pain in bilateral metacarpals as well as palm of forearm decreased to 2/10 to improve active range of motion of bilateral hands and wrist within functional limits Baseline: Pain 9/10 with decreased motion and decreased strength. 03/03/24: 5/10 pain.  Goal status: Met  4.  Bilateral grip and prehension strength improved to within normal range for her age to be able to improve her functional use in cooking and laundry symptom free Baseline: Right grip 26 pounds and left 24 pounds with pain.  Lateral pinch 12 pounds on the right and left 6 pounds and 3 point pinch 9 on the right left 5 pounds.  With increased pain with functional use. 03/03/24: R grip L  and R 40lbs. Lateral pinch R 14lbs, L 10 lbs.  Goal status: Progressing   ASSESSMENT:  CLINICAL IMPRESSION: Patient seen  for occupational therapy for bilateral hand and finger stiffness.  With increased pain.  Left worse than the right.  Patient is left-handed.  Patient had breast cancer with treatment of chemotherapy and report some neuropathy in all digits.  Patient also had cervical surgery last few months patient with some arthritic changes to bilateral hands.  Patient present to OT evaluation with decreased AROM in all digits with 3rd through 5th the worst.  But patient also limited in second digit avoiding composite fist but using more lateral and 3-point pinch.  Patient's left hand pain worse than the right but motion wise right worse than the left.  See flowsheets above for motion.  Patient with increased pain over the metacarpals into the palm as well as the volar forearm.  Thumb range of motion within normal limits and denies any pain.    NOW patient arrived today with great improvement in pain in all digits.  No triggering no locking of bilateral fourth digits. Improved tenderness over the fourth A1 pulleys 1/10 L hand, 0/10 R hand. Reviewed joint protection strategies and HEP including not overdoing AROM exercises. Good recall of passive range of motion for bilateral fourth digit DIP /PIP flexion prior to attempts of composite flexion. Added A1 pulley stretch. Reinforced with patient again the importance of joint protection strategies with cooking as well as carrying groceries and performing laundry. Plan to let patient complete HEP and revisit in Jan as needed. Patient limited in functional use of bilateral hands and ADLs and IADLs.  Patient can benefit from skilled OT services to decrease pain and stiffness and increased motion and strength to return to prior level of function.  PERFORMANCE DEFICITS: in functional skills including ADLs, IADLs, ROM, strength, pain, flexibility, decreased  knowledge of use of DME, and UE functional use,   and psychosocial skills including environmental adaptation and routines and behaviors.   IMPAIRMENTS: are limiting patient from ADLs, IADLs, rest and sleep, play, leisure, and social participation.   COMORBIDITIES: has no other co-morbidities that affects occupational performance. Patient will benefit from skilled OT to address above impairments and improve overall function.  MODIFICATION OR ASSISTANCE TO COMPLETE EVALUATION: No modification of tasks or assist necessary to complete an evaluation.  OT OCCUPATIONAL PROFILE AND HISTORY: Problem focused assessment: Including review of records relating to presenting problem.  CLINICAL DECISION MAKING: LOW - limited treatment options, no task modification necessary  REHAB POTENTIAL: Good for goals  EVALUATION COMPLEXITY: Low      PLAN:  OT FREQUENCY: 2-3 visits  OT DURATION: 6 wks  PLANNED INTERVENTIONS: 97168 OT Re-evaluation, 97535 self care/ADL training, 02889 therapeutic exercise, 97530 therapeutic activity, 97112 neuromuscular re-education, 97140 manual therapy, 97035 ultrasound, 97018 paraffin, 02960 fluidotherapy, 97010 moist heat, 97760 Orthotic Initial, [id number] Orthotic/Prosthetic subsequent, patient/family education, and DME and/or AE instructions    CONSULTED AND AGREED WITH PLAN OF CARE: Patient     Elston JINNY Slot, OTR/L  04/11/2024, 9:05 AM

## 2024-04-14 ENCOUNTER — Ambulatory Visit
Admission: RE | Admit: 2024-04-14 | Discharge: 2024-04-14 | Disposition: A | Discharge status: Home / Self Care | Source: Ambulatory Visit | Attending: Family Medicine | Admitting: Family Medicine

## 2024-04-14 DIAGNOSIS — N823 Fistula of vagina to large intestine: Secondary | ICD-10-CM | POA: Insufficient documentation

## 2024-04-14 MED ORDER — ATORVASTATIN CALCIUM 20 MG PO TABS: 20.0000 mg | ORAL_TABLET | Freq: Every day | ORAL | 1 refills | Status: AC

## 2024-04-14 MED ORDER — GADOBUTROL 1 MMOL/ML IV SOLN
10.0000 mL | Freq: Once | INTRAVENOUS | Status: AC | PRN
  Administered 2024-04-14: 10 mL via INTRAVENOUS

## 2024-04-15 ENCOUNTER — Ambulatory Visit: Attending: Pain Medicine | Admitting: Pain Medicine

## 2024-04-15 VITALS — BP 142/74 | HR 65 | Temp 97.2°F | Resp 16 | Ht 63.0 in | Wt 278.0 lb

## 2024-04-15 DIAGNOSIS — M79605 Pain in left leg: Secondary | ICD-10-CM | POA: Diagnosis present

## 2024-04-15 DIAGNOSIS — G8929 Other chronic pain: Secondary | ICD-10-CM | POA: Diagnosis present

## 2024-04-15 DIAGNOSIS — M79672 Pain in left foot: Secondary | ICD-10-CM | POA: Insufficient documentation

## 2024-04-15 DIAGNOSIS — E1142 Type 2 diabetes mellitus with diabetic polyneuropathy: Secondary | ICD-10-CM | POA: Diagnosis present

## 2024-04-15 DIAGNOSIS — R94130 Abnormal response to nerve stimulation, unspecified: Secondary | ICD-10-CM | POA: Insufficient documentation

## 2024-04-15 DIAGNOSIS — M79604 Pain in right leg: Secondary | ICD-10-CM | POA: Insufficient documentation

## 2024-04-15 DIAGNOSIS — M79671 Pain in right foot: Secondary | ICD-10-CM | POA: Diagnosis present

## 2024-04-15 DIAGNOSIS — G608 Other hereditary and idiopathic neuropathies: Secondary | ICD-10-CM | POA: Insufficient documentation

## 2024-04-15 DIAGNOSIS — G620 Drug-induced polyneuropathy: Secondary | ICD-10-CM | POA: Diagnosis present

## 2024-04-15 DIAGNOSIS — G629 Polyneuropathy, unspecified: Secondary | ICD-10-CM | POA: Diagnosis present

## 2024-04-15 DIAGNOSIS — T451X5A Adverse effect of antineoplastic and immunosuppressive drugs, initial encounter: Secondary | ICD-10-CM | POA: Insufficient documentation

## 2024-04-15 MED ORDER — CAPSAICIN-CLEANSING GEL 8 % EX KIT
4.0000 | PACK | Freq: Once | CUTANEOUS | Status: AC
  Administered 2024-04-15: 4 via TOPICAL

## 2024-04-15 NOTE — Patient Instructions (Signed)
 ______________________________________________________________________    Patient information sheet: Qutenza   Capsaicin  Topical System - intended to be used only by healthcare providers.  Concentration of capsaicin  and the patches is 8% compared to maximum over-the-counter cream concentrations of 0.1%.  What is this medication?   CAPSAICIN  (cap SAY sin) treats nerve pain. It works by making your skin feel warm or cool, which blocks pain signals going to the brain. This medicine may be used for other purposes; ask your health care provider or pharmacist if you have questions.  COMMON BRAND NAME(S): Qutenza   What should I tell my care team before I take this medication? They need to know if you have any of these conditions: Have had a heart attack or stroke High blood pressure Large areas of burned or damaged skin An unusual or allergic reaction to capsaicin , hot peppers, other medications, foods, dyes, or preservatives Pregnant or trying to get pregnant Breastfeeding  How should I use this medication? This medication is for external use only. It is applied by your care team in a hospital or clinic setting. Talk to your care team about the use of this medication in children. Special care may be needed.  Overdosage: If you think you have taken too much of this medicine contact a poison control center or emergency room at once.  NOTE: This medicine is only for you. Do not share this medicine with others.  What if I miss a dose? This does not apply.  What may interact with this medication? Interactions are not expected. Do not use any other skin products on the affected area without asking your care team. This list may not describe all possible interactions. Give your health care provider a list of all the medicines, herbs, non-prescription drugs, or dietary supplements you use. Also tell them if you smoke, drink alcohol, or use illegal drugs. Some items may interact with your  medicine.  What should I watch for while using this medication? Your condition will be monitored carefully while you are receiving this medication. Tell your care team if your symptoms do not start to get better or if they get worse.  Talk to your care team about how to treat discomfort. You may place a cooling pack from the refrigerator (not the freezer) on the area. Do not place it directly on the skin.  Try not to touch the area where the patch was applied. If you do, wash your hands with soap and water right away. Your skin may be sensitive to heat for a few days after treatment. Avoid hot baths or showers, heating pads, and direct sunlight on the treated area.  What side effects may I notice from receiving this medication? Side effects that you should report to your care team as soon as possible: Allergic reactions--skin rash, itching, hives, swelling of the face, lips, tongue, or throat Burning, itching, crusting, or peeling of treated skin Increase in blood pressure Numbness, decrease in sense of touch or sensation  Side effects that usually do not require medical attention (report these to your care team if they continue or are bothersome): Mild skin irritation, redness, or dryness  This list may not describe all possible side effects. Call your doctor for medical advice about side effects. You may report side effects to FDA at 1-800-FDA-1088.  Where should I keep my medication? This medication is given in a hospital or clinic. It will not be stored at home.  NOTE: This sheet is a summary. It may not cover all possible  information. If you have questions about this medicine, talk to your doctor, pharmacist, or health care provider.   2024 Elsevier/Gold Standard (2023-04-20 00:00:00)  ______________________________________________________________________

## 2024-04-15 NOTE — Progress Notes (Unsigned)
 Safety precautions to be maintained throughout the outpatient stay will include: orient to surroundings, keep bed in low position, maintain call bell within reach at all times, provide assistance with transfer out of bed and ambulation.

## 2024-04-15 NOTE — Progress Notes (Unsigned)
 PROVIDER NOTE: Interpretation of information contained herein should be left to medically-trained personnel. Specific patient instructions are provided elsewhere under Patient Instructions section of medical record. This document was created in part using STT-dictation technology, any transcriptional errors that may result from this process are unintentional.  Patient: Leslie Bautista Type: Established DOB: [birth year] MRN: [id number] PCP: [name] Curtis LABOR, FNP (Inactive)  Service: Procedure DOS: 04/15/2024 Setting: Ambulatory Location: Ambulatory outpatient facility Delivery: Face-to-face Provider: Eric LABOR Como, MD Specialty: Interventional Pain Management Specialty designation: 09 Location: Outpatient facility Ref. Prov.: [name] Curtis LABOR, FNP       Interventional Therapy   Type: Qutenza  Neurolysis #1  Laterality:  Bilateral Area treated: Feet Imaging Guidance: None Anesthesia/analgesia/anxiolysis/sedation: None required Medication (Right): Qutenza  (capsaicin  8%) topical system Medication (Left): Qutenza  (capsaicin  8%) topical system Date: 04/15/2024 Performed by: Eric LABOR Como, MD Rationale (medical necessity): procedure needed and proper for the treatment of Leslie Bautista's medical symptoms and needs. Indication: Painful diabetic peripheral neuralgia (DPN) (ICD-10-CM:E11.40) severe enough to impact quality of life or function. 1. Chronic lower extremity pain (1ry area of Pain) (Bilateral)   2. Chronic feet pain (Bilateral)   3. Diabetic sensorimotor polyneuropathy (HCC)   4. Polyneuropathy, peripheral sensorimotor axonal   5. Sensory peripheral neuropathy   6. Multifactorial peripheral neuropathy   7. Chemotherapy-induced neuropathy   8. Abnormal NCS (nerve conduction studies) (11/07/2023)    NAS-11 Pain score:   Pre-procedure: 7 /10   Post-procedure: 7 /10     Position / Prep / Materials:  Position: Supine  Materials: Qutenza  Kit (4 patches)  H&P (Pre-op  Assessment):  Leslie Bautista is a 63 y.o. (year old), female patient, seen today for interventional treatment. She  has a past surgical history that includes Total knee arthroplasty (Right); Tubal ligation; Cataract extraction, bilateral (Bilateral, 2022); Colonoscopy (2015); Ankle surgery (Right); Tonsillectomy (Bilateral, 06/14/2021); Breast surgery; Eye surgery; Joint replacement (Right); Colonoscopy with propofol  (N/A, 07/26/2023); and polypectomy (07/26/2023). Leslie Bautista has a current medication list which includes the following prescription(s): anastrozole, atorvastatin , duloxetine, gabapentin , gabapentin , losartan -hydrochlorothiazide, meloxicam , nortriptyline, omeprazole, pantoprazole , rybelsus , and trazodone . Her primarily concern today is the Foot Pain (bilateral)  Initial Vital Signs:  Pulse/HCG Rate: 65  Temp: (!) 97.2 F (36.2 C) Resp: 16 BP: (!) 142/74 SpO2: 99 %  BMI: Estimated body mass index is 49.25 kg/m as calculated from the following:   Height as of this encounter: 5' 3 (1.6 m).   Weight as of this encounter: 278 lb (126.1 kg).  Risk Assessment: Allergies: Reviewed. She is allergic to metformin  and related and oxycontin  [oxycodone  hcl].  Allergy Precautions: None required Coagulopathies: Reviewed. None identified.  Blood-thinner therapy: None at this time Active Infection(s): Reviewed. None identified. Leslie Bautista is afebrile  Site Confirmation: Leslie Bautista was asked to confirm the procedure and laterality before marking the site Procedure checklist: Completed Consent: Before the procedure and under the influence of no sedative(s), amnesic(s), or anxiolytics, the patient was informed of the treatment options, risks and possible complications. To fulfill our ethical and legal obligations, as recommended by the American Medical Association's Code of Ethics, I have informed the patient of my clinical impression; the nature and purpose of the treatment or procedure; the risks,  benefits, and possible complications of the intervention; the alternatives, including doing nothing; the risk(s) and benefit(s) of the alternative treatment(s) or procedure(s); and the risk(s) and benefit(s) of doing nothing. The patient was provided information about the general risks and possible complications associated with the procedure. These may include,  but are not limited to: failure to achieve desired goals, infection, bleeding, organ or nerve damage, allergic reactions, paralysis, and death. In addition, the patient was informed of those risks and complications associated to the procedure, such as failure to decrease pain; infection; bleeding; organ or nerve damage with subsequent damage to sensory, motor, and/or autonomic systems, resulting in permanent pain, numbness, and/or weakness of one or several areas of the body; allergic reactions; (i.e.: anaphylactic reaction); and/or death. Furthermore, the patient was informed of those risks and complications associated with the medications. These include, but are not limited to: allergic reactions (i.e.: anaphylactic or anaphylactoid reaction(s)); adrenal axis suppression; blood sugar elevation that in diabetics may result in ketoacidosis or comma; water retention that in patients with history of congestive heart failure may result in shortness of breath, pulmonary edema, and decompensation with resultant heart failure; weight gain; swelling or edema; medication-induced neural toxicity; particulate matter embolism and blood vessel occlusion with resultant organ, and/or nervous system infarction; and/or aseptic necrosis of one or more joints. Finally, the patient was informed that Medicine is not an exact science; therefore, there is also the possibility of unforeseen or unpredictable risks and/or possible complications that may result in a catastrophic outcome. The patient indicated having understood very clearly. We have given the patient no guarantees  and we have made no promises. Enough time was given to the patient to ask questions, all of which were answered to the patient's satisfaction. Leslie Bautista has indicated that she wanted to continue with the procedure. Attestation: I, the ordering provider, attest that I have discussed with the patient the benefits, risks, side-effects, alternatives, likelihood of achieving goals, and potential problems during recovery for the procedure that I have provided informed consent. Date  Time: 04/15/2024 11:49 AM  Pre-Procedure Preparation:  Monitoring: As per clinic protocol. Respiration, ETCO2, SpO2, BP, heart rate and rhythm monitor placed and checked for adequate function Safety Precautions: Patient was assessed for positional comfort and pressure points before starting the procedure. Time-out: I initiated and conducted the Time-out before starting the procedure, as per protocol. The patient was asked to participate by confirming the accuracy of the Time Out information. Verification of the correct person, site, and procedure were performed and confirmed by me, the nursing staff, and the patient. Time-out conducted as per Joint Commission's Universal Protocol (UP.01.01.01). Time: 1207 Start Time: 1210 hrs.  Description/Narrative of Procedure:          Region: Distal lower extremities Target Area: Sensory peripheral nerves affected by diabetic peripheral neuropathy Site: Feet Approach: Percutaneous  No./Series: Not applicable  Type: Percutaneous  Purpose: Therapeutic  Region: Distal lower extremities  Start Time: 1210 hrs.  Description of the Procedure: Protocol guidelines were followed. The patient was assisted into a comfortable position.  Informed consent was obtained in the patient monitored in the usual manner.  All questions were answered prior to the procedure.  They Qutenza  patches were applied to the affected area and then covered with the wrap.  The Patient was kept under  observation until the treatment was completed.  The patches were removed and the treated area was inspected.  Vitals:   04/15/24 1147  BP: (!) 142/74  Pulse: 65  Resp: 16  Temp: (!) 97.2 F (36.2 C)  SpO2: 99%  Weight: 278 lb (126.1 kg)  Height: 5' 3 (1.6 m)     End Time: 1243 hrs.  Type of Imaging Technique: None used Indication(s): N/A Exposure Time: No patient exposure Contrast: None used.  Fluoroscopic Guidance: N/A Ultrasound Guidance: N/A Interpretation: N/A  Post-operative Assessment:  Post-procedure Vital Signs:  Pulse/HCG Rate: 65  Temp: (!) 97.2 F (36.2 C) Resp: 16 BP: (!) 142/74 SpO2: 99 %  EBL: None  Complications: No immediate post-treatment complications observed by team, or reported by patient.  Note: The patient tolerated the entire procedure well. A repeat set of vitals were taken after the procedure and the patient was kept under observation following institutional policy, for this type of procedure. Post-procedural neurological assessment was performed, showing return to baseline, prior to discharge. The patient was provided with post-procedure discharge instructions, including a section on how to identify potential problems. Should any problems arise concerning this procedure, the patient was given instructions to immediately contact us , at any time, without hesitation. In any case, we plan to contact the patient by telephone for a follow-up status report regarding this interventional procedure.  Comments:  No additional relevant information.  Plan of Care (POC)  Orders:  Orders Placed This Encounter  Procedures   NEUROLYSIS    Where will this procedure be performed?:   [facility]   Informed Consent Details: Physician/Practitioner Attestation; Transcribe to consent form and obtain patient signature    Nursing Order: Transcribe to consent form and obtain patient signature.    Physician/Practitioner attestation of informed consent for  procedure/surgical case:   I, the physician/practitioner, attest that I have discussed with the patient the benefits, risks, side effects, alternatives, likelihood of achieving goals and potential problems during recovery for the procedure that I have provided informed consent.    Procedure:   Qutenza  peripheral neurolysis    Physician/Practitioner performing the procedure:   Eric Como, MD    Indication/Reason:   Painful Diabetic Peripheral Neuropathy     Opioid Analgesic: None MME/day: 0 mg/day    Medications ordered for procedure: Meds ordered this encounter  Medications   capsaicin  topical system 8 % patch 4 patch    Disregard order if transmitted to pharmacy. Qutenza  Kit to be used from [facility].   Medications administered: We administered capsaicin  topical system.  See the medical record for exact dosing, route, and time of administration.    Interventional Therapies  Risk Factors  Considerations  Medical Comorbidities:  MO (BMI>45)  Hx. Breast CA  Stage 3a CKD  T2NIDDM  HTN  OSA w/ CPAP  Thrombocytopenia     Planned  Pending:   Therapeutic bilateral lower extremity Qutenza  treatment #1    Under consideration:   Therapeutic bilateral lower extremity Qutenza  treatment #1  Possible Therapeutic bilateral upper extremity Qutenza  treatment #1  Diagnostic right sided lumbar facet MBB #1 (L3-S1)    Completed: (Analgesic benefit)1  None at this time   Therapeutic  Palliative (PRN) options:   None established   Completed by other providers:   None reported  1(Analgesic benefit): Expressed in percentage (%). (Local anesthetic[LA] +/- sedation  L.A.Local Anesthetic  Steroid benefit  Ongoing benefit)      Follow-up plan:   Return in about 2 weeks (around 04/29/2024) for (Face2F), (PPE).     Recent Visits Date Type Provider Dept  04/15/24 Procedure visit Como Eric, MD [facility]  04/09/24 Office Visit Como Eric, MD [facility]  03/10/24 Office Visit Como Eric, MD [facility]  Showing recent visits within past 90 days and meeting all other requirements Future Appointments Date Type Provider Dept  05/07/24 Appointment Como Eric, MD [facility]  Showing future  appointments within next 90 days and meeting all other requirements   Disposition: Discharge home  Discharge (Date  Time): 04/15/2024; 1250 hrs.   Primary Care Physician: [name] Curtis LABOR, FNP (Inactive) Location: [facility] Note by: Eric LABOR Como, MD (TTS technology used. I apologize for any typographical errors that were not detected and corrected.) Date: 04/15/2024; Time: 7:48 AM  Disclaimer:  Medicine is not an visual merchandiser. The only guarantee in medicine is that nothing is guaranteed. It is important to note that the decision to proceed with this intervention was based on the information collected from the patient. The Data and conclusions were drawn from the patient's questionnaire, the interview, and the physical examination. Because the information was provided in large part by the patient, it cannot be guaranteed that it has not been purposely or unconsciously manipulated. Every effort has been made to obtain as much relevant data as possible for this evaluation. It is important to note that the conclusions that lead to this procedure are derived in large part from the available data. Always take into account that the treatment will also be dependent on availability of resources and existing treatment guidelines, considered by other Pain Management Practitioners as being common knowledge and practice, at the time of the intervention. For Medico-Legal purposes, it is also important to point out that variation in procedural techniques and pharmacological choices are the acceptable norm. The indications, contraindications, technique, and results of  the above procedure should only be interpreted and judged by a Board-Certified Interventional Pain Specialist with extensive familiarity and expertise in the same exact procedure and technique.

## 2024-04-16 ENCOUNTER — Telehealth: Payer: Self-pay

## 2024-04-16 ENCOUNTER — Ambulatory Visit: Payer: Self-pay | Admitting: Family Medicine

## 2024-04-16 ENCOUNTER — Telehealth: Payer: Self-pay | Admitting: *Deleted

## 2024-04-16 NOTE — Telephone Encounter (Signed)
 Patient return call.  AGI  gave patient and number that she needs to call to make follow up appt.

## 2024-04-16 NOTE — Telephone Encounter (Signed)
 Post procedure call; voicemail left

## 2024-04-16 NOTE — Telephone Encounter (Signed)
 Patient notified of MRI results- Patient is currently in [place] and unable to write the phone number for the below- patient instructed to call and follow up with scheduling an appointment with [facility] in [place]. Please provide their office number when patient calls back.  Referring To Provider Information [facility] [address] [place] [address] [phone]  MRI Pelvis showed no fistulous tract between the rectum/distal sigmoid colon and reproductive organs.    Dorothe, please call patient and let her know that her MRI of the pelvis showed no signs of a fistula.  She also had referral for urogynecology.  Remind her to follow-up and make sure she has an appointment with them also.   Thanks   Grayce Bohr, DNP, AGNP-C

## 2024-04-23 ENCOUNTER — Ambulatory Visit: Admitting: Podiatry

## 2024-04-23 DIAGNOSIS — L600 Ingrowing nail: Secondary | ICD-10-CM

## 2024-04-23 MED ORDER — NEOMYCIN-POLYMYXIN-HC 3.5-10000-1 OT SOLN: OTIC | 0 refills | Status: AC

## 2024-04-23 NOTE — Progress Notes (Signed)
 She presents today chief complaint of painful big toenail on her right foot.  States that it feels like it is ingrown.  Objective: Vital signs are stable she is alert and oriented x 3.  She has sharp incurvated nail margin along the tibial border of the hallux right.  No purulence no malodor.  Assessment: Ingrown toenail tibial border hallux right.  Plan: Discussed etiology pathology conservative surgical therapies at this point performed a chemical matricectomy after local anesthetic was administered.  Tolerated procedure well after this was administered.  The nail was split from distal proximal avulsed in total removed all reactive hyperkeratotic tissue and all necrotic tissue.  2 applications of phenol were applied 30 seconds each neutralized with isopropyl alcohol Silvadene cream Telfa pad addressed a compressive dressing was applied.  She was given both oral and written instruction for the care and soaking of the foot as well as a prescription for Cortisporin Otic to be applied twice daily after soaking.  I will follow-up with her in 2 to 3 weeks.

## 2024-04-23 NOTE — Patient Instructions (Signed)

## 2024-04-24 ENCOUNTER — Telehealth: Payer: Self-pay | Admitting: Podiatry

## 2024-04-24 ENCOUNTER — Telehealth: Payer: Self-pay | Admitting: Lab

## 2024-04-24 MED ORDER — HYDROCODONE-ACETAMINOPHEN 10-325 MG PO TABS: 1.0000 | ORAL_TABLET | Freq: Three times a day (TID) | ORAL | 0 refills | Status: AC | PRN

## 2024-04-24 NOTE — Telephone Encounter (Signed)
 Called patient let her know Dr Verta sent over RX for some pain meds.

## 2024-04-24 NOTE — Telephone Encounter (Signed)
 Called and spoke with patient - she has removed the original bandage and soaked her toe in epsom salt and warm water. She is still in excruciating pain - very sharpe and shooting from her toe, through her foot and into her lower leg. She went to the [place] office and was told Dr. Verta was not there today - not sure if Dr. Tobie could have seen her or not - she has tried elevating, but it's still very painful - should she be seen in after hours clinic? Should we change her soaks to betadine or antibacterial soap?

## 2024-04-24 NOTE — Telephone Encounter (Signed)
 Patient calling requesting pain medication unable to sleep due to pain after procedure in office please review and advise.

## 2024-04-24 NOTE — Telephone Encounter (Signed)
 Patient calling again stating is in severe pain advise.

## 2024-04-30 ENCOUNTER — Telehealth: Payer: Self-pay | Admitting: Family Medicine

## 2024-04-30 NOTE — Telephone Encounter (Signed)
 Pt requesting refill to soon. Pt should have enough til January 2026.

## 2024-04-30 NOTE — Telephone Encounter (Signed)
 Refill Request   Ph[facility]: [facility]  Medication: traZODone  (DESYREL ) 50 MG tablet [8085]   Quantity (if provided): 90  Please send in refill request.

## 2024-05-06 ENCOUNTER — Ambulatory Visit: Payer: Self-pay

## 2024-05-06 NOTE — Telephone Encounter (Signed)
 Reason for Disposition  [1] Continuous (nonstop) coughing interferes with work or school AND [2] no improvement using cough treatment per Care Advice  Answer Assessment - Initial Assessment Questions 1. ONSET: When did the cough begin?      X 4 days 3. SPUTUM: Describe the color of your sputum (e.g., none, dry cough; clear, white, yellow, green)     Yellow 4. HEMOPTYSIS: Are you coughing up any blood? If Yes, ask: How much? (e.g., flecks, streaks, tablespoons, etc.)     None 5. DIFFICULTY BREATHING: Are you having difficulty breathing? If Yes, ask: How bad is it? (e.g., mild, moderate, severe)      None 6. FEVER: Do you have a fever? If Yes, ask: What is your temperature, how was it measured, and when did it start?     None 7. CARDIAC HISTORY: Do you have any history of heart disease? (e.g., heart attack, congestive heart failure)      None 8. LUNG HISTORY: Do you have any history of lung disease?  (e.g., pulmonary embolus, asthma, emphysema)     None 9. PE RISK FACTORS: Do you have a history of blood clots? (or: recent major surgery, recent prolonged travel, bedridden)     None 10. OTHER SYMPTOMS: Do you have any other symptoms? (e.g., runny nose, wheezing, chest pain)       HA, nasal congestion 12. TRAVEL: Have you traveled out of the country in the last month? (e.g., travel history, exposures)       None  Protocols used: Cough - Acute Productive-A-AH

## 2024-05-06 NOTE — Telephone Encounter (Addendum)
 FYI Only or Action Required?: FYI only for provider: OV offered, pt declines and reports she will call back for OV if home remedies do not resolve symptoms. This RN educated pt on home care, new-worsening symptoms, when to call back/seek emergent care. Pt verbalized understanding and agrees to plan.   Patient was last seen in primary care on 03/26/2024 by [name] Curtis LABOR, FNP.  Called Nurse Triage reporting Cough.  Symptoms began several days ago.  Interventions attempted: OTC medications: Theraflu.  Symptoms are: stable.  Triage Disposition: See Physician Within 24 Hours  Patient/caregiver understands and will follow disposition?: No, refuses disposition   1st attempt, unable to LVM   Copied from CRM #[id number]. Topic: Clinical - Red Word Triage >> May 05, 2024  4:20 PM Wess RAMAN wrote: Red Word that prompted transfer to Nurse Triage: Patient states she feels terrible. Chills, cough, head stopped up since Saturday, body aches, headaches.  Would like medication called in  [facility] [address], [place] - [address] Bautista [place] [address] Phone: [phone] Fax: [phone] Hours: Not open 24 hours

## 2024-05-07 ENCOUNTER — Ambulatory Visit: Admitting: Pain Medicine

## 2024-05-07 ENCOUNTER — Ambulatory Visit: Admitting: Podiatry

## 2024-05-07 ENCOUNTER — Ambulatory Visit: Payer: Self-pay

## 2024-05-07 DIAGNOSIS — L600 Ingrowing nail: Secondary | ICD-10-CM

## 2024-05-07 DIAGNOSIS — Z09 Encounter for follow-up examination after completed treatment for conditions other than malignant neoplasm: Secondary | ICD-10-CM

## 2024-05-07 DIAGNOSIS — G8929 Other chronic pain: Secondary | ICD-10-CM

## 2024-05-07 MED ORDER — CLINDAMYCIN HCL 150 MG PO CAPS: 150.0000 mg | ORAL_CAPSULE | Freq: Three times a day (TID) | ORAL | 0 refills | Status: DC

## 2024-05-07 NOTE — Patient Instructions (Signed)

## 2024-05-07 NOTE — Progress Notes (Signed)
 Department: [facility]) Date: 05/07/2024  Event: NO SHOW.  Encounter Type: (PPE-1) First/Initial post-procedure evaluation. Visit to review the results of the diagnostic portion of the interventional therapy. Advance notice: None.  Reason: Unknown.          Significance: Unintended loss of valuable diagnostic information. Accuracy of recall has been shown to be inversely proportional to amount of time since procedure. Note: See patient information (AVS) for Appointment policy.

## 2024-05-07 NOTE — Telephone Encounter (Signed)
 Patient spoke with Nurse Triage yesterday and declined Office visit at that time Patient called back today stating she was feeling worse---no appointments available at PCP office or the surrounding offices Patient going to Urgent Care at this time    FYI Only or Action Required?: FYI only for provider: patient going to Urgent Care.  Patient was last seen in primary care on 03/26/2024 by [name] Curtis LABOR, FNP.  Called Nurse Triage reporting Cough.  Symptoms began 4-5 days ago.  Interventions attempted: Rest, hydration, or home remedies.  Symptoms are: gradually worsening.  Triage Disposition: See HCP Within 4 Hours (Or PCP Triage)  Patient/caregiver understands and will follow disposition?: Yes                Copied from CRM #[id number]. Topic: Clinical - Red Word Triage >> May 07, 2024  9:44 AM Cherylann RAMAN wrote: Red Word that prompted transfer to Nurse Triage: Patient called in with complaints of worsening symptoms. Symptoms are chills, shortness of breath, wheezing, coughing, body aches, headaches, back pain, and head congestion. Reason for Disposition  [1] MILD difficulty breathing (e.g., minimal/no SOB at rest, SOB with walking, pulse < 100) AND [2] still present when not coughing  Answer Assessment - Initial Assessment Questions Patient spoke with Nurse Triage yesterday and declined an office appointment at that time Patient states her symptoms have gotten worse She reported cough x 4-5 days with yellow mucous, generalized malaise,    No appointments available at patient's PCP or at any PCP offices within the surrounding region Patient is advised that the recommendation at this time is to go to Urgent Care---patient is currently in her car driving from one appointment and states she is going to go to the Urgent Care on [facility] Patient is advised to call us  back if anything changes or with any further questions/concerns. Patient is advised that if anything  worsens to go to the Emergency Room or call 911. Patient verbalized understanding.  Answer Assessment - Initial Assessment Questions Patient spoke with Nurse Triage yesterday and declined an office appointment at that time Patient states her symptoms have gotten worse She reported cough x 4-5 days with yellow mucous, generalized malaise, denies known fever but states she feels hot Reports chest pain but only when coughing and not present during triage Patient denies shortness of breath at this time but sounds out of breath after walking back to her car from an appointment for another concern   No appointments available at patient's PCP or at any PCP offices within the surrounding region Patient is advised that the recommendation at this time is to go to Urgent Care---patient is currently in her car driving from one appointment and states she is going to go to the Urgent Care on [facility] Patient is advised to call us  back if anything changes or with any further questions/concerns. Patient is advised that if anything worsens to go to the Emergency Room or call 911. Patient verbalized understanding.  Protocols used: Information Only Call - No Triage-A-AH, Cough - Acute Productive-A-AH

## 2024-05-07 NOTE — Progress Notes (Signed)
 She presents for a follow-up of her matricectomy hallux right for the past 2 weeks.  States that is still very painful.  She denies fever chills nausea vomiting muscle aches pains calf pain back pain chest pain shortness of breath.  She does however have a cough and she is currently on the phone trying to get an appointment with her doctor for the cough.  Objective: Vital signs are stable alert oriented x 3.  Pulses are palpable.  Hallux right demonstrates no erythema edema cellulitis drainage or odor there is some hyperpigmentation along the proximal nail fold.  There is scabbing along the medial incision.  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  Plan: Started her on clindamycin  and increase her salt water soaks.  Follow-up with her in 2 weeks

## 2024-05-20 ENCOUNTER — Encounter: Payer: Self-pay | Admitting: Family Medicine

## 2024-05-20 ENCOUNTER — Ambulatory Visit: Admitting: Family Medicine

## 2024-05-20 VITALS — BP 139/56 | HR 67 | Temp 98.4°F | Ht 63.0 in | Wt 286.0 lb

## 2024-05-20 DIAGNOSIS — R051 Acute cough: Secondary | ICD-10-CM

## 2024-05-20 DIAGNOSIS — J40 Bronchitis, not specified as acute or chronic: Secondary | ICD-10-CM

## 2024-05-20 MED ORDER — METHYLPREDNISOLONE ACETATE 40 MG/ML IJ SUSP
40.0000 mg | Freq: Once | INTRAMUSCULAR | Status: AC
  Administered 2024-05-20: 40 mg via INTRAMUSCULAR

## 2024-05-20 MED ORDER — PREDNISONE 20 MG PO TABS: ORAL_TABLET | ORAL | 0 refills | Status: AC

## 2024-05-20 MED ORDER — HYDROCOD POLI-CHLORPHE POLI ER 10-8 MG/5ML PO SUER: 5.0000 mL | Freq: Two times a day (BID) | ORAL | 0 refills | Status: AC | PRN

## 2024-05-20 NOTE — Patient Instructions (Signed)
 To keep you healthy, please keep in mind the following health maintenance items that you are due for:   Health Maintenance Due  Topic Date Due   Bone Density Scan  Never done   Zoster Vaccines- Shingrix (1 of 2) Never done   DTaP/Tdap/Td (9 - Td or Tdap) 02/05/2024   Mammogram  05/21/2024     Best Wishes,   Dr. Lang

## 2024-05-20 NOTE — Progress Notes (Unsigned)
 "  Established Patient Office Visit  Patient ID: Leslie Bautista, female    DOB: 1961/04/18  Age: 63 y.o. MRN: [id number] PCP: Sharma Coyer, MD  Chief Complaint  Patient presents with   URI    Patient was seen at [facility] a few weeks ago for uRI, covid and flu neg. Given abx nasal spray and cough meds. Patient reports no improvement of symptoms. Body aches, cough, congestion with sinus pressure, sore throat, wheezing and SOB    Subjective:     HPI  Discussed the use of AI scribe software for clinical note transcription with the patient, who gave verbal consent to proceed.  History of Present Illness Leslie Bautista is a 63 year old female who presents with an acute cough.  She was seen at urgent care for a cough and was prescribed antibiotics and cough syrup. She tested negative for COVID-19 and influenza at that time. The cough has persisted and is described as 'nasty', with associated sore throat and back pain from coughing. She also reports a 'little wheeze' in her head.  She was informed by urgent care that she had a sinus infection and fluid behind one ear. No ear pain or facial pressure, which she associates with sinus infections. She has not received any steroid treatment yet.  She completed a five-day course of antibiotics, possibly amoxicillin, but did not find relief from the cough syrup provided. Her daughter suggested using hot tea with honey, which she has not been doing regularly.  No ear pain or facial pressure. Sore throat and back pain from coughing, and a 'little wheeze' in her head.   {History (Optional):23778}  ROS    Objective:     BP (!) 153/69 (BP Location: Right Arm, Patient Position: Sitting, Cuff Size: Normal)   Pulse 69   Temp 98.4 F (36.9 C) (Oral)   Ht 5' 3 (1.6 m)   Wt 286 lb (129.7 kg)   LMP 10/15/2019 Comment: Patient states there is no way she is pregnant.   SpO2 98%   BMI 50.66 kg/m  {Vitals History  (Optional):23777}  Physical Exam  Physical Exam GENERAL: Mildly ill appearing, not in respiratory distress. HEENT: Normal oropharynx, no frontal or maxillary sinus tenderness, nose normal. CHEST: Decreased breath sounds bilaterally, no wheezing or crackles. CARDIOVASCULAR: Regular rate and rhythm.   No results found for any visits on 05/20/24.  {Labs (Optional):23779}  The 10-year ASCVD risk score (Arnett DK, et al., 2019) is: 18.7%  .risr   Assessment & Plan:   Problem List Items Addressed This Visit     Acute cough   Relevant Medications   predniSONE  (DELTASONE ) 20 MG tablet (Start on 05/21/2024)   chlorpheniramine-HYDROcodone  (TUSSIONEX) 10-8 MG/5ML   Other Visit Diagnoses       Bronchitis    -  Primary   Relevant Medications   methylPREDNISolone  acetate (DEPO-MEDROL ) injection 40 mg   predniSONE  (DELTASONE ) 20 MG tablet (Start on 05/21/2024)   chlorpheniramine-HYDROcodone  (TUSSIONEX) 10-8 MG/5ML       Assessment and Plan Assessment & Plan Acute cough Worsening symptoms, including sore throat, back pain from coughing, and mild wheezing. Negative for COVID and flu. Previous treatment with antibiotics and cough syrup was ineffective. No sinus tenderness or facial pain, suggesting no sinus infection. Decreased breath sounds bilaterally, no wheezing or crackles. Mildly ill-appearing but not in respiratory distress. - Administered 40 mg steroid injection today. - Start prednisone  40 mg orally tomorrow for 3 days, then 20 mg for 3 days, and taper  to 10 mg. - Monitor blood sugar levels due to potential increase from steroids. - Encouraged hot tea with honey for symptomatic relief. - Prescribed tussionex 10-8mg /84mL, to take every 12 hours PRN for cough     Return in about 3 months (around 08/18/2024) for transfer of care .    Rockie Agent, MD [facility]   "

## 2024-05-22 ENCOUNTER — Encounter: Payer: Self-pay | Admitting: Family Medicine

## 2024-05-27 ENCOUNTER — Ambulatory Visit: Payer: Self-pay

## 2024-05-27 NOTE — Telephone Encounter (Signed)
 Patient call note and symptoms reviewed. Agree with scheduled appt. Will evaluate during OV

## 2024-05-27 NOTE — Telephone Encounter (Signed)
 FYI Only or Action Required?: FYI only for provider: appointment scheduled on 05/28/24, Declined UC.  Patient was last seen in primary care on 05/20/2024 by Sharma Coyer, MD.  Called Nurse Triage reporting Cough.  Symptoms began several weeks ago.  Interventions attempted: OTC medications: Cough, finished steroids previously rx.  Symptoms are: gradually worsening.  Triage Disposition: See HCP Within 4 Hours (Or PCP Triage)  Patient/caregiver understands and will follow disposition?:      Copied from CRM #[id number]. Topic: Clinical - Red Word Triage >> May 27, 2024 10:18 AM Amy B wrote: Red Word that prompted transfer to Nurse Triage: Nausea, severe cough Reason for Disposition  [1] MILD difficulty breathing (e.g., minimal/no SOB at rest, SOB with walking, pulse < 100) AND [2] still present when not coughing  Answer Assessment - Initial Assessment Questions 1. ONSET: When did the cough begin?      End of December 2. SEVERITY: How bad is the cough today?      Has severe coughing spells 3. SPUTUM: Describe the color of your sputum (e.g., none, dry cough; clear, white, yellow, green)     No 4. HEMOPTYSIS: Are you coughing up any blood? If Yes, ask: How much? (e.g., flecks, streaks, tablespoons, etc.)     No 5. DIFFICULTY BREATHING: Are you having difficulty breathing? If Yes, ask: How bad is it? (e.g., mild, moderate, severe)      Mild SOB with conversation 6. FEVER: Do you have a fever? If Yes, ask: What is your temperature, how was it measured, and when did it start?     denies 7. CARDIAC HISTORY: Do you have any history of heart disease? (e.g., heart attack, congestive heart failure)      Hx MI 8. LUNG HISTORY: Do you have any history of lung disease?  (e.g., pulmonary embolus, asthma, emphysema)      9. PE RISK FACTORS: Do you have a history of blood clots? (or: recent major surgery, recent prolonged travel, bedridden)      10. OTHER  SYMPTOMS: Do you have any other symptoms? (e.g., runny nose, wheezing, chest pain)       Runny nose  Protocols used: Cough - Acute Non-Productive-A-AH

## 2024-05-28 ENCOUNTER — Ambulatory Visit
Admission: RE | Admit: 2024-05-28 | Discharge: 2024-05-28 | Disposition: A | Discharge status: Home / Self Care | Attending: Family Medicine | Admitting: Family Medicine

## 2024-05-28 ENCOUNTER — Ambulatory Visit: Admitting: Family Medicine

## 2024-05-28 ENCOUNTER — Encounter: Payer: Self-pay | Admitting: Family Medicine

## 2024-05-28 ENCOUNTER — Ambulatory Visit
Admission: RE | Admit: 2024-05-28 | Discharge: 2024-05-28 | Disposition: A | Discharge status: Home / Self Care | Source: Ambulatory Visit | Attending: Family Medicine | Admitting: Family Medicine

## 2024-05-28 VITALS — BP 134/69 | HR 74 | Resp 14 | Ht 63.0 in

## 2024-05-28 DIAGNOSIS — R490 Dysphonia: Secondary | ICD-10-CM

## 2024-05-28 DIAGNOSIS — R058 Other specified cough: Secondary | ICD-10-CM | POA: Diagnosis not present

## 2024-05-28 DIAGNOSIS — J40 Bronchitis, not specified as acute or chronic: Secondary | ICD-10-CM

## 2024-05-28 MED ORDER — AMOXICILLIN-POT CLAVULANATE 875-125 MG PO TABS: 1.0000 | ORAL_TABLET | Freq: Two times a day (BID) | ORAL | 0 refills | Status: AC

## 2024-05-28 MED ORDER — BUDESONIDE-FORMOTEROL FUMARATE 80-4.5 MCG/ACT IN AERO: 2.0000 | INHALATION_SPRAY | Freq: Two times a day (BID) | RESPIRATORY_TRACT | 3 refills | Status: AC

## 2024-05-28 MED ORDER — PROMETHAZINE-DM 6.25-15 MG/5ML PO SYRP: 5.0000 mL | ORAL_SOLUTION | Freq: Four times a day (QID) | ORAL | 0 refills | Status: AC | PRN

## 2024-05-28 NOTE — Patient Instructions (Addendum)
" °  Please report to [facility] located at:  [address]  [place], [place] [address]  You do not need an appointment to have xrays completed.   Our office will follow up with  results once available.     To keep you healthy, please keep in mind the following health maintenance items that you are due for:   Health Maintenance Due  Topic Date Due   Bone Density Scan  Never done   Zoster Vaccines- Shingrix (1 of 2) Never done   DTaP/Tdap/Td (9 - Td or Tdap) 02/05/2024   Mammogram  05/21/2024     Best Wishes,   Dr. Lang  "

## 2024-05-28 NOTE — Progress Notes (Signed)
 "  Acute Office Visit  Patient ID: Leslie Bautista, female    DOB: Dec 04, 1960, 64 y.o.   MRN: [id number]  PCP: Leslie Coyer, MD  Chief Complaint  Patient presents with   Cough    Patient presents with cough x 2-3 weeks. Treated last week with Tussionex and prednisone .     Subjective:     HPI  Discussed the use of AI scribe software for clinical note transcription with the patient, who gave verbal consent to proceed.  History of Present Illness Leslie Bautista is a 64 year old female who presents with a persistent cough for three weeks.  She has been experiencing a persistent dry cough for three weeks, which has not improved despite treatment with Delsym and a prednisone  taper. The cough is sometimes so severe that she has to pull over while driving to catch her breath. She experiences a sensation of phlegm in her throat that does not come up, and her voice has been intermittently hoarse.  She underwent throat surgery in March or April to remove a 'golf ball' sized mass, and her voice issues began post-surgery. She sometimes experiences a rasping sound but denies consistent wheezing or shortness of breath. Her son has a breathing machine and a steroid inhaler at home, and she was encouraged to use them.  She reports a history of diarrhea when she first got sick, which she attributes to not drinking enough water with her medications. She is concerned about her blood sugar levels, which have been affected by steroid use. No chest pain is reported.   ROS     Objective:    BP 134/69 (Patient Position: Sitting, Cuff Size: Normal)   Pulse 74   Resp 14   Ht 5' 3 (1.6 m)   LMP 10/15/2019 Comment: Patient states there is no way she is pregnant.   SpO2 98%   BMI 50.66 kg/m   BP Readings from Last 3 Encounters:  05/28/24 134/69  05/20/24 (!) 139/56  04/15/24 (!) 142/74    Wt Readings from Last 3 Encounters:  05/20/24 286 lb (129.7 kg)  04/15/24 278 lb (126.1 kg)   04/09/24 282 lb (127.9 kg)      Physical Exam  Physical Exam VITALS: SaO2- 98% CHEST: Clear to auscultation bilaterally. No wheezes or crackles. Lungs clear, air moving well.    No results found for any visits on 05/28/24.     Assessment & Plan:   Problem List Items Addressed This Visit   None Visit Diagnoses       Bronchitis    -  Primary   Relevant Medications   promethazine -dextromethorphan (PROMETHAZINE -DM) 6.25-15 MG/5ML syrup   budesonide -formoterol  (SYMBICORT ) 80-4.5 MCG/ACT inhaler   Other Relevant Orders   DG Chest 2 View     Hoarse voice quality         Non-productive cough       Relevant Medications   promethazine -dextromethorphan (PROMETHAZINE -DM) 6.25-15 MG/5ML syrup   budesonide -formoterol  (SYMBICORT ) 80-4.5 MCG/ACT inhaler   amoxicillin -clavulanate (AUGMENTIN ) 875-125 MG tablet        Assessment & Plan Post-infectious cough Subacute Persistent cough for three weeks, likely post-infectious. Oxygen saturation is 98% on room air. No wheezing or crackles on auscultation. Differential includes post-infectious cough, vocal cord issues, or reflux. Previous treatment with Delsym and prednisone  taper was ineffective due to cost and availability issues. Discussed potential use of Symbicort  inhaler with caution due to risk of pneumonia. Promethazine  cough syrup prescribed with caution due to sedative effects. Augmentin  prescribed  as a contingency for potential pneumonia, but advised to wait for chest x-ray results before starting. - Ordered chest x-ray at outpatient imaging center - Prescribed Symbicort  80-4.17mcg  inhaler, 2 puffs twice daily  - Prescribed promethazine  cough syrup every 4 hours PRN for cough - Advised to start Augmentin  875-125mg  BID for 7 day course if symptoms worsen or if chest x-ray indicates pneumonia - Encouraged hydration to prevent dehydration and diarrhea  Hoarse voice Intermittent hoarse voice, possibly related to previous throat  surgery or vocal cord issues. ENT follow-up recommended for further evaluation of vocal cords. - Advised to follow up with ENT specialist for vocal cord evaluation    Meds ordered this encounter  Medications   promethazine -dextromethorphan (PROMETHAZINE -DM) 6.25-15 MG/5ML syrup    Sig: Take 5 mLs by mouth 4 (four) times daily as needed.    Dispense:  240 mL    Refill:  0   budesonide -formoterol  (SYMBICORT ) 80-4.5 MCG/ACT inhaler    Sig: Inhale 2 puffs into the lungs 2 (two) times daily.    Dispense:  1 each    Refill:  3   amoxicillin -clavulanate (AUGMENTIN ) 875-125 MG tablet    Sig: Take 1 tablet by mouth 2 (two) times daily for 7 days.    Dispense:  14 tablet    Refill:  0    Return if symptoms worsen or fail to improve, for cough .  Leslie Agent, MD [facility]   "

## 2024-05-30 ENCOUNTER — Ambulatory Visit: Attending: Orthopedic Surgery | Admitting: Occupational Therapy

## 2024-05-30 ENCOUNTER — Encounter: Payer: Self-pay | Admitting: Occupational Therapy

## 2024-05-30 DIAGNOSIS — M25642 Stiffness of left hand, not elsewhere classified: Secondary | ICD-10-CM | POA: Insufficient documentation

## 2024-05-30 DIAGNOSIS — M25641 Stiffness of right hand, not elsewhere classified: Secondary | ICD-10-CM | POA: Insufficient documentation

## 2024-05-30 DIAGNOSIS — M79641 Pain in right hand: Secondary | ICD-10-CM | POA: Insufficient documentation

## 2024-05-30 NOTE — Therapy (Signed)
 " OUTPATIENT OCCUPATIONAL THERAPY ORTHO TREATMENT/ RECERTIFICATION   Dates of reporting period  03/03/24   to   05/30/24   Patient Name: Leslie Bautista MRN: [id number] DOB:[birth year], 64 y.o., female Today's Date: 05/30/2024  PCP: Rockie Agent, MD REFERRING PROVIDER: Dr Kathlynn  END OF SESSION:  OT End of Session - 05/30/24 1355     Visit Number 15    Number of Visits 16    Date for Recertification  07/11/24    OT Start Time 0915    OT Stop Time 0940    OT Time Calculation (min) 25 min    Activity Tolerance Patient tolerated treatment well    Behavior During Therapy [facility] for tasks assessed/performed              Past Medical History:  Diagnosis Date   Allergy    Cancer (HCC)    breast cancer on left side   Cataract    Depression    Diabetes mellitus without complication (HCC)    Hypertension    Morbid obesity with BMI of 50.0-59.9, adult (HCC)    Obstructive sleep apnea    Osteoarthritis of both knees    Pre-diabetes    Sleep apnea    Past Surgical History:  Procedure Laterality Date   ANKLE SURGERY Right    ligament repair   BREAST SURGERY     CATARACT EXTRACTION, BILATERAL Bilateral 2022   COLONOSCOPY  2015   COLONOSCOPY WITH PROPOFOL  N/A 07/26/2023   Procedure: COLONOSCOPY WITH PROPOFOL ;  Surgeon: Unk Corinn Skiff, MD;  Location: [facility] ENDOSCOPY;  Service: Gastroenterology;  Laterality: N/A;   EYE SURGERY     JOINT REPLACEMENT Right    Knee   POLYPECTOMY  07/26/2023   Procedure: POLYPECTOMY;  Surgeon: Unk Corinn Skiff, MD;  Location: [facility] ENDOSCOPY;  Service: Gastroenterology;;   TONSILLECTOMY Bilateral 06/14/2021   Procedure: TONSILLECTOMY;  Surgeon: Milissa Hamming, MD;  Location: [facility] ORS;  Service: ENT;  Laterality: Bilateral;   TOTAL KNEE ARTHROPLASTY Right    TUBAL LIGATION     Patient Active Problem List   Diagnosis Date Noted   Abnormal MRI, cervical spine (01/15/2023) 04/09/2024   Chronic feet pain (Bilateral) 03/10/2024    Chronic and pain (Bilateral) 03/10/2024   Numbness and tingling in both hands 03/10/2024   Non-articular hand pain (Bilateral) 03/10/2024   Multifactorial peripheral neuropathy 03/10/2024   Sensory peripheral neuropathy 03/10/2024   Abnormal NCS (nerve conduction studies) (11/07/2023) 03/10/2024   Diabetic sensorimotor polyneuropathy (HCC) 03/10/2024   Polyneuropathy, peripheral sensorimotor axonal 03/10/2024   Chronic low back pain (2ry area of Pain) (Right) w/o sciatica 03/10/2024   Lumbar facet joint pain 03/10/2024   Psychophysiological insomnia 02/19/2024   Chronic pain syndrome 02/04/2024   Pharmacologic therapy 02/04/2024   Disorder of skeletal system 02/04/2024   Problems influencing health status 02/04/2024   Diabetes mellitus type II, non insulin dependent (HCC) 09/25/2023   Cervical osteophyte 08/23/2023   Oropharyngeal dysphagia 08/23/2023   Chemotherapy-induced neuropathy 08/02/2023   Hx of colonic polyps 07/26/2023   Polyp of ascending colon 07/26/2023   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   Tenesmus (rectal) 06/29/2023   Chronic lower extremity pain (1ry area of Pain) (Bilateral) 06/29/2023   Acute cough 06/29/2023   Need for hepatitis C screening test 06/29/2023   Screening for deficiency anemia 06/29/2023   Encounter to establish care with new doctor 06/29/2023   Neuropathy 06/29/2023   Chemotherapy-induced neutropenia 12/05/2022   Thrombocytopenia 12/05/2022   Lower urinary tract infectious disease 12/03/2022  Myocardial injury 12/03/2022   Obesity, Class III, BMI 40-49.9 (morbid obesity) (HCC) 12/03/2022   Breast cancer (HCC) 12/03/2022   Hyperlipidemia associated with type 2 diabetes mellitus (HCC) 12/03/2022   OSA on CPAP 12/03/2022   Hypersensitivity reaction 10/24/2022   Invasive ductal carcinoma of breast, left (HCC) 09/26/2022   Endometrial thickening on ultrasound 07/05/2021   CKD stage G3a/A1, 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)  12/10/2019   Dyspareunia due to medical condition in female 10/24/2019   Postmenopausal bleeding 03/08/2018   Pre-diabetes 11/22/2015   Depression 06/03/2014   Class 3 severe obesity without serious comorbidity with body mass index (BMI) of 50.0 to 59.9 in adult [facility]) 01/07/2014   Severe obesity (HCC) 01/07/2014   Hypertension associated with diabetes (HCC) 10/25/2011   Primary localized osteoarthrosis, lower leg 12/19/2007    ONSET DATE: 3-6 months  REFERRING DIAG: Bilateral hand stiffness and pain mostly 3rd through 5th digits  THERAPY DIAG:  Stiffness of right hand, not elsewhere classified  Stiffness of left hand, not elsewhere classified  Pain in right hand  Rationale for Evaluation and Treatment: Rehabilitation  SUBJECTIVE:   SUBJECTIVE STATEMENT: I have been triggering in both ring fingers multiple times a day and numb in 3-5th digits R hand.   Pt accompanied by: self  PERTINENT HISTORY: 01/03/24 Dr Kathlynn visit :  DIAGNOSTIC Upper extremity nerve conduction study: Chronic mild sensory polyneuropathy (11/07/2023) Lower extremity nerve conduction study: Chronic severe sensorimotor polyneuropathy (11/06/2023)    Assessment/Plan:   Assessment & Plan Bilateral hand stiffness and weakness  Chronic bilateral hand stiffness and weakness with difficulty making a fist. Negative for carpal tunnel syndrome and trigger finger. Suspected inflammatory problem affecting tendons, not related to bones or nerves. Possible rheumatologic condition considered, though no joint swelling observed. Diabetes is well-controlled with an A1c of 5.8, and impaired kidney function precludes anti-inflammatory medications. Refer to hand therapy at [facility] outpatient facility on [address] for therapy twice a week for a month. Instruct to contact [name], the hand therapist, directly for specialized care. Advise to call back if no improvement after a month for re-evaluation. Plan for potential  follow-up with a hand specialist, Dr. Ezra, if symptoms persist.  Mild bilateral hand osteoarthritis  Mild osteoarthritis in hand joints, not severe enough to explain current stiffness and weakness. X-rays show minimal arthritis, particularly in the thumb. Diagnoses and all orders for this visit:  Stiffness of joints of both hands - Ambulatory Referral to Occupational Therapy   PRECAUTIONS: 10 pound limit because of cervical surgery about 3 months ago   WEIGHT BEARING RESTRICTIONS: No  PAIN:  Are you having pain?  8/10 pain/numbness in R worse than left 3-5th digits, denies pain at A1 pulley  FALLS: Has patient fallen in last 6 months? No  LIVING ENVIRONMENT: Lives with: lives with their family   PLOF: Patient on disability.  Patient had breast cancer treatment and then had about 3 months ago cervical surgery.  Patient perform her own ADLs and helps at home with some cleaning some cooking and laundry.  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  PATIENT GOALS: I want the stiffness and the pain better so I can use my hands doing laundry, cooking, bathing, driving.    OBJECTIVE:  Note: Objective measures were completed at Evaluation unless otherwise noted.  HAND DOMINANCE: Left  ADLs: Unable to grasp to cook or laundry or cleaning cannot make  a composite fist with increased pain and stiffness in the palm, fingers and forearm-difficulty with bathing and dressing and driving to  FUNCTIONAL OUTCOME MEASURES: To do next session UPPER EXTREMITY ROM:     Active ROM Right eval Right 03/03/24 Left eval Left 03/03/24 L  05/30/24  Shoulder flexion       Shoulder abduction       Shoulder adduction       Shoulder extension       Shoulder internal rotation       Shoulder external rotation       Elbow flexion       Elbow extension       Wrist flexion       Wrist extension 60 60 55 55 60  Wrist ulnar deviation       Wrist radial  deviation       Wrist pronation       Wrist supination       (Blank rows = not tested)  Active ROM Right eval Left eval R/L 01/14/24 R/L 01/24/24 R/L  03/03/24 R/L 05/30/24  Thumb MCP (0-60)        Thumb IP (0-80)        Thumb Radial abd/add (0-55)         Thumb Palmar abd/add (0-45)         Thumb Opposition to Small Finger         Index MCP (0-90) 65 75  70/75 60/75 65/75  75/75  Index PIP (0-100) 95  90 100/95 100/100 100/95 95/95  Index DIP (0-70)          Long MCP (0-90) 70  80  85/90 90/90 95/95  90/90  Long PIP (0-100)  90 90  100/90 100/95 100/100 100/100  Long DIP (0-70)          Ring MCP (0-90) 75  90  85/90 85/90 88/90  95/90  Ring PIP (0-100)  90  90 100/90 100/95 100/105 90/93  Ring DIP (0-70)          Little MCP (0-90)  80  90 85/90 90/90 95/95  85/90  Little PIP (0-100)  90 85  95/90 100/95 105/105 90/95  Little DIP (0-70)          (Blank rows = not tested) Patient with increased pain in the left more than the right with attempts of composite fist.  HAND FUNCTION:  Grip strength: Right: 26 lbs; Left: 24 lbs, Lateral pinch: Right: 12 lbs, Left: 6 lbs, and 3 point pinch: Right: 9 lbs, Left: 5 lbs 01/14/24  Grip strength: Right: 35 lbs; Left: 24 lbs, Lateral pinch: Right: 12 lbs, Left: 12 lbs, and 3 point pinch: Right: 9 lbs, Left: 5 lbs 01/24/24  Grip strength: Right: 38 lbs; Left: 26 lbs, Lateral pinch: Right: 14 lbs, Left: 12 lbs, and 3 point pinch: Right: 10 lbs, Left: 8 lbs 03/03/24  Grip strength: Right: 40 lbs; Left: 40 lbs, Lateral pinch: Right: 14 lbs, Left: 10 lbs, and 3 point pinch: Right: 10 lbs, Left: 8 lbs 05/30/24:  Grip strength: Right: 30 lbs; Left: 30 lbs, Lateral pinch: Right: 11 lbs, Left: 12 lbs, and 3 point pinch: Right: 10 lbs, Left: 9 lbs  COORDINATION: Decreased because of increased stiffness in digits unable to make composite fist.  And pain in digit with 3-point pinch with 2-point pinch  SENSATION: Since chemotherapy patient reports no neuropathy in  all digits  EDEMA: Patient denies swelling  COGNITION: Overall cognitive status: Within functional  limits for tasks assessed     TREATMENT DATE: 05/30/24   Pt arrives with complaints of numbness/pain to R3-5th digits, pain to L 3-4th digits. Continues to report triggering multiple times per day - endorses continues to do activities with sustained grip, especially driving and phone use.  Noted +Tinel sign R side.   Pt has made progress with lateral/3pt pinch strength and AROM in B hands, no change to grip strength. Arrives today with no pain /tenderness at A1 pulley, but increased pain 8/10 R 3rd-5th digits, endorses numbness. 2 episodes of triggering noted - pt continues to repeatedly flex hands while at rest.   Reviewed with patient again the importance of using heat in the morning prior to gentle passive range of motion to DIP PIP of digits and gentle composite flexion prior to attempts of active fist. Issued red foam roller for forearm massage at home.   Reviewed joint protection strategies and home modifications.   Joint Protection Respect pain/Develop pain awareness Monitor activities - stop to rest when discomfort or fatigue develops Pain lingering 2 hrs after activity is a warning sign - modify or eliminate Use larger joints and muscles Protect the delicate joints by using larger joints - use shoulder to carry bags not hands More proximal muscles and joints to perform ADLs (forearm/shoulder to open door-protect fingers) Avoid tight/strong and prolonged grasp Build up handles (foam or cloth) on utensils, tools and pens Use open, flat palm to open jars, pump bottles for shampoo/soaps Avoid carrying, lifting and using heavy objects Push heavy objects with hips instead of pulling with hands Distribute weight over many joints - use 2 hands to lift an item Balance rest and activity Avoid activities that cannot be stopped if needed   PATIENT EDUCATION: Education details:  findings of eval and HEP  Person educated: Patient Education method: Explanation, Demonstration, Tactile cues, Verbal cues, and Handouts Education comprehension: verbalized understanding, returned demonstration, verbal cues required, and needs further education    GOALS: Goals reviewed with patient? Yes    LONG TERM GOALS: Target date: 6 weeks  Patient to be independent in home program to decrease pain with home exercises to less than 5/10 to be able to increase motion Baseline: Pain 9/10 with decreased MCP flexion on the right 65-80 and PIP is 90-95.  Left MC 75-90 and PIP is 90 to 85 degrees. 03/03/24: pain 5/10 with MCP flexion R 65-95 and PIP 100-105, L MCP 75-95 and PIP 95-105 Goal status: Met  2.  Bilateral digits flexion improved for patient to be able to touch palm with pain less than 2/10 with cutting food, brushing her teeth and driving. Baseline: Patient limited in flexion of all digits with increased pain over the metacarpals as well as the volar palm and forearm.  With increased pain to 9/10 with use. 03/03/24: achieves full fist touching palm with 5/10 pain.  Goal status: Progressing  3.  Patient's pain in bilateral metacarpals as well as palm of forearm decreased to 2/10 to improve active range of motion of bilateral hands and wrist within functional limits Baseline: Pain 9/10 with decreased motion and decreased strength. 03/03/24: 5/10 pain.  Goal status: Met  4.  Bilateral grip and prehension strength improved to within normal range for her age to be able to improve her functional use in cooking and laundry symptom free Baseline: Right grip 26 pounds and left 24 pounds with pain.  Lateral pinch 12 pounds on the right and left 6 pounds and 3 point  pinch 9 on the right left 5 pounds.  With increased pain with functional use. 03/03/24: R grip L and R 40lbs. Lateral pinch R 14lbs, L 10 lbs.  Goal status: Progressing   ASSESSMENT:  CLINICAL IMPRESSION: Patient seen  for  occupational therapy for bilateral hand and finger stiffness.  With increased pain.  Left worse than the right.  Patient is left-handed.  Patient had breast cancer with treatment of chemotherapy and report some neuropathy in all digits.  Patient also had cervical surgery last few months patient with some arthritic changes to bilateral hands.  Patient present to OT evaluation with decreased AROM in all digits with 3rd through 5th the worst.  But patient also limited in second digit avoiding composite fist but using more lateral and 3-point pinch.  Patient's left hand pain worse than the right but motion wise right worse than the left.  See flowsheets above for motion.  Patient with increased pain over the metacarpals into the palm as well as the volar forearm.  Thumb range of motion within normal limits and denies any pain.    NOW Pt has made progress across therapy session with lateral/3pt pinch strength and AROM in B hands, no change to grip strength. Arrives today with no pain /tenderness at A1 pulley, but increased pain 8/10 R 3rd-5th digits, endorses numbness. + Tinel sign. 2 episodes of triggering noted - pt continues to repeatedly flex hands while at rest. Reviewed with patient again the importance of using heat in the morning prior to gentle passive range of motion to DIP PIP of digits and gentle composite flexion prior to attempts of active fist. Avoid over gripping activities with pt stating she has to drive and perform IADLs. Issued red foam roller for forearm massage at home. Reports having scheduled ortho follow up next week, will schedule for following week to re-assess per MD. Patient limited in functional use of bilateral hands and ADLs and IADLs.  Patient can benefit from skilled OT services to decrease pain and stiffness and increased motion and strength to return to prior level of function.  PERFORMANCE DEFICITS: in functional skills including ADLs, IADLs, ROM, strength, pain, flexibility,  decreased knowledge of use of DME, and UE functional use,   and psychosocial skills including environmental adaptation and routines and behaviors.   IMPAIRMENTS: are limiting patient from ADLs, IADLs, rest and sleep, play, leisure, and social participation.   COMORBIDITIES: has no other co-morbidities that affects occupational performance. Patient will benefit from skilled OT to address above impairments and improve overall function.  MODIFICATION OR ASSISTANCE TO COMPLETE EVALUATION: No modification of tasks or assist necessary to complete an evaluation.  OT OCCUPATIONAL PROFILE AND HISTORY: Problem focused assessment: Including review of records relating to presenting problem.  CLINICAL DECISION MAKING: LOW - limited treatment options, no task modification necessary  REHAB POTENTIAL: Good for goals  EVALUATION COMPLEXITY: Low      PLAN:  OT FREQUENCY: 1x/week  OT DURATION: 6 wks  PLANNED INTERVENTIONS: 97168 OT Re-evaluation, 97535 self care/ADL training, 02889 therapeutic exercise, 97530 therapeutic activity, 97112 neuromuscular re-education, 97140 manual therapy, 97035 ultrasound, 97018 paraffin, 02960 fluidotherapy, 97010 moist heat, 97760 Orthotic Initial, [id number] Orthotic/Prosthetic subsequent, patient/family education, and DME and/or AE instructions    CONSULTED AND AGREED WITH PLAN OF CARE: Patient     Elston JINNY Slot, OTR/L  05/30/2024, 1:57 PM "

## 2024-06-02 ENCOUNTER — Ambulatory Visit: Admitting: Podiatry

## 2024-06-05 ENCOUNTER — Other Ambulatory Visit: Payer: Self-pay | Admitting: Otolaryngology

## 2024-06-05 DIAGNOSIS — R1310 Dysphagia, unspecified: Secondary | ICD-10-CM

## 2024-06-05 DIAGNOSIS — R221 Localized swelling, mass and lump, neck: Secondary | ICD-10-CM

## 2024-06-06 ENCOUNTER — Ambulatory Visit: Payer: Self-pay | Admitting: Family Medicine

## 2024-06-08 NOTE — Progress Notes (Unsigned)
 "   06/09/2024 Leslie Bautista [id number] 09-28-1960  Gastroenterology Office Note     Primary Care Physician:  Sharma Coyer, MD  Primary GI Provider: Celestia Rima, NP; Jinny Carmine, MD    Chief Complaint   Chief Complaint  Patient presents with   Follow-up    uroynecology- March 10- things are about the same     History of Present Illness   Leslie Bautista is a 64 y.o. female presenting today for follow up.  Discussed the use of AI scribe software for clinical note transcription with the patient, who gave verbal consent to proceed.  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  She continues to experience intermittent passage of what appears to be stool during urination. The frequency of this symptom has lessened but not resolved. She sometimes observes stool sitting on top of the water when urinating and describes the sensation of urinating and passing stool simultaneously. No vaginal irritation, itching, or discharge reported.  Bowel movements occur almost daily, sometimes up to three or four times per day. Stools are soft, Bristol Stool Scale type 4, without watery consistency, No constipation. She does not use fiber supplements or Miralax . Dietary fiber intake is described as adequate, with regular consumption of green vegetables and fruits. No ongoing or daily abdominal pain  MRI of the pelvis on November 24th and colonoscopy in March of the previous year were unremarkable. She had seen Gyn and now awaiting a urogynecology appointment on March 10th.   04/14/2024 MRI Pelvis showed no fistulous tract between the rectum/distal sigmoid colon and reproductive organs.   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 Barium enema recommended but this was not  done.     08/07/2023 seen by GYN on 08/07/2023 for concerns of passing stool through her vagina.    07/26/2023 Colonoscopy  Patient last seen by Dr. Unk on 07/17/2023 with intermittent passing of stool from the front/vagina in addition to rectum. Colonoscopy scheduled.   Past Medical History:  Diagnosis Date   Allergy    Cancer (HCC)    breast cancer on left side   Cataract    Depression    Diabetes mellitus without complication (HCC)    Hypertension    Morbid obesity with BMI of 50.0-59.9, adult (HCC)    Obstructive sleep apnea    Osteoarthritis of both knees    Pre-diabetes    Sleep apnea     Past Surgical History:  Procedure Laterality Date   ANKLE SURGERY Right    ligament repair   BREAST SURGERY     CATARACT EXTRACTION, BILATERAL Bilateral 2022   COLONOSCOPY  2015   COLONOSCOPY WITH PROPOFOL  N/A 07/26/2023   Procedure: COLONOSCOPY WITH PROPOFOL ;  Surgeon: Unk Corinn Skiff, MD;  Location: [facility];  Service: Gastroenterology;  Laterality: N/A;   EYE SURGERY     JOINT REPLACEMENT Right    Knee   POLYPECTOMY  07/26/2023   Procedure: POLYPECTOMY;  Surgeon: Unk Corinn Skiff, MD;  Location: [facility] END[facility];  Service: Gastroenterology;;   TONSILLECTOMY Bilateral 06/14/2021   Procedure: TONSILLECTOMY;  Surgeon: Milissa Hamming, MD;  Location: [facility] ORS;  Service: ENT;  Laterality: Bilateral;   TOTAL KNEE ARTHROPLASTY Right    TUBAL LIGATION      Current Outpatient Medications  Medication Sig Dispense Refill   anastrozole (  ARIMIDEX) 1 MG tablet Take 1 mg by mouth daily.     atorvastatin  (LIPITOR) 20 MG tablet Take 1 tablet (20 mg total) by mouth at bedtime. 90 tablet 1   budesonide -formoterol  (SYMBICORT ) 80-4.5 MCG/ACT inhaler Inhale 2 puffs into the lungs 2 (two) times daily. 1 each 3   chlorpheniramine-HYDROcodone  (TUSSIONEX) 10-8 MG/5ML Take 5 mLs by mouth every 12 (twelve) hours as needed for cough. 115 mL 0   DULoxetine (CYMBALTA) 30 MG capsule Take 30 mg  by mouth daily.     gabapentin  (NEURONTIN ) 300 MG capsule Take one 300mg  capsule in the morning and at night take one 300 mg tablet plus 600 tablet. 180 capsule 2   gabapentin  (NEURONTIN ) 600 MG tablet Take 600 mg by mouth 2 (two) times daily.     losartan -hydrochlorothiazide (HYZAAR) 50-12.5 MG tablet TAKE 1 TABLET BY MOUTH EVERY DAY 90 tablet 1   meloxicam  (MOBIC ) 7.5 MG tablet Take 7.5 mg by mouth 2 (two) times daily.     neomycin -polymyxin-hydrocortisone (CORTISPORIN) OTIC solution 1-2 drops to toe twice daily after soaking 10 mL 0   nortriptyline (PAMELOR) 10 MG capsule Take 20 mg by mouth at bedtime.     pantoprazole  (PROTONIX ) 40 MG tablet Take 40 mg by mouth daily.     promethazine -dextromethorphan (PROMETHAZINE -DM) 6.25-15 MG/5ML syrup Take 5 mLs by mouth 4 (four) times daily as needed. 240 mL 0   Semaglutide  (RYBELSUS ) 3 MG TABS Take by mouth.     Semaglutide  (RYBELSUS ) 7 MG TABS Take 1 tablet (7 mg total) by mouth daily. 90 tablet 1   traZODone  (DESYREL ) 50 MG tablet Take 1 tablet (50 mg total) by mouth at bedtime. 90 tablet 1   No current facility-administered medications for this visit.    Allergies as of 06/09/2024 - Review Complete 06/09/2024  Allergen Reaction Noted   Metformin  and related Diarrhea 07/03/2023   Oxycontin  [oxycodone  hcl] Rash 09/29/2014    Family History  Problem Relation Age of Onset   Pancreatic cancer Mother    Prostate cancer Father    Hypertension Sister    Hypertension Brother     Social History   Socioeconomic History   Marital status: Single    Spouse name: Not on file   Number of children: 2   Years of education: Not on file   Highest education level: Not on file  Occupational History   Not on file  Tobacco Use   Smoking status: Never   Smokeless tobacco: Never  Vaping Use   Vaping status: Never Used  Substance and Sexual Activity   Alcohol use: Not Currently   Drug use: No   Sexual activity: Not Currently    Birth  control/protection: Post-menopausal  Other Topics Concern   Not on file  Social History Narrative   Lives with daughters and grand child   Social Drivers of Health   Tobacco Use: Low Risk (06/09/2024)   Patient History    Smoking Tobacco Use: Never    Smokeless Tobacco Use: Never    Passive Exposure: Not on file  Financial Resource Strain: Low Risk  (06/05/2024)   Received from [facility]   Overall Financial Resource Strain (CARDIA)    Difficulty of Paying Living Expenses: Not hard at all  Food Insecurity: No Food Insecurity (06/05/2024)   Received from [facility]   Epic    Within the past 12 months, you worried that your food would run out before you got the money to  buy more.: Never true    Within the past 12 months, the food you bought just didn't last and you didn't have money to get more.: Never true  Transportation Needs: Unmet Transportation Needs (06/05/2024)   Received from [facility] - Transportation    In the past 12 months, has lack of transportation kept you from medical appointments or from getting medications?: Yes    Lack of Transportation (Non-Medical): Yes  Physical Activity: Inactive (08/01/2023)   Exercise Vital Sign    Days of Exercise per Week: 0 days    Minutes of Exercise per Session: 0 min  Stress: No Stress Concern Present (08/01/2023)   [employer] - Occupational Stress Questionnaire    Feeling of Stress : Not at all  Social Connections: Moderately Isolated (08/01/2023)   Social Connection and Isolation Panel    Frequency of Communication with Friends and Family: More than three times a week    Frequency of Social Gatherings with Friends and Family: More than three times a week    Attends Religious Services: More than 4 times per year    Active Member of Clubs or Organizations: No    Attends Banker Meetings: Never    Marital Status: Never married   Intimate Partner Violence: Not At Risk (08/01/2023)   Humiliation, Afraid, Rape, and Kick questionnaire    Fear of Current or Ex-Partner: No    Emotionally Abused: No    Physically Abused: No    Sexually Abused: No  Depression (PHQ2-9): Medium Risk (03/26/2024)   Depression (PHQ2-9)    PHQ-2 Score: 7  Alcohol Screen: Low Risk (08/01/2023)   Alcohol Screen    Last Alcohol Screening Score (AUDIT): 0  Housing: High Risk (06/05/2024)   Received from [facility]   Epic    In the last 12 months, was there a time when you were not able to pay the mortgage or rent on time?: Yes    In the past 12 months, how many times have you moved where you were living?: 0    At any time in the past 12 months, were you homeless or living in a shelter (including now)?: No  Utilities: Not At Risk (06/05/2024)   Received from [facility]   Epic    In the past 12 months has the electric, gas, oil, or water company threatened to shut off services in your home?: No  Health Literacy: Adequate Health Literacy (08/01/2023)   B1300 Health Literacy    Frequency of need for help with medical instructions: Never     RELEVANT GI HISTORY, IMAGING AND LABS: CBC    Component Value Date/Time   WBC 6.3 08/16/2023 1218   RBC 4.08 08/16/2023 1218   HGB 11.8 (L) 08/16/2023 1218   HGB 11.7 06/29/2023 1035   HCT 36.6 08/16/2023 1218   HCT 35.7 06/29/2023 1035   PLT 171 08/16/2023 1218   PLT 165 06/29/2023 1035   MCV 89.7 08/16/2023 1218   MCV 85 06/29/2023 1035   MCV 88 06/21/2014 0402   MCH 28.9 08/16/2023 1218   MCHC 32.2 08/16/2023 1218   RDW 14.6 08/16/2023 1218   RDW 14.8 06/29/2023 1035   RDW 14.2 06/21/2014 0402   LYMPHSABS 1.3 06/29/2023 1035   MONOABS 0.3 12/26/2022 1403   EOSABS 0.1 06/29/2023 1035   BASOSABS 0.0 06/29/2023 1035   Recent Labs    06/29/23 1035 07/19/23 1248 08/16/23 1218  HGB 11.7 12.4 11.8*    CMP     Component Value Date/Time   NA 137  03/10/2024 1022   NA 139 06/21/2014 0402   K 4.8 03/10/2024 1022   K 3.0 (L) 06/21/2014 0402   CL 98 03/10/2024 1022   CL 103 06/21/2014 0402   CO2 23 11/15/2023 1118   CO2 30 06/21/2014 0402   GLUCOSE 94 03/10/2024 1022   GLUCOSE 98 08/16/2023 1218   GLUCOSE 137 (H) 06/21/2014 0402   BUN 21 03/10/2024 1022   BUN 19 (H) 06/21/2014 0402   CREATININE 1.26 (H) 03/10/2024 1022   CREATININE 1.23 06/21/2014 0402   CALCIUM  10.1 03/10/2024 1022   CALCIUM  9.1 06/21/2014 0402   PROT 7.6 03/10/2024 1022   PROT 8.4 (H) 06/21/2014 0402   ALBUMIN 4.0 03/10/2024 1022   ALBUMIN 3.3 (L) 06/21/2014 0402   AST 27 03/10/2024 1022   AST 44 (H) 06/21/2014 0402   ALT 9 11/15/2023 1118   ALT 26 06/21/2014 0402   ALKPHOS 142 (H) 03/10/2024 1022   ALKPHOS 103 06/21/2014 0402   BILITOT 0.5 03/10/2024 1022   BILITOT 0.2 06/21/2014 0402   GFRNONAA 58 (L) 08/16/2023 1218   GFRNONAA 49 (L) 06/21/2014 0402   GFRAA 48 (L) 10/22/2018 1435   GFRAA 59 (L) 06/21/2014 0402      Latest Ref Rng & Units 03/10/2024   10:22 AM 11/15/2023   11:18 AM 08/16/2023   12:18 PM  Hepatic Function  Total Protein 6.0 - 8.5 g/dL 7.6  6.9  8.0   Albumin 3.9 - 4.9 g/dL 4.0  3.8  3.9   AST 0 - 40 IU/L 27  16  19    ALT 0 - 32 IU/L  9  16   Alk Phosphatase 49 - 135 IU/L 142  119  94   Total Bilirubin 0.0 - 1.2 mg/dL 0.5  0.3  0.5       Review of Systems   All systems reviewed and negative except where noted in HPI.    Physical Exam  BP 113/75   Pulse 70   Temp 99 F (37.2 C)   Ht 5' 3 (1.6 m)   Wt 289 lb 6.4 oz (131.3 kg)   LMP 10/15/2019 Comment: Patient states there is no way she is pregnant.   SpO2 98%   BMI 51.26 kg/m  Patient's last menstrual period was 10/15/2019. General:   Alert and oriented. Pleasant and cooperative. Well-nourished and well-developed. In no acute distress.  Head:  Normocephalic and atraumatic. Eyes:  Without icterus Ears:  Normal auditory acuity. Rectal:  Deferred. Msk:   Symmetrical without gross deformities. Normal posture. Extremities:  Without edema. Neurologic:  Alert and  oriented x4;  grossly normal neurologically. Skin:  Intact without significant lesions or rashes. Psych:  Alert and cooperative. Normal mood and affect.   Assessment & Plan   Toneisha Savary is a 64 y.o. female presenting today for follow up. She has underwent colonoscopy and most recently MRI of the pelvis with no fistulous tract between the rectum/distal sigmoid colon and reproductive organs.  From GI standpoint, no abnormality has been identified and patient will keep scheduled visit in March with urogynecology and keep follow-up appointments with GYN.  Patient does report sometimes her stools float on top of the water, will check for fecal elastase.  Instructed patient to increase fiber intake if stools become looser or more frequent.   Follow-up as needed pending labs     Grayce Bohr,  DNP, AGNP-C [place]  [facility]   "

## 2024-06-09 ENCOUNTER — Encounter: Payer: Self-pay | Admitting: Family Medicine

## 2024-06-09 ENCOUNTER — Ambulatory Visit: Admitting: Family Medicine

## 2024-06-09 VITALS — BP 113/75 | HR 70 | Temp 99.0°F | Ht 63.0 in | Wt 289.4 lb

## 2024-06-09 DIAGNOSIS — K909 Intestinal malabsorption, unspecified: Secondary | ICD-10-CM

## 2024-06-09 DIAGNOSIS — K529 Noninfective gastroenteritis and colitis, unspecified: Secondary | ICD-10-CM | POA: Diagnosis not present

## 2024-06-10 ENCOUNTER — Ambulatory Visit
Admission: RE | Admit: 2024-06-10 | Discharge: 2024-06-10 | Disposition: A | Source: Ambulatory Visit | Attending: Otolaryngology | Admitting: Otolaryngology

## 2024-06-10 DIAGNOSIS — R221 Localized swelling, mass and lump, neck: Secondary | ICD-10-CM

## 2024-06-10 DIAGNOSIS — R1310 Dysphagia, unspecified: Secondary | ICD-10-CM

## 2024-06-10 MED ORDER — IOPAMIDOL (ISOVUE-300) INJECTION 61%
75.0000 mL | Freq: Once | INTRAVENOUS | Status: AC | PRN
  Administered 2024-06-10: 75 mL via INTRAVENOUS

## 2024-06-11 ENCOUNTER — Other Ambulatory Visit: Payer: Self-pay

## 2024-06-11 ENCOUNTER — Ambulatory Visit: Admitting: Podiatry

## 2024-06-16 ENCOUNTER — Ambulatory Visit: Admitting: Occupational Therapy

## 2024-06-20 ENCOUNTER — Ambulatory Visit: Admitting: Occupational Therapy

## 2024-06-24 ENCOUNTER — Inpatient Hospital Stay: Admission: RE | Admit: 2024-06-24 | Discharge: 2024-06-24 | Disposition: A | Source: Ambulatory Visit

## 2024-06-24 ENCOUNTER — Other Ambulatory Visit: Payer: Self-pay

## 2024-06-24 DIAGNOSIS — M79642 Pain in left hand: Secondary | ICD-10-CM

## 2024-06-24 DIAGNOSIS — E1159 Type 2 diabetes mellitus with other circulatory complications: Secondary | ICD-10-CM

## 2024-06-24 DIAGNOSIS — Z01810 Encounter for preprocedural cardiovascular examination: Secondary | ICD-10-CM

## 2024-06-24 DIAGNOSIS — N1831 Chronic kidney disease, stage 3a: Secondary | ICD-10-CM

## 2024-06-24 DIAGNOSIS — E119 Type 2 diabetes mellitus without complications: Secondary | ICD-10-CM

## 2024-06-24 HISTORY — DX: Cardiac murmur, unspecified: R01.1

## 2024-06-24 HISTORY — DX: Acute myocardial infarction, unspecified: I21.9

## 2024-06-24 HISTORY — DX: Chronic kidney disease, unspecified: N18.9

## 2024-06-24 HISTORY — DX: Gastro-esophageal reflux disease without esophagitis: K21.9

## 2024-06-25 ENCOUNTER — Inpatient Hospital Stay: Admission: RE | Admit: 2024-06-25 | Discharge: 2024-06-25

## 2024-06-25 DIAGNOSIS — E1159 Type 2 diabetes mellitus with other circulatory complications: Secondary | ICD-10-CM | POA: Insufficient documentation

## 2024-06-25 DIAGNOSIS — E1122 Type 2 diabetes mellitus with diabetic chronic kidney disease: Secondary | ICD-10-CM | POA: Insufficient documentation

## 2024-06-25 DIAGNOSIS — Z01818 Encounter for other preprocedural examination: Secondary | ICD-10-CM | POA: Insufficient documentation

## 2024-06-25 DIAGNOSIS — E119 Type 2 diabetes mellitus without complications: Secondary | ICD-10-CM

## 2024-06-25 DIAGNOSIS — Z01810 Encounter for preprocedural cardiovascular examination: Secondary | ICD-10-CM

## 2024-06-25 DIAGNOSIS — I152 Hypertension secondary to endocrine disorders: Secondary | ICD-10-CM | POA: Insufficient documentation

## 2024-06-25 DIAGNOSIS — N1831 Chronic kidney disease, stage 3a: Secondary | ICD-10-CM | POA: Insufficient documentation

## 2024-06-25 LAB — BASIC METABOLIC PANEL WITH GFR
Anion gap: 9 (ref 5–15)
BUN: 15 mg/dL (ref 8–23)
CO2: 30 mmol/L (ref 22–32)
Calcium: 9.8 mg/dL (ref 8.9–10.3)
Chloride: 103 mmol/L (ref 98–111)
Creatinine, Ser: 1.1 mg/dL — ABNORMAL HIGH (ref 0.44–1.00)
GFR, Estimated: 56 mL/min — ABNORMAL LOW
Glucose, Bld: 89 mg/dL (ref 70–99)
Potassium: 4 mmol/L (ref 3.5–5.1)
Sodium: 142 mmol/L (ref 135–145)

## 2024-06-25 LAB — CBC
HCT: 33.5 % — ABNORMAL LOW (ref 36.0–46.0)
Hemoglobin: 11 g/dL — ABNORMAL LOW (ref 12.0–15.0)
MCH: 29.1 pg (ref 26.0–34.0)
MCHC: 32.8 g/dL (ref 30.0–36.0)
MCV: 88.6 fL (ref 80.0–100.0)
Platelets: 189 10*3/uL (ref 150–400)
RBC: 3.78 MIL/uL — ABNORMAL LOW (ref 3.87–5.11)
RDW: 15 % (ref 11.5–15.5)
WBC: 5.1 10*3/uL (ref 4.0–10.5)
nRBC: 0 % (ref 0.0–0.2)

## 2024-07-01 ENCOUNTER — Ambulatory Visit: Admission: RE | Admit: 2024-07-01

## 2024-07-01 ENCOUNTER — Encounter: Admission: RE | Payer: Self-pay | Source: Home / Self Care

## 2024-07-02 ENCOUNTER — Ambulatory Visit: Admitting: Podiatry

## 2024-07-29 ENCOUNTER — Ambulatory Visit: Admitting: Obstetrics

## 2024-08-06 ENCOUNTER — Ambulatory Visit

## 2024-08-19 ENCOUNTER — Encounter: Admitting: Family Medicine
